# Patient Record
Sex: Female | Born: 1965 | Race: White | Hispanic: No | Marital: Single | Smoking: Never smoker
Health system: Southern US, Community
[De-identification: ages and names within clinical notes are randomized; demographics above are authoritative.]

## PROBLEM LIST (undated history)

## (undated) DIAGNOSIS — M51369 Other intervertebral disc degeneration, lumbar region without mention of lumbar back pain or lower extremity pain: Secondary | ICD-10-CM

## (undated) DIAGNOSIS — E785 Hyperlipidemia, unspecified: Secondary | ICD-10-CM

## (undated) DIAGNOSIS — L88 Pyoderma gangrenosum: Secondary | ICD-10-CM

## (undated) DIAGNOSIS — R011 Cardiac murmur, unspecified: Secondary | ICD-10-CM

## (undated) DIAGNOSIS — L080 Pyoderma: Secondary | ICD-10-CM

## (undated) DIAGNOSIS — M5136 Other intervertebral disc degeneration, lumbar region: Secondary | ICD-10-CM

## (undated) HISTORY — DX: Pyoderma gangrenosum: L88

## (undated) HISTORY — PX: POLYPECTOMY: SHX149

## (undated) HISTORY — DX: Other intervertebral disc degeneration, lumbar region without mention of lumbar back pain or lower extremity pain: M51.369

## (undated) HISTORY — DX: Pyoderma: L08.0

## (undated) HISTORY — PX: ABDOMINAL HERNIA REPAIR: SHX539

## (undated) HISTORY — PX: TONSILLECTOMY: SUR1361

## (undated) HISTORY — DX: Cardiac murmur, unspecified: R01.1

## (undated) HISTORY — DX: Hyperlipidemia, unspecified: E78.5

## (undated) HISTORY — DX: Other intervertebral disc degeneration, lumbar region: M51.36

---

## 2000-04-14 ENCOUNTER — Encounter: Payer: Self-pay | Admitting: Obstetrics and Gynecology

## 2000-04-14 ENCOUNTER — Ambulatory Visit (HOSPITAL_COMMUNITY): Admission: RE | Admit: 2000-04-14 | Discharge: 2000-04-14 | Payer: Self-pay | Admitting: Obstetrics and Gynecology

## 2000-05-31 ENCOUNTER — Other Ambulatory Visit: Admission: RE | Admit: 2000-05-31 | Discharge: 2000-05-31 | Payer: Self-pay | Admitting: Obstetrics and Gynecology

## 2000-06-05 ENCOUNTER — Inpatient Hospital Stay (HOSPITAL_COMMUNITY): Admission: RE | Admit: 2000-06-05 | Discharge: 2000-06-05 | Payer: Self-pay | Admitting: *Deleted

## 2000-10-19 ENCOUNTER — Inpatient Hospital Stay (HOSPITAL_COMMUNITY): Admission: AD | Admit: 2000-10-19 | Discharge: 2000-10-19 | Payer: Self-pay | Admitting: Obstetrics and Gynecology

## 2000-10-20 ENCOUNTER — Inpatient Hospital Stay (HOSPITAL_COMMUNITY): Admission: AD | Admit: 2000-10-20 | Discharge: 2000-10-20 | Payer: Self-pay | Admitting: Obstetrics and Gynecology

## 2000-11-08 ENCOUNTER — Other Ambulatory Visit: Admission: RE | Admit: 2000-11-08 | Discharge: 2000-11-08 | Payer: Self-pay | Admitting: Obstetrics and Gynecology

## 2000-11-17 ENCOUNTER — Inpatient Hospital Stay (HOSPITAL_COMMUNITY): Admission: AD | Admit: 2000-11-17 | Discharge: 2000-11-17 | Payer: Self-pay | Admitting: Obstetrics & Gynecology

## 2000-12-20 ENCOUNTER — Inpatient Hospital Stay (HOSPITAL_COMMUNITY): Admission: AD | Admit: 2000-12-20 | Discharge: 2000-12-22 | Payer: Self-pay | Admitting: Obstetrics and Gynecology

## 2000-12-23 ENCOUNTER — Encounter: Admission: RE | Admit: 2000-12-23 | Discharge: 2001-01-22 | Payer: Self-pay | Admitting: Obstetrics and Gynecology

## 2001-01-27 ENCOUNTER — Other Ambulatory Visit: Admission: RE | Admit: 2001-01-27 | Discharge: 2001-01-27 | Payer: Self-pay | Admitting: Obstetrics and Gynecology

## 2001-06-17 ENCOUNTER — Other Ambulatory Visit: Admission: RE | Admit: 2001-06-17 | Discharge: 2001-06-17 | Payer: Self-pay | Admitting: Obstetrics and Gynecology

## 2001-12-30 ENCOUNTER — Other Ambulatory Visit: Admission: RE | Admit: 2001-12-30 | Discharge: 2001-12-30 | Payer: Self-pay | Admitting: Obstetrics and Gynecology

## 2002-12-22 ENCOUNTER — Encounter: Payer: Self-pay | Admitting: Family Medicine

## 2002-12-22 ENCOUNTER — Encounter: Admission: RE | Admit: 2002-12-22 | Discharge: 2002-12-22 | Payer: Self-pay | Admitting: Family Medicine

## 2002-12-27 ENCOUNTER — Other Ambulatory Visit: Admission: RE | Admit: 2002-12-27 | Discharge: 2002-12-27 | Payer: Self-pay | Admitting: Obstetrics and Gynecology

## 2003-02-13 ENCOUNTER — Encounter: Admission: RE | Admit: 2003-02-13 | Discharge: 2003-02-13 | Payer: Self-pay | Admitting: Obstetrics and Gynecology

## 2003-10-26 ENCOUNTER — Emergency Department (HOSPITAL_COMMUNITY): Admission: EM | Admit: 2003-10-26 | Discharge: 2003-10-26 | Payer: Self-pay | Admitting: Emergency Medicine

## 2004-01-02 ENCOUNTER — Encounter: Admission: RE | Admit: 2004-01-02 | Discharge: 2004-01-02 | Payer: Self-pay | Admitting: Obstetrics and Gynecology

## 2004-10-14 ENCOUNTER — Ambulatory Visit: Payer: Self-pay | Admitting: Gastroenterology

## 2004-11-18 ENCOUNTER — Ambulatory Visit: Payer: Self-pay | Admitting: Gastroenterology

## 2004-12-22 ENCOUNTER — Ambulatory Visit: Payer: Self-pay | Admitting: Gastroenterology

## 2005-01-13 ENCOUNTER — Ambulatory Visit: Payer: Self-pay | Admitting: Gastroenterology

## 2005-03-12 ENCOUNTER — Ambulatory Visit: Payer: Self-pay | Admitting: Gastroenterology

## 2005-03-19 ENCOUNTER — Ambulatory Visit: Payer: Self-pay | Admitting: Gastroenterology

## 2005-04-15 ENCOUNTER — Ambulatory Visit: Payer: Self-pay | Admitting: Gastroenterology

## 2005-06-10 ENCOUNTER — Ambulatory Visit: Payer: Self-pay | Admitting: Gastroenterology

## 2005-09-02 ENCOUNTER — Ambulatory Visit: Payer: Self-pay | Admitting: Gastroenterology

## 2005-11-25 ENCOUNTER — Ambulatory Visit: Payer: Self-pay | Admitting: Gastroenterology

## 2006-02-17 ENCOUNTER — Ambulatory Visit: Payer: Self-pay | Admitting: Gastroenterology

## 2006-05-18 ENCOUNTER — Ambulatory Visit: Payer: Self-pay | Admitting: Gastroenterology

## 2006-06-01 ENCOUNTER — Ambulatory Visit: Payer: Self-pay | Admitting: Gastroenterology

## 2006-08-10 ENCOUNTER — Ambulatory Visit: Payer: Self-pay | Admitting: Gastroenterology

## 2006-08-19 ENCOUNTER — Ambulatory Visit: Payer: Self-pay | Admitting: Gastroenterology

## 2006-08-19 ENCOUNTER — Encounter: Payer: Self-pay | Admitting: Gastroenterology

## 2006-09-06 ENCOUNTER — Ambulatory Visit: Payer: Self-pay | Admitting: Gastroenterology

## 2006-09-20 ENCOUNTER — Ambulatory Visit: Payer: Self-pay | Admitting: Gastroenterology

## 2006-10-07 ENCOUNTER — Ambulatory Visit: Payer: Self-pay | Admitting: Gastroenterology

## 2006-10-15 ENCOUNTER — Ambulatory Visit: Payer: Self-pay | Admitting: Gastroenterology

## 2007-08-10 ENCOUNTER — Telehealth: Payer: Self-pay | Admitting: Gastroenterology

## 2007-12-31 ENCOUNTER — Emergency Department (HOSPITAL_COMMUNITY): Admission: EM | Admit: 2007-12-31 | Discharge: 2007-12-31 | Payer: Self-pay | Admitting: Emergency Medicine

## 2008-06-26 ENCOUNTER — Telehealth: Payer: Self-pay | Admitting: Gastroenterology

## 2008-08-15 ENCOUNTER — Encounter: Admission: RE | Admit: 2008-08-15 | Discharge: 2008-08-15 | Payer: Self-pay | Admitting: Obstetrics and Gynecology

## 2008-08-15 DIAGNOSIS — K5150 Left sided colitis without complications: Secondary | ICD-10-CM | POA: Insufficient documentation

## 2008-08-16 ENCOUNTER — Ambulatory Visit: Payer: Self-pay | Admitting: Gastroenterology

## 2008-08-16 LAB — CONVERTED CEMR LAB
AST: 20 units/L (ref 0–37)
Albumin: 4.2 g/dL (ref 3.5–5.2)
Alkaline Phosphatase: 47 units/L (ref 39–117)
BUN: 13 mg/dL (ref 6–23)
Basophils Relative: 1.1 % (ref 0.0–3.0)
Calcium: 9 mg/dL (ref 8.4–10.5)
Creatinine, Ser: 0.6 mg/dL (ref 0.4–1.2)
Eosinophils Absolute: 0.1 10*3/uL (ref 0.0–0.7)
Eosinophils Relative: 2.1 % (ref 0.0–5.0)
Glucose, Bld: 78 mg/dL (ref 70–99)
HCT: 39.9 % (ref 36.0–46.0)
Hemoglobin: 13.9 g/dL (ref 12.0–15.0)
MCHC: 34.7 g/dL (ref 30.0–36.0)
MCV: 89.4 fL (ref 78.0–100.0)
Monocytes Absolute: 0.3 10*3/uL (ref 0.1–1.0)
Neutro Abs: 4.3 10*3/uL (ref 1.4–7.7)
Potassium: 4 meq/L (ref 3.5–5.1)
RBC: 4.46 M/uL (ref 3.87–5.11)
WBC: 5.8 10*3/uL (ref 4.5–10.5)

## 2008-08-17 ENCOUNTER — Encounter: Payer: Self-pay | Admitting: Gastroenterology

## 2008-09-19 ENCOUNTER — Encounter (INDEPENDENT_AMBULATORY_CARE_PROVIDER_SITE_OTHER): Payer: Self-pay | Admitting: *Deleted

## 2008-09-19 ENCOUNTER — Ambulatory Visit (HOSPITAL_COMMUNITY): Admission: RE | Admit: 2008-09-19 | Discharge: 2008-09-19 | Payer: Self-pay | Admitting: Family Medicine

## 2008-11-21 ENCOUNTER — Telehealth: Payer: Self-pay | Admitting: Gastroenterology

## 2008-11-26 ENCOUNTER — Telehealth: Payer: Self-pay | Admitting: Gastroenterology

## 2008-12-04 ENCOUNTER — Telehealth: Payer: Self-pay | Admitting: Gastroenterology

## 2009-02-05 ENCOUNTER — Ambulatory Visit: Payer: Self-pay | Admitting: Gastroenterology

## 2009-02-06 ENCOUNTER — Encounter: Payer: Self-pay | Admitting: Gastroenterology

## 2009-02-14 ENCOUNTER — Telehealth: Payer: Self-pay | Admitting: Gastroenterology

## 2009-02-27 ENCOUNTER — Ambulatory Visit: Payer: Self-pay | Admitting: Gastroenterology

## 2009-03-04 ENCOUNTER — Telehealth: Payer: Self-pay | Admitting: Gastroenterology

## 2009-03-05 ENCOUNTER — Ambulatory Visit: Payer: Self-pay | Admitting: Gastroenterology

## 2009-03-06 ENCOUNTER — Encounter (INDEPENDENT_AMBULATORY_CARE_PROVIDER_SITE_OTHER): Payer: Self-pay | Admitting: *Deleted

## 2009-03-11 ENCOUNTER — Telehealth: Payer: Self-pay | Admitting: Gastroenterology

## 2009-03-27 ENCOUNTER — Telehealth: Payer: Self-pay | Admitting: Gastroenterology

## 2009-03-27 ENCOUNTER — Ambulatory Visit: Payer: Self-pay | Admitting: Gastroenterology

## 2009-03-27 LAB — CONVERTED CEMR LAB
Albumin: 4 g/dL (ref 3.5–5.2)
Alkaline Phosphatase: 38 units/L — ABNORMAL LOW (ref 39–117)
Basophils Absolute: 0 10*3/uL (ref 0.0–0.1)
CO2: 29 meq/L (ref 19–32)
Chloride: 104 meq/L (ref 96–112)
Creatinine, Ser: 0.7 mg/dL (ref 0.4–1.2)
Eosinophils Absolute: 0.1 10*3/uL (ref 0.0–0.7)
Glucose, Bld: 95 mg/dL (ref 70–99)
Hemoglobin: 13.6 g/dL (ref 12.0–15.0)
Lipase: 16 units/L (ref 11.0–59.0)
Lymphocytes Relative: 14.4 % (ref 12.0–46.0)
MCHC: 34.1 g/dL (ref 30.0–36.0)
Monocytes Relative: 5.8 % (ref 3.0–12.0)
Neutro Abs: 6.4 10*3/uL (ref 1.4–7.7)
Neutrophils Relative %: 77.7 % — ABNORMAL HIGH (ref 43.0–77.0)
Platelets: 252 10*3/uL (ref 150.0–400.0)
RDW: 12.3 % (ref 11.5–14.6)
Total Bilirubin: 0.8 mg/dL (ref 0.3–1.2)

## 2009-03-28 ENCOUNTER — Ambulatory Visit: Payer: Self-pay | Admitting: Gastroenterology

## 2009-05-09 ENCOUNTER — Ambulatory Visit: Payer: Self-pay | Admitting: Gastroenterology

## 2009-05-31 ENCOUNTER — Telehealth: Payer: Self-pay | Admitting: Gastroenterology

## 2009-06-14 ENCOUNTER — Telehealth: Payer: Self-pay | Admitting: Gastroenterology

## 2009-06-18 ENCOUNTER — Ambulatory Visit: Payer: Self-pay | Admitting: Gastroenterology

## 2009-06-18 LAB — CONVERTED CEMR LAB
ALT: 11 units/L (ref 0–35)
AST: 17 units/L (ref 0–37)
Albumin: 4.1 g/dL (ref 3.5–5.2)
Eosinophils Relative: 1.8 % (ref 0.0–5.0)
HCT: 39.2 % (ref 36.0–46.0)
Hemoglobin: 13.8 g/dL (ref 12.0–15.0)
Lymphs Abs: 1 10*3/uL (ref 0.7–4.0)
MCV: 91.6 fL (ref 78.0–100.0)
Monocytes Relative: 6.5 % (ref 3.0–12.0)
Neutro Abs: 3.5 10*3/uL (ref 1.4–7.7)
Total Protein: 7.2 g/dL (ref 6.0–8.3)
WBC: 4.9 10*3/uL (ref 4.5–10.5)

## 2009-07-24 ENCOUNTER — Encounter (INDEPENDENT_AMBULATORY_CARE_PROVIDER_SITE_OTHER): Payer: Self-pay | Admitting: *Deleted

## 2009-08-28 ENCOUNTER — Ambulatory Visit: Payer: Self-pay | Admitting: Gastroenterology

## 2009-09-06 ENCOUNTER — Encounter: Admission: RE | Admit: 2009-09-06 | Discharge: 2009-09-06 | Payer: Self-pay | Admitting: Obstetrics and Gynecology

## 2009-10-14 ENCOUNTER — Telehealth: Payer: Self-pay | Admitting: Gastroenterology

## 2009-10-15 ENCOUNTER — Ambulatory Visit: Payer: Self-pay | Admitting: Gastroenterology

## 2009-10-15 LAB — CONVERTED CEMR LAB
Basophils Relative: 0.8 % (ref 0.0–3.0)
Eosinophils Relative: 2 % (ref 0.0–5.0)
HCT: 39.5 % (ref 36.0–46.0)
Hemoglobin: 13.7 g/dL (ref 12.0–15.0)
Lymphs Abs: 0.7 10*3/uL (ref 0.7–4.0)
MCV: 95.6 fL (ref 78.0–100.0)
Monocytes Absolute: 0.3 10*3/uL (ref 0.1–1.0)
Neutrophils Relative %: 72.8 % (ref 43.0–77.0)
RBC: 4.13 M/uL (ref 3.87–5.11)
Total Bilirubin: 0.8 mg/dL (ref 0.3–1.2)
WBC: 4.3 10*3/uL — ABNORMAL LOW (ref 4.5–10.5)

## 2009-12-23 ENCOUNTER — Telehealth: Payer: Self-pay | Admitting: Gastroenterology

## 2010-02-13 ENCOUNTER — Telehealth: Payer: Self-pay | Admitting: Gastroenterology

## 2010-02-14 ENCOUNTER — Ambulatory Visit: Payer: Self-pay | Admitting: Gastroenterology

## 2010-02-14 LAB — CONVERTED CEMR LAB
Albumin: 4.2 g/dL (ref 3.5–5.2)
Basophils Relative: 1.1 % (ref 0.0–3.0)
Bilirubin, Direct: 0.1 mg/dL (ref 0.0–0.3)
Eosinophils Absolute: 0.1 10*3/uL (ref 0.0–0.7)
MCHC: 35.5 g/dL (ref 30.0–36.0)
MCV: 94.5 fL (ref 78.0–100.0)
Monocytes Absolute: 0.4 10*3/uL (ref 0.1–1.0)
Neutrophils Relative %: 66.2 % (ref 43.0–77.0)
RBC: 4.1 M/uL (ref 3.87–5.11)
Total Protein: 6.9 g/dL (ref 6.0–8.3)

## 2010-02-28 ENCOUNTER — Telehealth: Payer: Self-pay | Admitting: Gastroenterology

## 2010-03-03 ENCOUNTER — Ambulatory Visit: Payer: Self-pay | Admitting: Gastroenterology

## 2010-03-03 LAB — CONVERTED CEMR LAB
Total CHOL/HDL Ratio: 5
VLDL: 20 mg/dL (ref 0.0–40.0)

## 2010-03-11 ENCOUNTER — Telehealth: Payer: Self-pay | Admitting: Gastroenterology

## 2010-04-06 ENCOUNTER — Encounter: Payer: Self-pay | Admitting: Obstetrics and Gynecology

## 2010-04-13 LAB — CONVERTED CEMR LAB
ALT: 13 units/L (ref 0–35)
AST: 20 units/L (ref 0–37)
Albumin: 3.7 g/dL (ref 3.5–5.2)
Amylase: 155 units/L — ABNORMAL HIGH (ref 27–131)
BUN: 14 mg/dL (ref 6–23)
Basophils Relative: 0.5 % (ref 0.0–3.0)
Chloride: 102 meq/L (ref 96–112)
Eosinophils Relative: 1.4 % (ref 0.0–5.0)
GFR calc non Af Amer: 83.72 mL/min (ref >60)
HCT: 40.1 % (ref 36.0–46.0)
Hemoglobin: 13.7 g/dL (ref 12.0–15.0)
Lipase: 14 units/L (ref 11.0–59.0)
Lymphs Abs: 1.3 10*3/uL (ref 0.7–4.0)
MCV: 94.1 fL (ref 78.0–100.0)
Monocytes Absolute: 0.4 10*3/uL (ref 0.1–1.0)
Monocytes Relative: 3.2 % (ref 3.0–12.0)
Neutro Abs: 9.3 10*3/uL — ABNORMAL HIGH (ref 1.4–7.7)
Potassium: 3.7 meq/L (ref 3.5–5.1)
Sodium: 138 meq/L (ref 135–145)
Total Protein: 6.8 g/dL (ref 6.0–8.3)
WBC: 11.3 10*3/uL — ABNORMAL HIGH (ref 4.5–10.5)

## 2010-04-15 NOTE — Progress Notes (Signed)
Summary: 6 month labs due   Phone Note Outgoing Call Call back at [facility] Phone [phone]   Call placed by: Genella Mech CMA Deborra Medina),  February 13, 2010 3:14 PM Summary of Call: Called pt to inform that her 6 month labs are due. L/M for her to contact the office if she has any questions Initial call taken by: Genella Mech CMA ([place]),  February 13, 2010 3:15 PM     Appended Document: 6 month labs due ORDERS IN IDX

## 2010-04-15 NOTE — Letter (Signed)
Summary: Office Visit Letter  [facility]  [address] [place], [place] [address]   Phone: [phone]  Fax: [phone]      Jul 24, 2009 MRN: [id number]   [facility] [address] [place], [place]  [address]   Dear Ms. Lua,   According to our records, it is time for you to schedule a follow-up office visit with [place] in the month of June 2011.   At your convenience, please call [phone] (option #2)to schedule an office visit. If you have any questions, concerns, or feel that this letter is in error, we would appreciate your call.   Sincerely,  Sandy Salaam. Deatra Ina, M.D.   [facility] [phone]

## 2010-04-15 NOTE — Assessment & Plan Note (Signed)
Summary: FOLLOW UP APPT/YF    History of Present Illness Visit Type: Follow-up Visit Primary GI MD: Erskine Emery MD [facility] Primary Provider: Elson Clan, MD Requesting Provider: n/a Chief Complaint: Renew lialda, no colitis flare History of Present Illness:   [name] has returned for followup for left-sided ulcerative colitis.  On a regimen of 6 MP 75 mg daily and lialda 4.8 g daily she has remained in clinical remission.  Altogether she is feeling quite well.  Blood work including LFTs and CBC are normal.   GI Review of Systems      Denies abdominal pain, acid reflux, belching, bloating, chest pain, dysphagia with liquids, dysphagia with solids, heartburn, loss of appetite, nausea, vomiting, vomiting blood, weight loss, and  weight gain.        Denies anal fissure, black tarry stools, change in bowel habit, constipation, diarrhea, diverticulosis, fecal incontinence, heme positive stool, hemorrhoids, irritable bowel syndrome, jaundice, light color stool, liver problems, rectal bleeding, and  rectal pain.    Current Medications (verified): 1)  Lialda 1.2 Gm  Tbec (Mesalamine) .... Take 4  Caps Once Daily...must Have Office Visit Before Any More Refills!!! 2)  Nortrel 0.5/35 (28) 0.5-35 Mg-Mcg Tabs (Norethindrone-Eth Estradiol) .... One Tablet By Mouth Once Daily 3)  Tylenol 325 Mg Tabs (Acetaminophen) .... As Needed For Pain 4)  Prednisone 10 Mg Tabs (Prednisone) .... Take 4 Tablets Q.a.m With Food Tapering Down 5)  Mercaptopurine 50 Mg Tabs (Mercaptopurine) .Marland Kitchen.. 1 1/2 By Mouth Once Daily   (Has Increased To 48m )  Allergies (verified): No Known Drug Allergies  Past History:  Past Medical History: Reviewed history from 08/15/2008 and no changes required. Current Problems:  LEFT SIDED ULCERATIVE COLITIS (ICD-556.5)  Past Surgical History: Reviewed history from 08/16/2008 and no changes required. Tonsillectomy Hernia surgery  Family History: Reviewed history from  08/16/2008 and no changes required. Family History of Breast Cancer:Mother Family History of Colon Polyps:Mother No FH of Colon Cancer:  Social History: Reviewed history from 02/05/2009 and no changes required. 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  Alcohol Use - no Daily Caffeine Use Illicit Drug Use - no  Review of Systems       The patient complains of heart murmur.  The patient denies allergy/sinus, anemia, anxiety-new, arthritis/joint pain, back pain, blood in urine, breast changes/lumps, change in vision, confusion, cough, coughing up blood, depression-new, fainting, fatigue, fever, headaches-new, hearing problems, heart rhythm changes, itching, menstrual pain, muscle pains/cramps, night sweats, nosebleeds, pregnancy symptoms, shortness of breath, skin rash, sleeping problems, sore throat, swelling of feet/legs, swollen lymph glands, thirst - excessive , urination - excessive , urination changes/pain, urine leakage, vision changes, and voice change.    Vital Signs:  Patient profile:   45year old female Height:      64 inches Weight:      143.50 pounds BMI:     24.72 Pulse rate:   68 / minute Pulse rhythm:   regular BP sitting:   106 / 60  (left arm) Cuff size:   regular  Vitals Entered By: June McMurray CHarmon(Deborra Medina (August 28, 2009 3:18 PM)   Impression & Recommendations:  Problem # 1:  LEFT SIDED ULCERATIVE COLITIS (ICD-556.5) Assessment Improved She is in clinical remission on lialda and 6-MP.  Plan to continue current regimen.  Patient Instructions: 1)  Copy sent to : DElson Clan MD 2)  Your refills of Lialda will be sent to your pharmacy 3)  You will follow up labs  in Aug 2011 and again in Dec. 2011 4)  We will contact you with reminder appointments 5)  You will need to follow up again with Dr Deatra Ina in the office after December 6)  The medication list was reviewed and reconciled.  All changed / newly prescribed medications were explained.  A  complete medication list was provided to the patient / caregiver. Prescriptions: LIALDA 1.2 GM  TBEC (MESALAMINE) take 4  caps once daily...  #120 x 6   Entered by:   Genella Mech CMA ([place])   Authorized by:   Inda Castle MD   Signed by:   Genella Mech CMA ([place]) on 08/28/2009   Method used:   Electronically to        [facility] (retail)       [address], [place]  [address]       Ph: [phone]       Fax: [phone]   RxID:   [id number]

## 2010-04-15 NOTE — Assessment & Plan Note (Signed)
Summary: F/U APPT...LSW.    History of Present Illness Visit Type: Follow-up Visit Primary GI MD: Erskine Emery MD [facility] Primary Provider: Elson Clan, MD Requesting Provider: n/a Chief Complaint: follow-up pt. denies any problems at this time History of Present Illness:   Mrs. Wyrick has returned for followup of her left-sided ulcerative colitis.  6 MP 50 mg daily and lialda she has remained in complete remission.  She developed abdominal pain with Imuran.   GI Review of Systems      Denies abdominal pain, acid reflux, belching, bloating, chest pain, dysphagia with liquids, dysphagia with solids, heartburn, loss of appetite, nausea, vomiting, vomiting blood, weight loss, and  weight gain.        Denies anal fissure, black tarry stools, change in bowel habit, constipation, diarrhea, diverticulosis, fecal incontinence, heme positive stool, hemorrhoids, irritable bowel syndrome, jaundice, light color stool, liver problems, rectal bleeding, and  rectal pain.    Current Medications (verified): 1)  Lialda 1.2 Gm  Tbec (Mesalamine) .... Take 4  Caps Once Daily 2)  Nortrel 0.5/35 (28) 0.5-35 Mg-Mcg Tabs (Norethindrone-Eth Estradiol) .... One Tablet By Mouth Once Daily 3)  Tylenol 325 Mg Tabs (Acetaminophen) .... As Needed For Pain 4)  Prednisone 10 Mg Tabs (Prednisone) .... Take 4 Tablets Q.a.m With Food Tapering Down 5)  Imuran 50 Mg Tabs (Azathioprine) .... Take One Tab Daily (Holding) 6)  Mercaptopurine 50 Mg Tabs (Mercaptopurine) .... Date One Half Tablet Daily For One Week Then Increase To One Daily  Allergies (verified): No Known Drug Allergies  Past History:  Past Medical History: Reviewed history from 08/15/2008 and no changes required. Current Problems:  LEFT SIDED ULCERATIVE COLITIS (ICD-556.5)  Past Surgical History: Reviewed history from 08/16/2008 and no changes required. Tonsillectomy Hernia surgery  Family History: Reviewed history from 08/16/2008 and no changes  required. Family History of Breast Cancer:Mother Family History of Colon Polyps:Mother No FH of Colon Cancer:  Social History: Reviewed history from 02/05/2009 and no changes required. 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  Alcohol Use - no Daily Caffeine Use Illicit Drug Use - no  Review of Systems       The patient complains of fatigue.  The patient denies allergy/sinus, anemia, anxiety-new, arthritis/joint pain, back pain, blood in urine, breast changes/lumps, change in vision, confusion, cough, coughing up blood, depression-new, fainting, fever, headaches-new, hearing problems, heart murmur, heart rhythm changes, itching, menstrual pain, muscle pains/cramps, night sweats, nosebleeds, pregnancy symptoms, shortness of breath, skin rash, sleeping problems, sore throat, swelling of feet/legs, swollen lymph glands, thirst - excessive, urination - excessive, urination changes/pain, urine leakage, vision changes, and voice change.    Vital Signs:  Patient profile:   45 year old female Height:      64 inches Weight:      152 pounds BMI:     26.19 Pulse rate:   72 / minute Pulse rhythm:   regular BP sitting:   102 / 72  (right arm)  Vitals Entered By: Randye Lobo NCMA (May 09, 2009 9:09 AM)    Impression & Recommendations:  Problem # 1:  LEFT SIDED ULCERATIVE COLITIS (ICD-556.5) Assessment Improved Plan to increase 6-MP to 75 mg daily while continuing lialda

## 2010-04-15 NOTE — Progress Notes (Signed)
Summary: LABS DUE   Phone Note Outgoing Call Call back at [facility] Phone [phone]   Call placed by: Genella Mech CMA [facility]),  June 14, 2009 11:15 AM Summary of Call: CALLED PT TO INFORM LABS ARE DUE. ORDERS IN IDX Initial call taken by: Genella Mech CMA ([place]),  June 14, 2009 11:16 AM

## 2010-04-15 NOTE — Progress Notes (Signed)
Summary: Refill   Phone Note Refill Request Message from:  Fax from Pharmacy on December 23, 2009 3:47 PM  Refills Requested: Medication #1:  MERCAPTOPURINE 50 MG TABS 1 1/2 by mouth once daily   (has increased to 1m ).   Dosage confirmed as above?Dosage Confirmed   Brand Name Necessary? No   Supply Requested: 3 months Initial call taken by: RGenella MechCMA (Deborra Medina,  December 23, 2009 3:48 PM    Prescriptions: MERCAPTOPURINE 50 MG TABS (MERCAPTOPURINE) 1 1/2 by mouth once daily   (has increased to 726m)  #135 x 1   Entered by:   RoGenella MechMA (AAEug[place]  Authorized by:   RoInda CastleD   Signed by:   RoGenella MechMA (AAMexican Colonyon 12/23/2009   Method used:   Electronically to        [facility](retail)       [address]      [place]  [id number]     Ph: [phone]     Fax: [phone] RxID:   [id number]

## 2010-04-15 NOTE — Progress Notes (Signed)
Summary: Follow lab reminder   Phone Note Outgoing Call Call back at Home Phone [phone]   Call placed by: Genella Mech CMA Deborra Medina),  October 14, 2009 10:52 AM Summary of Call: Called pt to inform that follow up labs are due, CBC and LFT's. Orders in [place]. L/M for pt. Initial call taken by: Genella Mech CMA [facility]),  October 14, 2009 10:52 AM

## 2010-04-15 NOTE — Assessment & Plan Note (Signed)
Summary: F/U FROM TRIAGE ON 03-04-09 AND RECENT LABS              [place]    History of Present Illness Visit Type: Follow-up Visit Primary GI MD: Erskine Emery MD [facility] Primary Provider: Elson Clan, MD Requesting Provider: n/a Chief Complaint: follow-up labs Pt. is better at this time History of Present Illness:   Ms. Camplin has returned for followup of her left-sided colitis.  She developed upper abdominal pain with Imuran and had to discontinue this.  She has continued her steroid taper and her colitis has remained in remission.  her TPMT. phenotype low normal.   GI Review of Systems      Denies abdominal pain, acid reflux, belching, bloating, chest pain, dysphagia with liquids, dysphagia with solids, heartburn, loss of appetite, nausea, vomiting, vomiting blood, weight loss, and  weight gain.        Denies anal fissure, black tarry stools, change in bowel habit, constipation, diarrhea, diverticulosis, fecal incontinence, heme positive stool, hemorrhoids, irritable bowel syndrome, jaundice, light color stool, liver problems, rectal bleeding, and  rectal pain.    Current Medications (verified): 1)  Lialda 1.2 Gm  Tbec (Mesalamine) .... Take 4  Caps Once Daily 2)  Nortrel 0.5/35 (28) 0.5-35 Mg-Mcg Tabs (Norethindrone-Eth Estradiol) .... One Tablet By Mouth Once Daily 3)  Tylenol 325 Mg Tabs (Acetaminophen) .... As Needed For Pain 4)  Prednisone 10 Mg Tabs (Prednisone) .... Take 4 Tablets Q.a.m With Food Tapering Down 5)  Imuran 50 Mg Tabs (Azathioprine) .... Take One Tab Daily (Holding)  Allergies (verified): No Known Drug Allergies  Past History:  Past Medical History: Reviewed history from 08/15/2008 and no changes required. Current Problems:  LEFT SIDED ULCERATIVE COLITIS (ICD-556.5)  Past Surgical History: Reviewed history from 08/16/2008 and no changes required. Tonsillectomy Hernia surgery  Family History: Reviewed history from 08/16/2008 and no changes  required. Family History of Breast Cancer:Mother Family History of Colon Polyps:Mother No FH of Colon Cancer:  Social History: Reviewed history from 02/05/2009 and no changes required. 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  Alcohol Use - no Daily Caffeine Use Illicit Drug Use - no  Review of Systems  The patient denies allergy/sinus, anemia, anxiety-new, arthritis/joint pain, back pain, blood in urine, breast changes/lumps, change in vision, confusion, cough, coughing up blood, depression-new, fainting, fatigue, fever, headaches-new, hearing problems, heart murmur, heart rhythm changes, itching, menstrual pain, muscle pains/cramps, night sweats, nosebleeds, pregnancy symptoms, shortness of breath, skin rash, sleeping problems, sore throat, swelling of feet/legs, swollen lymph glands, thirst - excessive , urination - excessive , urination changes/pain, urine leakage, vision changes, and voice change.    Vital Signs:  Patient profile:   45 year old female Height:      64 inches Weight:      149.38 pounds BMI:     25.73 Pulse rate:   80 / minute Pulse rhythm:   regular BP sitting:   108 / 68  (left arm)  Vitals Entered By: Randye Lobo [place] (March 28, 2009 10:30 AM)   Impression & Recommendations:  Problem # 1:  LEFT SIDED ULCERATIVE COLITIS (ICD-556.5) Assessment Improved Patient is intolerant of Imuran(abdominal pain).  Plan to start 6-MP 25 mg and increase it to 50 mg after one week provided that she tolerates this.  Failing  this, I would start her on Remicade.  She will continue lialda.  Patient Instructions: 1)  CC Dr. Elson Clan Prescriptions: MERCAPTOPURINE 50 MG TABS (MERCAPTOPURINE) date one  half tablet daily for one week then increase to one daily  #25 x 5   Entered and Authorized by:   Inda Castle MD   Signed by:   Inda Castle MD on 03/28/2009   Method used:   Electronically to        [facility] (retail)        [address]       [place]       [place], [place]  [address]       Ph: [phone]       Fax: [phone]   RxID:   [id number]

## 2010-04-15 NOTE — Progress Notes (Signed)
Summary: 6MP refill request  Medications Added MERCAPTOPURINE 50 MG TABS (MERCAPTOPURINE) 1 1/2 by mouth once daily   (has increased to 11m )       Phone Note Refill Request Message from:  Fax from Pharmacy on May 31, 2009 8:33 AM  Refills Requested: Medication #1:  MERCAPTOPURINE 50 MG TABS date one half tablet daily for one week then increase to one daily.   Dosage confirmed as above?Dosage Confirmed   Brand Name Necessary? No   Supply Requested: 6 months  Method Requested: Electronic Initial call taken by: RGenella MechCMA (Deborra Medina,  May 31, 2009 8:33 AM    New/Updated Medications: MERCAPTOPURINE 50 MG TABS (MERCAPTOPURINE) 1 1/2 by mouth once daily   (has increased to 753m) Prescriptions: MERCAPTOPURINE 50 MG TABS (MERCAPTOPURINE) 1 1/2 by mouth once daily   (has increased to 7559m  #45 x 6   Entered by:   RobGenella MechA (AAMS[place] Authorized by:   RobInda Castle   Signed by:   RobGenella MechA (AAMFessendenn 05/31/2009   Method used:   Electronically to        [facility]       [address]     [place]  [id number]    Ph: [phone]    Fax: [phone]:   [id number]

## 2010-04-15 NOTE — Progress Notes (Signed)
Summary: Follow up labs   Phone Note Outgoing Call Call back at Home Phone [phone]   Call placed by: Genella Mech CMA Deborra Medina),  March 27, 2009 12:15 PM Summary of Call: Called pt to remind her to have her follow up labs drawn before her appointment on the 13th. Orders in [place] Initial call taken by: Genella Mech CMA Deborra Medina),  March 27, 2009 12:15 PM

## 2010-04-17 NOTE — Progress Notes (Signed)
Summary: Results   Phone Note Call from Patient Call back at [phone]   Reason for Call: Talk to Nurse Summary of Call: Pt wants to speak with Robin about lab results Initial call taken by: Martinique Johnson,  February 28, 2010 10:41 AM  Follow-up for Phone Call        [place] over normal lab results with pt. Pt wants to have a lipid panel drawn, she had elevated cholesterol during her medical insurance exam. She has never had high cholesterol before. Wants to know if Dr Deatra Ina would mind putting in an order for her...  Dr Deatra Ina, FYI-I put an order in [place] for her to come back to the lab to have her Fasting Lipid panel drawn.  She wants call back with results.. Follow-up by: Genella Mech CMA Deborra Medina),  February 28, 2010 10:51 AM  Additional Follow-up for Phone Call Additional follow up Details #1::        ok Additional Follow-up by: Inda Castle MD,  March 03, 2010 10:07 AM

## 2010-04-17 NOTE — Progress Notes (Signed)
Summary: results   Phone Note Call from Patient Call back at [phone]   Caller: Patient Call For: Dr Deatra Ina Reason for Call: Lab or Test Results Summary of Call: Lab results Initial call taken by: Ronalee Red,  March 11, 2010 10:16 AM  Follow-up for Phone Call        Patient is advised of cholesterol  lab results. Rosanne Sack RN  March 11, 2010 2:24 PM

## 2010-04-22 ENCOUNTER — Encounter: Payer: Self-pay | Admitting: Gastroenterology

## 2010-05-01 NOTE — Medication Information (Signed)
Summary: Doristine Johns / [place]  Lialda / [place]   Imported By: Rise Patience 04/25/2010 16:19:00  _____________________________________________________________________  External Attachment:    Type:   Image     Comment:   External Document

## 2010-07-28 ENCOUNTER — Telehealth: Payer: Self-pay | Admitting: Gastroenterology

## 2010-07-28 MED ORDER — ROWASA 4 G RE KIT: 1.0000 | PACK | Freq: Every evening | RECTAL | Status: DC | PRN

## 2010-07-28 MED ORDER — HYDROCORTISONE ACE-PRAMOXINE 1-1 % RE FOAM: 1.0000 | RECTAL | Status: DC

## 2010-07-28 NOTE — Telephone Encounter (Signed)
The patient has left-sided ulcerative colitis. This is probably a mild flare.  To her regimen and Rowasa enemas one each bedtime and ProctoFoam one every morning. Have patient call back in 4 days to report her progress. Office visit next 2-3 weeks

## 2010-07-28 NOTE — Telephone Encounter (Signed)
Pt aware of Dr. Kelby Fam recommendations and prescriptions sent to the pharmacy. Pt scheduled to see Dr. Deatra Ina 08/14/10@2 :30pm.

## 2010-07-28 NOTE — Telephone Encounter (Signed)
Pt states that she has history of ulcerative colitis. She is currently taking lialda and 15m and states she was in remission. Pt states that last week she started having some bleeding and she is now having more frequent bowel movements with some mucous and blood in them. She is also cramping. Dr. KDeatra Inaplease advise.

## 2010-07-28 NOTE — Telephone Encounter (Signed)
Addended by: Algernon Huxley. on: 07/28/2010 04:48 PM   Modules accepted: Orders

## 2010-08-01 NOTE — H&P (Signed)
[facility]  Patient:    [name], [name] Visit Number: [id number] MRN: [id number]          Service Type: Attending:  Lovenia Kim, M.D. Dictated by:   Lovenia Kim, M.D. Adm. Date:  12/20/00   CC:         [facility]   History and Physical  CHIEF COMPLAINT:              Spontaneous rupture of membranes and early labor at 36 weeks.  HISTORY OF PRESENT ILLNESS:   The patient is a 45 year old white female, G2, P67, EDD January 15, 2001, at 36+ weeks, who presents in spontaneous labor.  ALLERGIES:                    CODEINE.  MEDICATIONS:                  Prenatal vitamins.  GYNECOLOGICAL/OBSTETRICAL HISTORY:                      Previous vaginal delivery in 1998 of a 6 pound 10 ounce female.  History of LEEP in August 2000.  Pregnancy complicated by preterm cervical change, on terbutaline tocolysis up until 36 weeks.  PRENATAL LABORATORY DATA:     Blood type A negative.  Rubella immune. Hepatitis B surface antigen negative.  HIV nonreactive.  GBS reported is negative.  FAMILY HISTORY:               Remarkable for hypertension, DVT, emphysema.  PAST MEDICAL HISTORY:         History of pyelonephritis at age 89, tonsillectomy at age 73, hernia repair at age 62.  Previous history of mitral valve prolapse; no history of premedication prior to vaginal delivery.  PHYSICAL EXAMINATION:  GENERAL:                      She is a well-developed, well-nourished white female in no apparent distress.  HEENT:                        Normal.  LUNGS:                        Clear.  HEART:                        Regular rate and rhythm.  ABDOMEN:                      Soft, nontender, estimated fetal weight is 6 to 6-1/2 pounds.  PELVIC:                       Cervix is 2-3 cm, 80%, vertex -2.  EXTREMITIES:                  Reveals no cords.  NEUROLOGIC EXAM:              Nonfocal.  IMPRESSION:                   1. A 36-week intrauterine  pregnancy                               2. Spontaneous rupture of membranes.  PLAN:  Anticipate attempts at vaginal delivery. Dictated by:   Lovenia Kim, M.D. Attending:  Lovenia Kim, M.D. DD:  12/20/00 TD:  12/20/00 Job: [id number]

## 2010-08-05 ENCOUNTER — Other Ambulatory Visit: Payer: Self-pay | Admitting: Obstetrics and Gynecology

## 2010-08-05 DIAGNOSIS — Z1231 Encounter for screening mammogram for malignant neoplasm of breast: Secondary | ICD-10-CM

## 2010-08-14 ENCOUNTER — Ambulatory Visit (INDEPENDENT_AMBULATORY_CARE_PROVIDER_SITE_OTHER): Payer: 59 | Admitting: Gastroenterology

## 2010-08-14 ENCOUNTER — Other Ambulatory Visit (INDEPENDENT_AMBULATORY_CARE_PROVIDER_SITE_OTHER): Payer: 59

## 2010-08-14 ENCOUNTER — Encounter: Payer: Self-pay | Admitting: Gastroenterology

## 2010-08-14 DIAGNOSIS — K5190 Ulcerative colitis, unspecified, without complications: Secondary | ICD-10-CM

## 2010-08-14 DIAGNOSIS — K5150 Left sided colitis without complications: Secondary | ICD-10-CM

## 2010-08-14 LAB — HEPATIC FUNCTION PANEL
ALT: 13 U/L (ref 0–35)
AST: 13 U/L (ref 0–37)
Alkaline Phosphatase: 39 U/L (ref 39–117)
Bilirubin, Direct: 0.1 mg/dL (ref 0.0–0.3)
Total Protein: 6.9 g/dL (ref 6.0–8.3)

## 2010-08-14 LAB — CBC WITH DIFFERENTIAL/PLATELET
Basophils Relative: 1.3 % (ref 0.0–3.0)
Eosinophils Absolute: 0.1 10*3/uL (ref 0.0–0.7)
Eosinophils Relative: 1.6 % (ref 0.0–5.0)
Hemoglobin: 13 g/dL (ref 12.0–15.0)
Lymphocytes Relative: 19 % (ref 12.0–46.0)
Monocytes Relative: 6.9 % (ref 3.0–12.0)
Neutro Abs: 4.1 10*3/uL (ref 1.4–7.7)
Neutrophils Relative %: 71.2 % (ref 43.0–77.0)
RBC: 3.98 Mil/uL (ref 3.87–5.11)
WBC: 5.8 10*3/uL (ref 4.5–10.5)

## 2010-08-14 NOTE — Progress Notes (Signed)
History of Present Illness:  Ms. Nunley is a 45 year old white female with left-sided ulcer colitis on 6-MP and lialda who is returning for followup of a recent flare. Approximately 2 weeks ago she developed increasing diarrhea with little bleeding. Rowasa and ProctoFoam enemas were added. Symptoms have since quieted and she's back to her baseline. She is without pain, diarrhea or bleeding.    Review of Systems: Pertinent positive and negative review of systems were noted in the above HPI section. All other review of systems were otherwise negative.    Current Medications, Allergies, Past Medical History, Past Surgical History, Family History and Social History were reviewed in [place] record  Vital signs were reviewed in today's medical record. Physical Exam: General: Well developed , well nourished, no acute distress Head: Normocephalic and atraumatic Eyes:  sclerae anicteric, EOMI Ears: Normal auditory acuity Mouth: No deformity or lesions Lungs: Clear throughout to auscultation Heart: Regular rate and rhythm; no murmurs, rubs or bruits Abdomen: Soft, non tender and non distended. No masses, hepatosplenomegaly or hernias noted. Normal Bowel sounds Rectal:deferred Musculoskeletal: Symmetrical with no gross deformities  Pulses:  Normal pulses noted Extremities: No clubbing, cyanosis, edema or deformities noted Neurological: Alert oriented x 4, grossly nonfocal Psychological:  Alert and cooperative. Normal mood and affect

## 2010-08-14 NOTE — Patient Instructions (Signed)
You will have labs today and in 6 months Please schedule a follow up appointment for 1 year

## 2010-08-14 NOTE — Assessment & Plan Note (Signed)
Stable left-sided colitis with recent flare responsive to Rowasa and ProctoFoam enemas.  Recommendations #1 continue lialda and 6-MP #2 check CBC and LFTs every 6 months

## 2010-08-27 ENCOUNTER — Other Ambulatory Visit: Payer: Self-pay | Admitting: Obstetrics and Gynecology

## 2010-08-27 DIAGNOSIS — N6320 Unspecified lump in the left breast, unspecified quadrant: Secondary | ICD-10-CM

## 2010-09-03 ENCOUNTER — Ambulatory Visit
Admission: RE | Admit: 2010-09-03 | Discharge: 2010-09-03 | Disposition: A | Discharge status: Home / Self Care | Payer: 59 | Source: Ambulatory Visit | Attending: Obstetrics and Gynecology | Admitting: Obstetrics and Gynecology

## 2010-09-03 DIAGNOSIS — N6320 Unspecified lump in the left breast, unspecified quadrant: Secondary | ICD-10-CM

## 2010-09-08 ENCOUNTER — Ambulatory Visit: Payer: Self-pay

## 2010-11-18 ENCOUNTER — Other Ambulatory Visit: Payer: Self-pay | Admitting: Gastroenterology

## 2010-11-19 ENCOUNTER — Telehealth: Payer: Self-pay | Admitting: Gastroenterology

## 2010-11-19 NOTE — Telephone Encounter (Signed)
Pt aware.

## 2010-11-19 NOTE — Telephone Encounter (Signed)
I will have research contact her regarding a UC study

## 2010-11-19 NOTE — Telephone Encounter (Signed)
Pt states she is having a flare with her Ulcerative colitis. States that it started on Thursday, she had some Rowasa enemas left from last time but she only had 3, she is out now. States she is having cramping, bright red blood and going to the bathroom about 4-5 times per day. Pt was last seen 08/14/10. Dr. Deatra Ina please advise.

## 2010-11-21 ENCOUNTER — Telehealth: Payer: Self-pay | Admitting: Gastroenterology

## 2010-11-21 MED ORDER — HYDROCORTISONE ACE-PRAMOXINE 1-1 % RE FOAM: 1.0000 | Freq: Every day | RECTAL | Status: DC

## 2010-11-21 MED ORDER — MESALAMINE 4 G RE ENEM: 4.0000 g | ENEMA | Freq: Every day | RECTAL | Status: DC

## 2010-11-21 NOTE — Telephone Encounter (Signed)
Pt aware of Dr. Kelby Fam recommendations.

## 2010-11-21 NOTE — Telephone Encounter (Signed)
Spoke with [name] and she cannot do the study for colitis, she just started a new job and cannot take the time off from work right now. Pt states she is having about 5 loose, bloody stools a day. She is having cramping and just "feels raw." Pt would like something for the flare. Please advise.

## 2010-11-21 NOTE — Telephone Encounter (Signed)
Begin Rowasa enemas each bedtime and ProctoFoam enemas every morning for 2 weeks.  Patient should call back if she's not improved by early next week

## 2011-02-27 ENCOUNTER — Telehealth: Payer: Self-pay | Admitting: Gastroenterology

## 2011-02-27 MED ORDER — PREDNISONE 20 MG PO TABS: 40.0000 mg | ORAL_TABLET | Freq: Every day | ORAL | Status: AC

## 2011-02-27 NOTE — Telephone Encounter (Signed)
Pt aware. She will call back Tuesday. Pt states she will schedule an appt then.

## 2011-02-27 NOTE — Telephone Encounter (Signed)
Pt states she is having a UC flare. She is under a lot of stress, her father is dying from cancer and hospice has been called in. Pt states that she is taking 6MP and Lialda. She is also using the Rowasa and Proctofoam but states she is having a hard time because it keeps coming right out. She is having some solid stool but there is a lot of blood and mucous in the stool. She also reports a lot of gas. Dr. Deatra Ina please advise.

## 2011-02-27 NOTE — Telephone Encounter (Signed)
Begin prednisone 40 mg daily. Asked her to call back on Tuesday. She will need a followup appointment. I would have Dr. Hilarie Fredrickson see the patient and let him determine whether we can start tapering prednisone after hearing from her on Tuesday.

## 2011-03-05 ENCOUNTER — Telehealth: Payer: Self-pay | Admitting: Gastroenterology

## 2011-03-05 NOTE — Telephone Encounter (Signed)
Pt called to let us know her U/C is better after starting the prednisone. Pt scheduled for a follow-up appt with Dr. Hilarie Fredrickson to address tapering off of Prednisone. Pt scheduled 03/12/11@9am . Pt aware of appt date and time.

## 2011-03-12 ENCOUNTER — Ambulatory Visit (INDEPENDENT_AMBULATORY_CARE_PROVIDER_SITE_OTHER): Payer: BC Managed Care – PPO | Admitting: Internal Medicine

## 2011-03-12 ENCOUNTER — Encounter: Payer: Self-pay | Admitting: Internal Medicine

## 2011-03-12 ENCOUNTER — Other Ambulatory Visit (INDEPENDENT_AMBULATORY_CARE_PROVIDER_SITE_OTHER): Payer: BC Managed Care – PPO

## 2011-03-12 VITALS — BP 108/60 | HR 64 | Ht 64.0 in | Wt 151.0 lb

## 2011-03-12 DIAGNOSIS — K5150 Left sided colitis without complications: Secondary | ICD-10-CM

## 2011-03-12 LAB — CBC WITH DIFFERENTIAL/PLATELET
Basophils Relative: 0.1 % (ref 0.0–3.0)
Eosinophils Relative: 0.3 % (ref 0.0–5.0)
Hemoglobin: 13.4 g/dL (ref 12.0–15.0)
Lymphocytes Relative: 6.5 % — ABNORMAL LOW (ref 12.0–46.0)
Monocytes Relative: 4.5 % (ref 3.0–12.0)
Neutro Abs: 10.3 10*3/uL — ABNORMAL HIGH (ref 1.4–7.7)
RBC: 4.02 Mil/uL (ref 3.87–5.11)

## 2011-03-12 LAB — BASIC METABOLIC PANEL
CO2: 29 mEq/L (ref 19–32)
Calcium: 8.9 mg/dL (ref 8.4–10.5)
GFR: 100.02 mL/min (ref >60.00)
Sodium: 136 mEq/L (ref 135–145)

## 2011-03-12 MED ORDER — MESALAMINE 1.2 G PO TBEC: DELAYED_RELEASE_TABLET | ORAL | Status: DC

## 2011-03-12 MED ORDER — MERCAPTOPURINE 50 MG PO TABS: ORAL_TABLET | ORAL | Status: DC

## 2011-03-12 NOTE — Patient Instructions (Signed)
Go directly to the basement to have your labs drawn today. Your prescription refills have been printed for your to mail. Decrease prednisone to 30 mg tablets x 7 days, then 25 mg tablets by mouth x 7 days, then 20 mg tablets x 7 days, then 15 mg tablets x 7 days, then 10 mg tablets x 7 days, then 5 tablets x 7 days, then stop.  Follow up with Dr. Deatra Ina in 6 weeks.  cc: Elson Clan, MD

## 2011-03-12 NOTE — Progress Notes (Addendum)
Subjective:    Patient ID: [name], female    DOB: 1965/11/13, 45 y.o.   MRN: [id number]  HPI Mrs. Tegethoff is a 45 yo female who is a patient of Dr. Deatra Ina with a long-standing history of ulcerative colitis seen in followup after recent flare. The patient was started on prednisone 40 mg daily about 10 days ago. Prior to this she was experiencing lower abdominal cramping pain, along with bloody diarrhea. She also reported lower abdominal and rectal burning. She reports since starting prednisone therapy her symptoms are much improved. Her stools are now formed, though she is still occasionally seeing blood. She is having no further abdominal pain or cramping. No fevers or chills. No nausea or vomiting. She has remained on Lialda 4.8g daily, 6-mp 75 mg daily. She is not using Rowasa or Proctofoam at this time.  Review of Systems Constitutional: Negative for fever, chills, night sweats, activity change, appetite change and unexpected weight change HEENT: Negative for sore throat, mouth sores and trouble swallowing. Eyes: Negative for visual disturbance Respiratory: Negative for cough, chest tightness and shortness of breath Cardiovascular: Negative for chest pain, palpitations and lower extremity swelling Gastrointestinal: See history of present illness Genitourinary: Negative for dysuria and hematuria. Musculoskeletal: Negative for back pain, arthralgias and myalgias Skin: Negative for rash or color change Neurological: Negative for headaches, weakness, numbness Hematological: Negative for adenopathy, negative for easy bruising/bleeding Psychiatric/behavioral: Negative for depressed mood, negative for anxiety  Patient Active Problem List  Diagnoses  . LEFT SIDED ULCERATIVE COLITIS   Current Outpatient Prescriptions  Medication Sig Dispense Refill  . PREDNISONE, PAK, PO Take by mouth.        . hydrocortisone-pramoxine (PROCTOFOAM-HC) rectal foam Place 1 applicator rectally every morning.   10 g  0  . hydrocortisone-pramoxine (PROCTOFOAM-HC) rectal foam Place 1 applicator rectally daily at 6 (six) AM.  10 g  1  . mercaptopurine (PURINETHOL) 50 MG tablet Take 1 1/2 tablets by mouth once daily  135 tablet  3  . mesalamine (LIALDA) 1.2 G EC tablet TAKE 4 TABLETS BY MOUTH ONCE A DAY.  360 tablet  3  . mesalamine (ROWASA) 4 G enema Place 60 mLs (4 g total) rectally at bedtime.  14 Bottle  0  . Mesalamine-Cleanser (ROWASA) 4 G KIT Place 1 kit (4 g total) rectally at bedtime as needed.  1 kit  0   No Known Allergies  Family History  Problem Relation Age of Onset  . Breast cancer Mother   . Hyperlipidemia Mother   . Glaucoma Mother   . Macular degeneration Father   . Colon cancer Neg Hx        Objective:   Physical Exam BP 108/60  Pulse 64  Ht 5' 4"  (1.626 m)  Wt 151 lb (68.493 kg)  BMI 25.92 kg/m2 Constitutional: Well-developed and well-nourished. No distress. HEENT: Normocephalic and atraumatic. Oropharynx is clear and moist. No oropharyngeal exudate. Conjunctivae are normal. Pupils are equal round and reactive to light. No scleral icterus. Cardiovascular: Normal rate, regular rhythm and intact distal pulses. No M/R/G Pulmonary/chest: Effort normal and breath sounds normal. No wheezing, rales or rhonchi. Abdominal: Soft, nontender, nondistended. Bowel sounds active throughout. There are no masses palpable. No hepatosplenomegaly. Extremities: no clubbing, cyanosis, or edema Neurological: Alert and oriented to person place and time. Skin: Skin is warm and dry. No rashes noted. Psychiatric: Normal mood and affect. Behavior is normal.  CBC    Component Value Date/Time   WBC 11.7* 03/12/2011 1003  RBC 4.02 03/12/2011 1003   HGB 13.4 03/12/2011 1003   HCT 39.6 03/12/2011 1003   PLT 286.0 03/12/2011 1003   MCV 98.4 03/12/2011 1003   MCHC 33.8 03/12/2011 1003   RDW 13.9 03/12/2011 1003   LYMPHSABS 0.8 03/12/2011 1003   MONOABS 0.5 03/12/2011 1003   EOSABS 0.0  03/12/2011 1003   BASOSABS 0.0 03/12/2011 1003   CMP     Component Value Date/Time   NA 136 03/12/2011 1003   K 3.9 03/12/2011 1003   CL 101 03/12/2011 1003   CO2 29 03/12/2011 1003   GLUCOSE 119* 03/12/2011 1003   BUN 19 03/12/2011 1003   CREATININE 0.7 03/12/2011 1003   CALCIUM 8.9 03/12/2011 1003   PROT 6.9 08/14/2010 1518   ALBUMIN 3.9 08/14/2010 1518   AST 13 08/14/2010 1518   ALT 13 08/14/2010 1518   ALKPHOS 39 08/14/2010 1518   BILITOT 1.0 08/14/2010 1518   GFRNONAA 97.64 03/27/2009 1254       Assessment & Plan:   45 yo female who is a patient of Dr. Deatra Ina with a long-standing history of ulcerative colitis seen in followup after recent flare.  1. UC flare -- overall her symptoms are improving on 40 mg prednisone daily.  This is encouraging, and at this time I feel she can begin to taper this medication. I've advised she decrease to 30 mg daily now. She will continue 30 mg x7 days and then decrease by 5 mg every week thereafter until she has tapered off completely. She will continue 6-MP and Lialda at her current doses.  I will check a thiopurine metabolite panel today to see if her 6-MP dose could be raised slightly for better therapeutic effect.  She will followup with Dr. Deatra Ina in 4-6 weeks' time or sooner if needed.  Addendum:  Thiopurine metabolite panel reviewed: 6-TGN 470 6-MMPN 7384 Given these findings, her dose of 6-MP cannot be pushed further.  Her labs are reviewed and she has no evidence of leukopenia or hepatotoxicity on this dose. Will continue this dose for now and she will followup with Dr. Deatra Ina

## 2011-03-18 ENCOUNTER — Telehealth: Payer: Self-pay | Admitting: *Deleted

## 2011-03-18 NOTE — Telephone Encounter (Signed)
Patient given results as per Dr. Hilarie Fredrickson.

## 2011-03-18 NOTE — Telephone Encounter (Signed)
Message copied by Hulan Saas on Wed Mar 18, 2011  4:22 PM ------      Message from: Jerene Bears      Created: Thu Mar 12, 2011  6:10 PM       Labs okay, WBC and glucose slightly high likely as a consequence of steroid therapy      These would both be expected to normalize as the prednisone tapers down      We are awaiting the 6 MP metabolite panel

## 2011-03-23 ENCOUNTER — Telehealth: Payer: Self-pay

## 2011-03-23 NOTE — Telephone Encounter (Signed)
Pt aware of results per Dr. Hilarie Fredrickson, states she is feeling better. Pt scheduled for follow-up with Dr. Deatra Ina 04/03/11@8 :30am. Pt aware of appt date and time.

## 2011-03-23 NOTE — Telephone Encounter (Signed)
Message copied by Faythe Casa on Mon Mar 23, 2011  2:31 PM ------      Message from: Jerene Bears      Created: Mon Mar 23, 2011 12:24 PM      Regarding: lab results       Rollene Fare      Please let patient know her 6-MP level came back. Her level is okay, but her dose cannot be increased further or do to risk of leukopenia or hepatotoxicity.  Therefore I recommend she continue on this dose. Hopefully she is getting better. She should follow with Dr. Deatra Ina            Thanks

## 2011-04-03 ENCOUNTER — Ambulatory Visit: Payer: BC Managed Care – PPO | Admitting: Gastroenterology

## 2011-04-14 ENCOUNTER — Encounter: Payer: Self-pay | Admitting: Gastroenterology

## 2011-04-14 ENCOUNTER — Ambulatory Visit (INDEPENDENT_AMBULATORY_CARE_PROVIDER_SITE_OTHER): Payer: BC Managed Care – PPO | Admitting: Gastroenterology

## 2011-04-14 VITALS — BP 110/70 | HR 76 | Ht 64.0 in | Wt 154.4 lb

## 2011-04-14 DIAGNOSIS — K5150 Left sided colitis without complications: Secondary | ICD-10-CM

## 2011-04-14 MED ORDER — MESALAMINE 4 G RE ENEM: 4.0000 g | ENEMA | Freq: Every day | RECTAL | Status: DC

## 2011-04-14 MED ORDER — PREDNISONE 5 MG PO TABS: ORAL_TABLET | ORAL | Status: DC

## 2011-04-14 NOTE — Assessment & Plan Note (Signed)
She's had a recent flare that responded to prednisone but she has become symptomatic again after tapering.  Medications #1 continue lialda #2 resume prednisone 20 mg daily. She probably will require a slower taper #3 begin Rowasa enemas each bedtime

## 2011-04-14 NOTE — Progress Notes (Signed)
History of Present Illness:  Ms. Dunsmore had a recent flare of her left-sided colitis that responded well to prednisone. After dropping below 5 mg  she's had recurrence of symptoms including bleeding, excess gas and passage of mucus. She's having up to 4 bowel movements a day. She currently is on 6-MP and lialda only.    Review of Systems: Pertinent positive and negative review of systems were noted in the above HPI section. All other review of systems were otherwise negative.    Current Medications, Allergies, Past Medical History, Past Surgical History, Family History and Social History were reviewed in [facility] record  Vital signs were reviewed in today's medical record. Physical Exam: General: Well developed , well nourished, no acute distress

## 2011-04-20 ENCOUNTER — Telehealth: Payer: Self-pay | Admitting: Gastroenterology

## 2011-04-20 NOTE — Telephone Encounter (Signed)
Pt aware of the tapering instructions. States she has enough prednisone. Pt wants to know if she needs a follow-up OV. Please advise.

## 2011-04-20 NOTE — Telephone Encounter (Signed)
Pt saw Dr. Deatra Ina last week and was placed on Prednisone 93m daily. She states she is much better, only going to the bathroom once a day. The bleeding has stopped and the mucous and gas are better. Pt is calling asking about a slower taper for the prednisone. Dr. KDeatra Inaplease advise.

## 2011-04-20 NOTE — Telephone Encounter (Signed)
Decrease to pred 92m qd x 5 days, then 139mqd x 5, then  pred 1019mlt with 5 mg qd x 7, then 5mg59m x 7, then 5mg 70m x 7, then d/c

## 2011-04-21 NOTE — Telephone Encounter (Signed)
If feeling ok f/u 3 weeks

## 2011-04-21 NOTE — Telephone Encounter (Signed)
Pt aware and she states she will call back and schedule an appt.

## 2011-04-21 NOTE — Telephone Encounter (Signed)
Left message for pt to call back  °

## 2011-08-04 ENCOUNTER — Other Ambulatory Visit: Payer: Self-pay | Admitting: Obstetrics and Gynecology

## 2011-08-04 DIAGNOSIS — Z1231 Encounter for screening mammogram for malignant neoplasm of breast: Secondary | ICD-10-CM

## 2011-09-08 ENCOUNTER — Ambulatory Visit
Admission: RE | Admit: 2011-09-08 | Discharge: 2011-09-08 | Disposition: A | Discharge status: Home / Self Care | Payer: BC Managed Care – PPO | Source: Ambulatory Visit | Attending: Obstetrics and Gynecology | Admitting: Obstetrics and Gynecology

## 2011-09-08 DIAGNOSIS — Z1231 Encounter for screening mammogram for malignant neoplasm of breast: Secondary | ICD-10-CM

## 2011-09-10 ENCOUNTER — Other Ambulatory Visit: Payer: Self-pay | Admitting: Obstetrics and Gynecology

## 2011-09-14 ENCOUNTER — Other Ambulatory Visit: Payer: Self-pay | Admitting: Obstetrics and Gynecology

## 2011-09-14 DIAGNOSIS — R928 Other abnormal and inconclusive findings on diagnostic imaging of breast: Secondary | ICD-10-CM

## 2011-09-16 ENCOUNTER — Ambulatory Visit
Admission: RE | Admit: 2011-09-16 | Discharge: 2011-09-16 | Disposition: A | Discharge status: Home / Self Care | Payer: BC Managed Care – PPO | Source: Ambulatory Visit | Attending: Obstetrics and Gynecology | Admitting: Obstetrics and Gynecology

## 2011-09-16 DIAGNOSIS — R928 Other abnormal and inconclusive findings on diagnostic imaging of breast: Secondary | ICD-10-CM

## 2011-09-21 ENCOUNTER — Telehealth: Payer: Self-pay | Admitting: Gastroenterology

## 2011-09-21 NOTE — Telephone Encounter (Signed)
Pt started having a flare on Friday. Reports she has had 4 bloody bowel movements today. Offered pt an appt tomorrow but pt states she cannot come tomorrow because she has to pick up her child from camp. States usually she gets put on Prednisone and that helps. Dr. Deatra Ina please advise.

## 2011-09-22 MED ORDER — PREDNISONE 20 MG PO TABS: 20.0000 mg | ORAL_TABLET | Freq: Every day | ORAL | Status: DC

## 2011-09-22 NOTE — Telephone Encounter (Signed)
Pt aware and rx sent to the pharmacy.

## 2011-09-22 NOTE — Telephone Encounter (Signed)
Begin pred 55m qd.  C/b 3 days

## 2011-10-02 ENCOUNTER — Telehealth: Payer: Self-pay | Admitting: Gastroenterology

## 2011-10-02 NOTE — Telephone Encounter (Signed)
Patient calling to report she is now having normal bowel movements and the bleeding has stopped. She is on Prednisone 20 mg daily since 09/22/11. She would like to start tapering down. Please, advise

## 2011-10-05 NOTE — Telephone Encounter (Signed)
Left message for pt to call back  °

## 2011-10-05 NOTE — Telephone Encounter (Signed)
Spoke with pt and he is aware.

## 2011-10-05 NOTE — Telephone Encounter (Signed)
Begin 15 mg daily for 5 days then 10 mg daily for 5 days then 10 alternating with 5 mg for 5 days then 5 mg daily for 5 days then 5 mg every other day for 5 days then discontinue

## 2012-02-18 ENCOUNTER — Encounter: Payer: Self-pay | Admitting: Gastroenterology

## 2012-02-18 ENCOUNTER — Other Ambulatory Visit (INDEPENDENT_AMBULATORY_CARE_PROVIDER_SITE_OTHER): Payer: BC Managed Care – PPO

## 2012-02-18 ENCOUNTER — Ambulatory Visit (INDEPENDENT_AMBULATORY_CARE_PROVIDER_SITE_OTHER): Payer: BC Managed Care – PPO | Admitting: Gastroenterology

## 2012-02-18 VITALS — BP 120/76 | HR 67 | Ht 64.5 in | Wt 159.6 lb

## 2012-02-18 DIAGNOSIS — K5150 Left sided colitis without complications: Secondary | ICD-10-CM

## 2012-02-18 LAB — CBC WITH DIFFERENTIAL/PLATELET
Basophils Relative: 0.4 % (ref 0.0–3.0)
Eosinophils Relative: 1.3 % (ref 0.0–5.0)
HCT: 40.9 % (ref 36.0–46.0)
Hemoglobin: 13.6 g/dL (ref 12.0–15.0)
Lymphs Abs: 1 10*3/uL (ref 0.7–4.0)
MCV: 96.1 fl (ref 78.0–100.0)
Monocytes Absolute: 0.5 10*3/uL (ref 0.1–1.0)
Monocytes Relative: 8.2 % (ref 3.0–12.0)
Neutro Abs: 4.7 10*3/uL (ref 1.4–7.7)
Platelets: 316 10*3/uL (ref 150.0–400.0)
WBC: 6.2 10*3/uL (ref 4.5–10.5)

## 2012-02-18 LAB — COMPREHENSIVE METABOLIC PANEL
Alkaline Phosphatase: 48 U/L (ref 39–117)
BUN: 12 mg/dL (ref 6–23)
CO2: 27 mEq/L (ref 19–32)
Creatinine, Ser: 0.7 mg/dL (ref 0.4–1.2)
GFR: 97.93 mL/min (ref >60.00)
Glucose, Bld: 92 mg/dL (ref 70–99)
Total Bilirubin: 0.5 mg/dL (ref 0.3–1.2)
Total Protein: 7.7 g/dL (ref 6.0–8.3)

## 2012-02-18 MED ORDER — MESALAMINE 4 G RE ENEM: 4.0000 g | ENEMA | RECTAL | Status: DC

## 2012-02-18 MED ORDER — HYDROCORTISONE 100 MG/60ML RE ENEM: 100.0000 mg | ENEMA | Freq: Every day | RECTAL | Status: DC

## 2012-02-18 MED ORDER — MERCAPTOPURINE 50 MG PO TABS: ORAL_TABLET | ORAL | Status: DC

## 2012-02-18 MED ORDER — MESALAMINE 1.2 G PO TBEC: DELAYED_RELEASE_TABLET | ORAL | Status: DC

## 2012-02-18 MED ORDER — MESALAMINE 4 G RE ENEM: 4.0000 g | ENEMA | Freq: Every day | RECTAL | Status: DC

## 2012-02-18 NOTE — Progress Notes (Signed)
History of Present Illness:  46 year old white female with left-sided colitis here for a flare up of symptoms. For the past 2 weeks if she's had increasing bleeding with bowel movements consisted of bright red blood mixed with her stools. She's passing mucousy stools and having increased frequency of stools. Stools are solid. She has occasional sharp fleeting right upper quadrant pain. She remains on 6-MP and lialda. One year ago 6-TG and MMP levels were maximal.  She denies antibiotic use    Review of Systems: Pertinent positive and negative review of systems were noted in the above HPI section. All other review of systems were otherwise negative.    Current Medications, Allergies, Past Medical History, Past Surgical History, Family History and Social History were reviewed in [facility] record  Vital signs were reviewed in today's medical record. Physical Exam: General: Well developed , well nourished, no acute distress Head: Normocephalic and atraumatic Eyes:  sclerae anicteric, EOMI Ears: Normal auditory acuity Mouth: No deformity or lesions Lungs: Clear throughout to auscultation Heart: Regular rate and rhythm; no murmurs, rubs or bruits Abdomen: Soft, non tender and non distended. No masses, hepatosplenomegaly or hernias noted. Normal Bowel sounds Rectal:deferred Musculoskeletal: Symmetrical with no gross deformities  Pulses:  Normal pulses noted Extremities: No clubbing, cyanosis, edema or deformities noted Neurological: Alert oriented x 4, grossly nonfocal Psychological:  Alert and cooperative. Normal mood and affect

## 2012-02-18 NOTE — Assessment & Plan Note (Addendum)
Flareup of ulcerative colitis  Recommendations #1 begin cort enemas and mesalamine enemas while continuing her other medications.

## 2012-02-18 NOTE — Patient Instructions (Addendum)
Research will be speaking with you today We have given you printed prescriptions today

## 2012-02-23 ENCOUNTER — Telehealth: Payer: Self-pay | Admitting: Gastroenterology

## 2012-02-23 NOTE — Telephone Encounter (Signed)
Pt called for lab results. Discussed results with pt.

## 2012-03-03 ENCOUNTER — Telehealth: Payer: Self-pay | Admitting: Gastroenterology

## 2012-03-03 DIAGNOSIS — K5150 Left sided colitis without complications: Secondary | ICD-10-CM

## 2012-03-03 MED ORDER — MESALAMINE 4 G RE ENEM: 4.0000 g | ENEMA | RECTAL | Status: DC

## 2012-03-03 NOTE — Telephone Encounter (Signed)
Left message for pt to call back.  Pt states she is doing much better but the script for Rowasa was sent to [facility] outpt pharmacy and she needs a written script to send to her pharmacy. Pt will come and pick the script up.

## 2012-03-14 ENCOUNTER — Encounter: Payer: Self-pay | Admitting: Physician Assistant

## 2012-03-14 ENCOUNTER — Ambulatory Visit (INDEPENDENT_AMBULATORY_CARE_PROVIDER_SITE_OTHER): Payer: BC Managed Care – PPO | Admitting: Physician Assistant

## 2012-03-14 ENCOUNTER — Telehealth: Payer: Self-pay | Admitting: Gastroenterology

## 2012-03-14 VITALS — BP 110/70 | HR 68 | Ht 64.5 in | Wt 157.6 lb

## 2012-03-14 DIAGNOSIS — R197 Diarrhea, unspecified: Secondary | ICD-10-CM

## 2012-03-14 DIAGNOSIS — K5190 Ulcerative colitis, unspecified, without complications: Secondary | ICD-10-CM

## 2012-03-14 MED ORDER — DICYCLOMINE HCL 10 MG PO CAPS: ORAL_CAPSULE | ORAL | Status: DC

## 2012-03-14 MED ORDER — BUDESONIDE 9 MG PO TB24: 1.0000 | ORAL_TABLET | Freq: Every day | ORAL | Status: DC

## 2012-03-14 NOTE — Progress Notes (Signed)
Subjective:    Patient ID: [name], female    DOB: 1965/10/04, 46 y.o.   MRN: [id number]  HPI [name] is a very nice 46 year old white female known to Dr. Deatra Ina with history of left-sided ulcerative colitis. She was last seen about 3 weeks ago and having a flare of symptoms with increased diarrhea cramping and bleeding. She's maintained chronically on 6-MP at 75 mg daily and lialda 4.8 g daily. At that time she was started on Rowasa enemas twice daily. She says she thought she was doing a bit better but now over the past 3-4 days has had a marked increase in diarrhea and abdominal cramping as well as bleeding . She says yesterday she had at least 8-10 bowel movements and has been eating much less because of urgency postprandially . She's not had any fever or chills, denies nausea or vomiting. She's not been on any antibiotics recently but does work as a Teacher, adult education . She says she's had a difficult year has been on steroids 3 times over the past year. She asks about Humira.    Review of Systems  Constitutional: Positive for appetite change and fatigue.  HENT: Negative.   Eyes: Negative.   Respiratory: Negative.   Cardiovascular: Negative.   Gastrointestinal: Positive for abdominal pain, diarrhea and blood in stool.  Genitourinary: Negative.   Musculoskeletal: Negative.   Hematological: Negative.   Psychiatric/Behavioral: Negative.    Outpatient Prescriptions Prior to Visit  Medication Sig Dispense Refill  . mercaptopurine (PURINETHOL) 50 MG tablet Take 1 1/2 tablets by mouth once daily  135 tablet  11  . mesalamine (LIALDA) 1.2 G EC tablet TAKE 4 TABLETS BY MOUTH ONCE A DAY.  360 tablet  11  . mesalamine (ROWASA) 4 G enema Place 60 mLs (4 g total) rectally every morning.  25 Bottle  2  . norgestrel-ethinyl estradiol (LO/OVRAL,CRYSELLE) 0.3-30 MG-MCG tablet Take 1 tablet by mouth daily.      . [DISCONTINUED] hydrocortisone (CORTENEMA) 100 MG/60ML enema Place 1 enema (100 mg  total) rectally at bedtime.  25 mL  2  . [DISCONTINUED] mesalamine (ROWASA) 4 G enema Place 60 mLs (4 g total) rectally at bedtime.  60 mL  2  . [DISCONTINUED] predniSONE (DELTASONE) 20 MG tablet Take 1 tablet (20 mg total) by mouth daily.  30 tablet  0  . [DISCONTINUED] predniSONE (DELTASONE) 5 MG tablet Use as directed  30 tablet  2   Last reviewed on 03/14/2012  4:51 PM by Alfredia Ferguson, PA  No Known Allergies Patient Active Problem List  Diagnosis  . LEFT SIDED ULCERATIVE COLITIS   History  Substance Use Topics  . Smoking status: Never Smoker   . Smokeless tobacco: Never Used  . Alcohol Use: No       Objective:   Physical Exam well-developed white female in no acute distress pleasant blood pressure 110/70 pulse 68 height 5 foot 4 weight 157. HEENT nontraumatic normocephalic EOMI PERRLA sclera anicteric, Neck;Supple no JVD, Cardiovascular; regular rate and rhythm with S1-S2 no murmur or gallop, Pulmonary; clear bilaterally, Abdomen; soft, she is tender in the left lower quadrant and suprapubic area there is no guarding or rebound no palpable mass or hepatosplenomegaly sounds are active, Rectal; exam not done, Extremities ;no clubbing, cyanosis or edema skin warm dry, Psych; mood and affect normal and appropriate.        Assessment & Plan:  #85 46 year old female with left-sided ulcerative colitis with exacerbation now refractory to 6-MP,  lialda  and Rowasa enemas. Will rule out superimposed C. Difficile She is hesitant to resume steroids as she is gaining a lot of weight.  Plan; start trial of Uceris 9 milligrams by mouth daily. Will plan a 6-8 week course. She is advised that if she does not see any improvement in her symptoms over the next 7-10 days that we may need to proceed with prednisone. She will continue Lialda 4.8 g daily Continue 6-MP 75 mg by mouth daily Continue Rowasa enemas twice daily Check stool for C. difficile by PCR Discussion initiated regarding biologic  therapy and she was given information to read about Humira-she will need hepatitis serologies and TB testing next office visit

## 2012-03-14 NOTE — Patient Instructions (Addendum)
Please go to the basement level to the lab for the stool study. We have given a brochure on Humira. Continue the current dose of Lialda. Continue the Rowasa Enemas. Continue the 6MP.    We have given you printed prescriptions for Bentyl and Uceris. We have also given you samples of Uceris to try before you pick up the prescription.

## 2012-03-14 NOTE — Telephone Encounter (Signed)
Patient reports that she had an increase of diarrhea, rectal bleeding, and gas over the weekend.  She was seen by Dr. Deatra Ina on 02/18/12 and he added back Rowasa enemas with no improvement.  She will come in and see Nicoletta Ba PA today at 2:30

## 2012-03-15 ENCOUNTER — Other Ambulatory Visit: Payer: BC Managed Care – PPO

## 2012-03-15 DIAGNOSIS — K5190 Ulcerative colitis, unspecified, without complications: Secondary | ICD-10-CM

## 2012-03-15 DIAGNOSIS — R197 Diarrhea, unspecified: Secondary | ICD-10-CM

## 2012-03-17 LAB — CLOSTRIDIUM DIFFICILE BY PCR: Toxigenic C. Difficile by PCR: NOT DETECTED

## 2012-03-21 NOTE — Progress Notes (Signed)
Reviewed and agree with management. Danetra Glock D. Cahlil Sattar, M.D., FACG  

## 2012-05-03 ENCOUNTER — Telehealth: Payer: Self-pay | Admitting: Gastroenterology

## 2012-05-03 NOTE — Telephone Encounter (Signed)
Pt states that the Uceris that she was given had been working for 2-3 weeks, now she reports it is not helping. Pt states there is a lot of blood in her stool and she is having multiple stools. Offered pt an appt for today but she requested an OV for tomorrow. Pt scheduled to see Nicoletta Ba PA tomorrow at 9am. Pt aware of appt date and time.

## 2012-05-04 ENCOUNTER — Ambulatory Visit (INDEPENDENT_AMBULATORY_CARE_PROVIDER_SITE_OTHER): Payer: BC Managed Care – PPO | Admitting: Physician Assistant

## 2012-05-04 ENCOUNTER — Encounter: Payer: Self-pay | Admitting: Gastroenterology

## 2012-05-04 ENCOUNTER — Other Ambulatory Visit (INDEPENDENT_AMBULATORY_CARE_PROVIDER_SITE_OTHER): Payer: BC Managed Care – PPO

## 2012-05-04 ENCOUNTER — Encounter: Payer: Self-pay | Admitting: Physician Assistant

## 2012-05-04 VITALS — BP 100/60 | HR 70 | Ht 64.0 in | Wt 156.0 lb

## 2012-05-04 DIAGNOSIS — K5190 Ulcerative colitis, unspecified, without complications: Secondary | ICD-10-CM

## 2012-05-04 LAB — CBC WITH DIFFERENTIAL/PLATELET
Basophils Absolute: 0 10*3/uL (ref 0.0–0.1)
Basophils Relative: 0.5 % (ref 0.0–3.0)
Eosinophils Absolute: 0.1 10*3/uL (ref 0.0–0.7)
MCHC: 34.2 g/dL (ref 30.0–36.0)
MCV: 94.1 fl (ref 78.0–100.0)
Monocytes Absolute: 0.6 10*3/uL (ref 0.1–1.0)
Neutro Abs: 4.7 10*3/uL (ref 1.4–7.7)
Neutrophils Relative %: 74.4 % (ref 43.0–77.0)
RBC: 4.21 Mil/uL (ref 3.87–5.11)
RDW: 13.7 % (ref 11.5–14.6)

## 2012-05-04 LAB — COMPREHENSIVE METABOLIC PANEL
ALT: 14 U/L (ref 0–35)
AST: 16 U/L (ref 0–37)
Alkaline Phosphatase: 51 U/L (ref 39–117)
BUN: 11 mg/dL (ref 6–23)
Creatinine, Ser: 0.7 mg/dL (ref 0.4–1.2)
Potassium: 3.8 mEq/L (ref 3.5–5.1)

## 2012-05-04 MED ORDER — PREDNISONE 20 MG PO TABS: ORAL_TABLET | ORAL | Status: DC

## 2012-05-04 MED ORDER — NA SULFATE-K SULFATE-MG SULF 17.5-3.13-1.6 GM/177ML PO SOLN: 1.0000 | Freq: Once | ORAL | Status: DC

## 2012-05-04 NOTE — Patient Instructions (Addendum)
Please go to the basement level to have your labs drawn.  We sent a prescription for Prednisone 20 mg tablets to [facility]. Take 1 1/2 tab  (53m) every morning.   Come back Friday 2-21 any time to have the TB read.  You can ask for Zoltan Genest.   See your Primary care about getting a Pneumonia Vaccine . Also a Tetanus booster if you havn't hd one in 10 years.   You have been scheduled for a colonoscopy with propofol. Please follow written instructions given to you at your visit today.  We have given you the Suprep for the colonoscopy prep you will be drinking prior to the procedure. If you use inhalers (even only as needed) or a CPAP machine, please bring them with you on the day of your procedure.

## 2012-05-04 NOTE — Progress Notes (Signed)
Reviewed and agree with management. Liyah Higham D. Ciella Obi, M.D., FACG  

## 2012-05-04 NOTE — Progress Notes (Signed)
Subjective:    Patient ID: [name], female    DOB: [birth year], 47 y.o.   MRN: [id number]  HPI [name] is very nice 47 year old white female known to Dr. Deatra Ina with history of left-sided ulcerative colitis. She was seen last about 6 weeks ago and at that time was having an exacerbation of her colitis with increased diarrhea abdominal cramping and rectal bleeding. She's been maintained chronically on 6-MP at 75 mg per day Truman Hayward all the 4.8 g daily. At the start of her flare she was placed on Rowasa enemas twice daily which did not seem to have much effect. We then initiated Uceris  9 milligrams by mouth daily and stool was checked for C. difficile PCR and this was negative.  She states that the Uceris  made her feel much better for the first couple of weeks but then after  she was on it for about 3 weeks it seemed to be less effective and now over the past month despite continuing on the Uceris her symptoms have worsened. She completed a 7 week course of the Uceris  And is  now  having 10-12 small-volume bloody bowel movements per day with lots of tenesmus and abdominal cramping. She says that she has a lot of or urgency and has had a few accidents as well. She has required steroids about 3 times in 2013 . Initiated discussion regarding biologics  at the time of her last office visit. She  is frustrated at this point and does not feel well. Last colonoscopy was done in August of 2008 and at that time she had active colitis moderately severe from the transverse colon to the rectum.    Review of Systems  Constitutional: Positive for fatigue.  HENT: Negative.   Respiratory: Negative.   Cardiovascular: Negative.   Gastrointestinal: Positive for abdominal pain, diarrhea and blood in stool.  Endocrine: Negative.   Genitourinary: Negative.   Musculoskeletal: Negative.   Allergic/Immunologic: Negative.   Neurological: Negative.   Hematological: Negative.   Psychiatric/Behavioral: Negative.     Outpatient Prescriptions Prior to Visit  Medication Sig Dispense Refill  . Budesonide (UCERIS) 9 MG TB24 Take 1 tablet by mouth daily.  30 tablet  1  . dicyclomine (BENTYL) 10 MG capsule Take 1 tab 3 times daily for cramping and spasms.  50 capsule  0  . mercaptopurine (PURINETHOL) 50 MG tablet Take 1 1/2 tablets by mouth once daily  135 tablet  11  . mesalamine (LIALDA) 1.2 G EC tablet TAKE 4 TABLETS BY MOUTH ONCE A DAY.  360 tablet  11  . mesalamine (ROWASA) 4 G enema Place 60 mLs (4 g total) rectally every morning.  25 Bottle  2  . norgestrel-ethinyl estradiol (LO/OVRAL,CRYSELLE) 0.3-30 MG-MCG tablet Take 1 tablet by mouth daily.       No facility-administered medications prior to visit.   No Known Allergies Patient Active Problem List  Diagnosis  . LEFT SIDED ULCERATIVE COLITIS   History  Substance Use Topics  . Smoking status: Never Smoker   . Smokeless tobacco: Never Used  . Alcohol Use: No       Objective:   Physical Exam well-developed white female in no acute distress, quite pleasant blood pressure 100/60 pulse 70 height 5 foot 4 weight 156. HEENT; nontraumatic normocephalic EOMI PERRLA sclera anicteric,Neck; Supple no JVD, Cardiovascular; regular rate and rhythm with S1-S2 no murmur or gallop, Pulmonary; clear, Abdomen; soft , mild generalized tenderness, no guarding or rebound, no palpable mass or  hepatosplenomegaly bowel sounds are active, Recta;l exam not done, Extremities; no clubbing cyanosis or edema skin warm and dry, Psych; mood and affect normal and appropriate.        Assessment & Plan:  #50 47 year old female with long history of ulcerative colitis/left sided with exacerbation now refractory to Uceris in a patient already maintained on mercaptopurine. I think she will need to initiate biologic therapy. Prior to starting this will need repeat colonoscopy. Will also start prednisone in the interim.  Plan; check CBC and see met today, also hepatitis  serologies Place TB skin test, which will be red in 48 hours Patient is already on an immunomodulator so vaccinations  will be limited to inactive vaccines. Have asked her to see her primary care physician  To  get a pneumococcal vaccine, and a tetanus posterior if she has not had one in the past 10 years. She has been vaccinated for hepatitis B as she works as a Marine scientist. Continue 6-MP 75 mg by mouth daily Continue Lialda  4.8 g daily Start prednisone 30 mg by mouth daily, and we'll continue this until her colonoscopy then decide if dose reduction is appropriate.

## 2012-05-05 LAB — HEPATITIS B SURFACE ANTIBODY,QUALITATIVE: Hep B S Ab: REACTIVE — AB

## 2012-05-05 LAB — HEPATITIS B SURFACE ANTIGEN: Hepatitis B Surface Ag: NEGATIVE

## 2012-05-06 ENCOUNTER — Telehealth: Payer: Self-pay | Admitting: *Deleted

## 2012-05-06 LAB — TB SKIN TEST: TB Skin Test: NEGATIVE

## 2012-05-06 NOTE — Telephone Encounter (Signed)
Results negative.

## 2012-05-12 ENCOUNTER — Ambulatory Visit (AMBULATORY_SURGERY_CENTER): Payer: BC Managed Care – PPO | Admitting: Gastroenterology

## 2012-05-12 ENCOUNTER — Encounter: Payer: Self-pay | Admitting: Gastroenterology

## 2012-05-12 VITALS — BP 126/77 | HR 59 | Temp 99.4°F | Resp 16 | Ht 64.0 in | Wt 156.0 lb

## 2012-05-12 DIAGNOSIS — K5289 Other specified noninfective gastroenteritis and colitis: Secondary | ICD-10-CM

## 2012-05-12 DIAGNOSIS — K5100 Ulcerative (chronic) pancolitis without complications: Secondary | ICD-10-CM

## 2012-05-12 HISTORY — PX: COLONOSCOPY: SHX174

## 2012-05-12 MED ORDER — SODIUM CHLORIDE 0.9 % IV SOLN: 500.0000 mL | INTRAVENOUS | Status: DC

## 2012-05-12 NOTE — Op Note (Signed)
[facility]  [address]. [place], [address]   COLONOSCOPY PROCEDURE REPORT  PATIENT: Kim, Rubio  MR#: [id number] BIRTHDATE: 11/10/1965 , 46  yrs. old GENDER: Female ENDOSCOPIST: Inda Castle, MD REFERRED BY: PROCEDURE DATE:  05/12/2012 PROCEDURE:   Colonoscopy with biopsy ASA CLASS:   Class II INDICATIONS:follow up for previously diagnosed left sided ulcerative colitis. MEDICATIONS: MAC sedation, administered by CRNA and propofol (Diprivan) 375m IV  DESCRIPTION OF PROCEDURE:   After the risks benefits and alternatives of the procedure were thoroughly explained, informed consent was obtained.  A digital rectal exam revealed no abnormalities of the rectum.   The LB [id number]-[id number] endoscope was introduced through the anus and advanced to the cecum, which was identified by both the appendix and ileocecal valve. No adverse events experienced.   The quality of the prep was Suprep good  The instrument was then slowly withdrawn as the colon was fully examined.      COLON FINDINGS: In the left colon there was diffuse mucosal edema there is erythema and friability.  Changes began in the rectum and extended to the proximal sigmoid or distal descending colon. Inflammatory changes are progressive in the more distal colon.  The colon proximal to the mid descending colon appear normal.  The colon was irrigated with methylene blue impregnated normal saline.  Areas of increased uptake were selectively biopsied to rule out dysplasia.  These areas were confined to the left colon only.  Retroflexed views revealed no abnormalities. The time to cecum=6 minutes 02 seconds.  Withdrawal time=13 minutes 03 seconds. The scope was withdrawn and the procedure completed. COMPLICATIONS: There were no complications.  ENDOSCOPIC IMPRESSION: moderately active left-sided colitis  RECOmmendations 1.  await biopsy results 2.  increase prednisone to 40 mg daily 3.   continue Rowasa enemas, 6-MP and Lialda 4.  office visit 2 weeks  eSigned:  RInda Castle MD 05/12/2012 3:48 PM   cc:   PATIENT NAME:  Kim, SandeenMR#: [id number]

## 2012-05-12 NOTE — Patient Instructions (Signed)
INCREASE YOUR PREDNISONE TO 40 MG. DAILY. CONTINUE ROWASA ENEMAS,6-MP AND LIALDA. OFFICE VISIT IN 2 WEEKS.   YOU HAD AN ENDOSCOPIC PROCEDURE TODAY AT [facility]: Refer to the procedure report that was given to you for any specific questions about what was found during the examination.  If the procedure report does not answer your questions, please call your gastroenterologist to clarify.  If you requested that your care partner not be given the details of your procedure findings, then the procedure report has been included in a sealed envelope for you to review at your convenience later.  YOU SHOULD EXPECT: Some feelings of bloating in the abdomen. Passage of more gas than usual.  Walking can help get rid of the air that was put into your GI tract during the procedure and reduce the bloating. If you had a lower endoscopy (such as a colonoscopy or flexible sigmoidoscopy) you may notice spotting of blood in your stool or on the toilet paper. If you underwent a bowel prep for your procedure, then you may not have a normal bowel movement for a few days.  DIET: Your first meal following the procedure should be a light meal and then it is ok to progress to your normal diet.  A half-sandwich or bowl of soup is an example of a good first meal.  Heavy or fried foods are harder to digest and may make you feel nauseous or bloated.  Likewise meals heavy in dairy and vegetables can cause extra gas to form and this can also increase the bloating.  Drink plenty of fluids but you should avoid alcoholic beverages for 24 hours.  ACTIVITY: Your care partner should take you home directly after the procedure.  You should plan to take it easy, moving slowly for the rest of the day.  You can resume normal activity the day after the procedure however you should NOT DRIVE or use heavy machinery for 24 hours (because of the sedation medicines used during the test).    SYMPTOMS TO REPORT IMMEDIATELY: A  gastroenterologist can be reached at any hour.  During normal business hours, 8:30 AM to 5:00 PM Monday through Friday, call [phone].  After hours and on weekends, please call the GI answering service at [phone] who will take a message and have the physician on call contact you.   Following lower endoscopy (colonoscopy or flexible sigmoidoscopy):  Excessive amounts of blood in the stool  Significant tenderness or worsening of abdominal pains  Swelling of the abdomen that is new, acute  Fever of 100F or higher  FOLLOW UP: If any biopsies were taken you will be contacted by phone or by letter within the next 1-3 weeks.  Call your gastroenterologist if you have not heard about the biopsies in 3 weeks.  Our staff will call the home number listed on your records the next business day following your procedure to check on you and address any questions or concerns that you may have at that time regarding the information given to you following your procedure. This is a courtesy call and so if there is no answer at the home number and we have not heard from you through the emergency physician on call, we will assume that you have returned to your regular daily activities without incident.  SIGNATURES/CONFIDENTIALITY: You and/or your care partner have signed paperwork which will be entered into your electronic medical record.  These signatures attest to the fact that that the information above  on your After Visit Summary has been reviewed and is understood.  Full responsibility of the confidentiality of this discharge information lies with you and/or your care-partner.

## 2012-05-12 NOTE — Progress Notes (Signed)
Methylene Blue for tissue staining performed during procedure. Pt. Tolerated without difficulty.

## 2012-05-12 NOTE — Progress Notes (Signed)
Patient did not experience any of the following events: a burn prior to discharge; a fall within the facility; wrong site/side/patient/procedure/implant event; or a hospital transfer or hospital admission upon discharge from the facility. (G8907) Patient did not have preoperative order for IV antibiotic SSI prophylaxis. (G8918)  

## 2012-05-12 NOTE — Progress Notes (Signed)
A/ox3 pleased with MAC report to [employer]

## 2012-05-12 NOTE — Progress Notes (Signed)
Called to room to assist during endoscopic procedure.  Patient ID and intended procedure confirmed with present staff. Received instructions for my participation in the procedure from the performing physician.  

## 2012-05-13 ENCOUNTER — Telehealth: Payer: Self-pay | Admitting: *Deleted

## 2012-05-13 NOTE — Telephone Encounter (Signed)
  Follow up Call-  Call back number 05/12/2012  Post procedure Call Back phone  # [phone]  Permission to leave phone message Yes     Patient questions:  Message left to call if necessary.

## 2012-05-18 ENCOUNTER — Encounter: Payer: Self-pay | Admitting: Gastroenterology

## 2012-05-26 ENCOUNTER — Telehealth: Payer: Self-pay | Admitting: Gastroenterology

## 2012-05-26 NOTE — Telephone Encounter (Signed)
Pt states that she was put on Prednisone 55m daily when she had her colonoscopy. Pt was to call [place] when she was feeling better to see what to do with her prednisone. Pt reports that the bleeding has stopped. States she had to take an Imodium 4 days ago due to multiple BM's but this has subsided now. Pt wants to know what to do regarding her prednisone. Please advise.

## 2012-05-30 ENCOUNTER — Ambulatory Visit: Payer: BC Managed Care – PPO | Admitting: Gastroenterology

## 2012-05-30 NOTE — Telephone Encounter (Signed)
Spoke with pt and she is aware. Pt already has OV scheduled.

## 2012-05-30 NOTE — Telephone Encounter (Signed)
Begin pred 37m qd x 5 days, then pred 245mqd x 5 days then 1544md x 5 days then 48m91m x 5 days Needs OV

## 2012-06-10 ENCOUNTER — Encounter: Payer: Self-pay | Admitting: Gastroenterology

## 2012-06-10 ENCOUNTER — Ambulatory Visit (INDEPENDENT_AMBULATORY_CARE_PROVIDER_SITE_OTHER): Payer: BC Managed Care – PPO | Admitting: Gastroenterology

## 2012-06-10 VITALS — BP 110/64 | HR 64 | Ht 64.0 in | Wt 157.0 lb

## 2012-06-10 DIAGNOSIS — K5150 Left sided colitis without complications: Secondary | ICD-10-CM

## 2012-06-10 DIAGNOSIS — K5190 Ulcerative colitis, unspecified, without complications: Secondary | ICD-10-CM

## 2012-06-10 MED ORDER — ADALIMUMAB 40 MG/0.8ML ~~LOC~~ KIT: PACK | SUBCUTANEOUS | Status: DC

## 2012-06-10 NOTE — Progress Notes (Signed)
History of Present Illness:  [name] has returned for followup of left-sided colitis. Colonoscopy demonstrated a moderate colitis in the left colon. She has tapered prednisone from 30-20 mg and continue 6-MP and lialda. Despite these medications she continues to have moderately severe urgency with some bleeding and loose stools. She had only transient improvement with Uceris.   she's had several flareups over the past 15 months.     Review of Systems: Pertinent positive and negative review of systems were noted in the above HPI section. All other review of systems were otherwise negative.    Current Medications, Allergies, Past Medical History, Past Surgical History, Family History and Social History were reviewed in [facility] record  Vital signs were reviewed in today's medical record. Physical Exam: General: Well developed , well nourished, no acute stress, round face

## 2012-06-10 NOTE — Patient Instructions (Addendum)
We are sending you to Radiology for a Bone Density Scan in the BAsement  We will start you on Humeria You will be contacted by Vaughan Basta when to come in for your [facility] teaching

## 2012-06-10 NOTE — Assessment & Plan Note (Addendum)
I had a in depth discussion of therapeutic options including biologic therapy, immunotherapy and surgery. She is had multiple flares over the past 15 months and currently is symptomatic despite a moderate dose of prednisone. Patient is enthusiastic about trying biologic therapy with Humira. Complications including opportunistic infection, higher rate of malignancies including lymphoma were discussed.  Recommendations #1 begin Humira 160 mg week one, 80 mg week 2 then 40 mg subcutaneous every 2 weeks beginning week 4 #2 continue other medications. May have to increase prednisone to 40 mg daily if diarrhea should worsen #3 bone density scan

## 2012-06-14 ENCOUNTER — Ambulatory Visit (INDEPENDENT_AMBULATORY_CARE_PROVIDER_SITE_OTHER)
Admission: RE | Admit: 2012-06-14 | Discharge: 2012-06-14 | Disposition: A | Discharge status: Home / Self Care | Payer: BC Managed Care – PPO | Source: Ambulatory Visit | Attending: Gastroenterology | Admitting: Gastroenterology

## 2012-06-14 DIAGNOSIS — K5190 Ulcerative colitis, unspecified, without complications: Secondary | ICD-10-CM

## 2012-07-11 ENCOUNTER — Telehealth: Payer: Self-pay

## 2012-07-11 NOTE — Telephone Encounter (Signed)
Pt called to give [place] an update. States she took her 1st Humira dose on Wed (144m) and she is feeling some better. States she is still taking prednisone 367mdaily. Reports that she is feeling some better, she is still having a little bleeding. Pt is still having 1-2 BM's per day and they are still a little loose. Pt wants to know if she can decrease her prednisone any now. Please advise.

## 2012-07-11 NOTE — Telephone Encounter (Signed)
She can lower prednisone to 20 mg.  She can lower to 15 mg then 10 mg then 10 mg alternating with 5 mg, every 7 days if she continues to improve.

## 2012-07-12 NOTE — Telephone Encounter (Signed)
Spoke with pt and she is aware.

## 2012-07-14 ENCOUNTER — Telehealth: Payer: Self-pay | Admitting: Gastroenterology

## 2012-07-14 NOTE — Telephone Encounter (Signed)
Pt aware.

## 2012-07-14 NOTE — Telephone Encounter (Signed)
Normal exam

## 2012-07-14 NOTE — Telephone Encounter (Signed)
Spoke with pt and she would like to know the results of her Bone Density scan. Please advise.

## 2012-08-01 ENCOUNTER — Other Ambulatory Visit: Payer: Self-pay | Admitting: Physician Assistant

## 2012-08-01 ENCOUNTER — Encounter: Payer: Self-pay | Admitting: Physician Assistant

## 2012-08-01 ENCOUNTER — Ambulatory Visit (INDEPENDENT_AMBULATORY_CARE_PROVIDER_SITE_OTHER): Payer: BC Managed Care – PPO | Admitting: Physician Assistant

## 2012-08-01 VITALS — BP 104/70 | HR 68 | Temp 97.3°F | Resp 16 | Ht 63.5 in | Wt 162.0 lb

## 2012-08-01 DIAGNOSIS — R1011 Right upper quadrant pain: Secondary | ICD-10-CM

## 2012-08-01 DIAGNOSIS — G8929 Other chronic pain: Secondary | ICD-10-CM

## 2012-08-01 DIAGNOSIS — R110 Nausea: Secondary | ICD-10-CM

## 2012-08-01 NOTE — Telephone Encounter (Signed)
Medication refilled per protocol. 

## 2012-08-01 NOTE — Progress Notes (Signed)
Patient ID: [name] MRN: [id number], DOB: [birth year], 47 y.o. Date of Encounter: @DATE @  Chief Complaint:  Chief Complaint  Patient presents with  . c/o RUQ pain  x 1 month    HPI: 47 y.o. year old female  presents with c/o pain in right upper quadrant of abdomen. Felt it for a while about 3 months ago but then it resolve for a while. For past one month has been feeling constant ache in RUQ. It does not wake hre from sleep but as soon as she wakes up she feels it. It persists all day. After she eats, the pain gets worse. Also feels nauseas after she eats. No vomiting. No fever/chills. In general, with UC, she has some diarrhea but has had no change in bowels associated with this RUQ pain.  Has ulceratice colitis so it is hard for her to tell what symtoms are coming from what sometimes. UC had been flaring a lot-had been on prednisone for a long time. Gained weight, etc. Finally got off prednisone. Started Humira. Had recent colonoscpoy -showed mod-severe colitis-only in one area of colon. However, was not having this RUQ pain at time of her LOV with GI. She called GI office, discussed with nurse there, and they rec she begin eval here.    Past Medical History  Diagnosis Date  . Ulcerative colitis      Home Meds: See attached medication section for current medication list. Any medications entered into computer today will not appear on this note's list. The medications listed below were entered prior to today. Current Outpatient Prescriptions on File Prior to Visit  Medication Sig Dispense Refill  . adalimumab (HUMIRA) 40 MG/0.8ML injection Inject 160 mg subcutaneously on day one, 80 mg subcutaneously day 7, then 40 mg subcutaneously day 28 and every 14 days thereafter  2 each  15  . citalopram (CELEXA) 20 MG tablet TAKE 1 TABLET DAILY  90 tablet  0  . mercaptopurine (PURINETHOL) 50 MG tablet Take 1 1/2 tablets by mouth once daily  135 tablet  11  . mesalamine (LIALDA) 1.2 G EC tablet  TAKE 4 TABLETS BY MOUTH ONCE A DAY.  360 tablet  11  . norgestrel-ethinyl estradiol (LO/OVRAL,CRYSELLE) 0.3-30 MG-MCG tablet Take 1 tablet by mouth daily.      . Budesonide (UCERIS) 9 MG TB24 Take 1 tablet by mouth daily.  30 tablet  1  . dicyclomine (BENTYL) 10 MG capsule Take 1 tab 3 times daily for cramping and spasms.  50 capsule  0  . mesalamine (ROWASA) 4 G enema Place 60 mLs (4 g total) rectally every morning.  25 Bottle  2  . predniSONE (DELTASONE) 20 MG tablet Take 1 1/2 tab ( 30 mg )  by mouth every AM.  45 tablet  1   No current facility-administered medications on file prior to visit.    Allergies: No Known Allergies  History   Social History  . Marital Status: Single    Spouse Name: N/A    Number of Children: 2  . Years of Education: N/A   Occupational History  . Nurse    Social History Main Topics  . Smoking status: Never Smoker   . Smokeless tobacco: Never Used  . Alcohol Use: No  . Drug Use: No  . Sexually Active: Not on file   Other Topics Concern  . Not on file   Social History Narrative  . No narrative on file    Family History  Problem Relation Age of Onset  . Breast cancer Mother   . Hyperlipidemia Mother   . Glaucoma Mother   . Macular degeneration Father   . Lung cancer Father 36  . Colon cancer Neg Hx      Review of Systems:  See HPI for pertinent ROS. All other ROS negative.    Physical Exam: Blood pressure 104/70, pulse 68, temperature 97.3 F (36.3 C), temperature source Oral, resp. rate 16, height 5' 3.5" (1.613 m), weight 162 lb (73.483 kg), last menstrual period 08/01/2012., Body mass index is 28.24 kg/(m^2). General:WF.  Appears in no acute distress.Sclera nonicteric. Lungs: Clear bilaterally to auscultation without wheezes, rales, or rhonchi. Breathing is unlabored. Heart: RRR with S1 S2. No murmurs, rubs, or gallops. Abdomen: Soft, non-distended with normoactive bowel sounds. No hepatomegaly. No obvious abdominal masses.  Moderate tenderness with palpation of right upper quadrant only. No tenderness with palpation of epigastrium or remainder of abdomen. Musculoskeletal:  Strength and tone normal for age. Extremities/Skin: Warm and dry.No jaundice. Neuro: Alert and oriented X 3. Moves all extremities spontaneously. Gait is normal. CNII-XII grossly in tact. Psych:  Responds to questions appropriately with a normal affect.     ASSESSMENT AND PLAN:  47 y.o. year old female with  1. Abdominal pain, chronic, right upper quadrant - US Abdomen Limited RUQ; Future - CBC with Differential - COMPLETE METABOLIC PANEL WITH GFR - Amylase - Lipase - Helicobacter pylori abs-IgG+IgA, bld  2. Nausea alone - US Abdomen Limited RUQ; Future - CBC with Differential - COMPLETE METABOLIC PANEL WITH GFR - Amylase - Lipase - Helicobacter pylori abs-IgG+IgA, bld  Will obtain labs and U/S. Symptoms c/w cholecystitis. If U/S negative, would consider HIDA. Avoid fatty foods. If symptoms worsen or if develops fever, f/u immediately or go to ER.  Marin Olp Darwin, [place], [facility] 08/01/2012 4:08 PM

## 2012-08-02 LAB — COMPLETE METABOLIC PANEL WITH GFR
Albumin: 4.7 g/dL (ref 3.5–5.2)
Alkaline Phosphatase: 49 U/L (ref 39–117)
BUN: 8 mg/dL (ref 6–23)
Calcium: 9.9 mg/dL (ref 8.4–10.5)
Chloride: 102 mEq/L (ref 96–112)
GFR, Est Non African American: >89 mL/min
Glucose, Bld: 117 mg/dL — ABNORMAL HIGH (ref 70–99)
Potassium: 4 mEq/L (ref 3.5–5.3)
Sodium: 138 mEq/L (ref 135–145)
Total Protein: 7.7 g/dL (ref 6.0–8.3)

## 2012-08-02 LAB — CBC WITH DIFFERENTIAL/PLATELET
Basophils Relative: 1 % (ref 0–1)
HCT: 41.3 % (ref 36.0–46.0)
Hemoglobin: 14.5 g/dL (ref 12.0–15.0)
Lymphocytes Relative: 32 % (ref 12–46)
Lymphs Abs: 1.6 10*3/uL (ref 0.7–4.0)
MCHC: 35.1 g/dL (ref 30.0–36.0)
Monocytes Absolute: 0.4 10*3/uL (ref 0.1–1.0)
Monocytes Relative: 8 % (ref 3–12)
Neutro Abs: 2.8 10*3/uL (ref 1.7–7.7)
Neutrophils Relative %: 57 % (ref 43–77)
RBC: 4.47 MIL/uL (ref 3.87–5.11)
WBC: 5 10*3/uL (ref 4.0–10.5)

## 2012-08-02 LAB — HELICOBACTER PYLORI ABS-IGG+IGA, BLD: HELICOBACTER PYLORI AB, IGA: 1.6 U/mL (ref <9.0)

## 2012-08-05 ENCOUNTER — Ambulatory Visit
Admission: RE | Admit: 2012-08-05 | Discharge: 2012-08-05 | Disposition: A | Discharge status: Home / Self Care | Payer: BC Managed Care – PPO | Source: Ambulatory Visit | Attending: Physician Assistant | Admitting: Physician Assistant

## 2012-08-05 DIAGNOSIS — R110 Nausea: Secondary | ICD-10-CM

## 2012-08-05 DIAGNOSIS — G8929 Other chronic pain: Secondary | ICD-10-CM

## 2012-08-10 ENCOUNTER — Telehealth: Payer: Self-pay | Admitting: *Deleted

## 2012-08-10 NOTE — Telephone Encounter (Signed)
This mail order pharmacy called and said it is a interaction with Humeria and 6MP  Ref# is [id number]    The two medications together can cause Tumors

## 2012-08-11 NOTE — Telephone Encounter (Signed)
Very low incidence, which may occur after many years of therapy.  She can return for OV if she has further concerns.

## 2012-08-12 NOTE — Telephone Encounter (Signed)
Discussed with pt the interactions     She understands and wants to continue the medications  Feels better on humeria .Marland KitchenMarland KitchenMarland KitchenMarland Kitchen

## 2012-08-19 ENCOUNTER — Other Ambulatory Visit: Payer: Self-pay

## 2012-08-19 DIAGNOSIS — Z1231 Encounter for screening mammogram for malignant neoplasm of breast: Secondary | ICD-10-CM

## 2012-09-20 ENCOUNTER — Ambulatory Visit
Admission: RE | Admit: 2012-09-20 | Discharge: 2012-09-20 | Disposition: A | Discharge status: Home / Self Care | Payer: BC Managed Care – PPO | Source: Ambulatory Visit

## 2012-09-20 DIAGNOSIS — Z1231 Encounter for screening mammogram for malignant neoplasm of breast: Secondary | ICD-10-CM

## 2012-10-04 ENCOUNTER — Other Ambulatory Visit: Payer: Self-pay | Admitting: Physician Assistant

## 2012-10-04 NOTE — Telephone Encounter (Signed)
Med refill

## 2012-10-12 ENCOUNTER — Telehealth: Payer: Self-pay | Admitting: Physician Assistant

## 2012-10-12 MED ORDER — CITALOPRAM HYDROBROMIDE 20 MG PO TABS: 20.0000 mg | ORAL_TABLET | Freq: Every day | ORAL | Status: DC

## 2012-10-12 NOTE — Telephone Encounter (Signed)
Medication refilled per protocol. 

## 2013-01-11 ENCOUNTER — Other Ambulatory Visit: Payer: Self-pay | Admitting: Gastroenterology

## 2013-01-13 NOTE — Telephone Encounter (Signed)
Yes

## 2013-01-13 NOTE — Telephone Encounter (Signed)
Can this be filled linda

## 2013-03-06 ENCOUNTER — Encounter: Payer: Self-pay | Admitting: Physician Assistant

## 2013-03-06 ENCOUNTER — Ambulatory Visit (INDEPENDENT_AMBULATORY_CARE_PROVIDER_SITE_OTHER): Payer: BC Managed Care – PPO | Admitting: Physician Assistant

## 2013-03-06 VITALS — BP 114/76 | HR 72 | Temp 98.1°F | Resp 18 | Ht 63.75 in | Wt 150.0 lb

## 2013-03-06 DIAGNOSIS — B9689 Other specified bacterial agents as the cause of diseases classified elsewhere: Secondary | ICD-10-CM

## 2013-03-06 DIAGNOSIS — J069 Acute upper respiratory infection, unspecified: Secondary | ICD-10-CM

## 2013-03-06 DIAGNOSIS — K5150 Left sided colitis without complications: Secondary | ICD-10-CM

## 2013-03-06 DIAGNOSIS — A499 Bacterial infection, unspecified: Secondary | ICD-10-CM

## 2013-03-06 MED ORDER — AMOXICILLIN-POT CLAVULANATE 875-125 MG PO TABS: 1.0000 | ORAL_TABLET | Freq: Two times a day (BID) | ORAL | Status: DC

## 2013-03-06 NOTE — Progress Notes (Signed)
Patient ID: [name] MRN: [id number], DOB: January 25, 1966, 47 y.o. Date of Encounter: 03/06/2013, 10:34 AM    Chief Complaint:  Chief Complaint  Patient presents with  . c/o sinus infection    rt ear ache, green secretions, congestion     HPI: 47 y.o. year old white female reports that she's been sick for 6 days. Has had a lot of pressure across her nasal bridge and maxillary sinuses. Right ear has been popping. She is getting a lot of green mucus from her nose. Yesterday she developed a fever of 100.1. Feels it is starting to go into her chest. 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 She has signed up to work for Christmas because she can get paid time  and a half and she is available to help. She works with patients needing IV antibiotics so she is staying out of work today but hopes to get better aortic or return to work. Also, she has ulcerative colitis. States that she hates to take antibiotics unless she really has to. However her bladder she has not required antibiotics in over one year.     Home Meds: See attached medication section for any medications that were entered at today's visit. The computer does not put those onto this list.The following list is a list of meds entered prior to today's visit.   Current Outpatient Prescriptions on File Prior to Visit  Medication Sig Dispense Refill  . citalopram (CELEXA) 20 MG tablet Take 1 tablet (20 mg total) by mouth daily.  90 tablet  1  . HUMIRA PEN 40 MG/0.8ML injection INJECT 40MG (0.8ML) SUBCUTANEOUSLY ONCE EVERY OTHER WEEK  2 each  3  . mesalamine (LIALDA) 1.2 G EC tablet TAKE 4 TABLETS BY MOUTH ONCE A DAY.  360 tablet  11  . mesalamine (ROWASA) 4 G enema Place 60 mLs (4 g total) rectally every morning.  25 Bottle  2  . Multiple Vitamin (MULTIVITAMIN) tablet Take 1 tablet by mouth daily.      . norgestrel-ethinyl estradiol (LO/OVRAL,CRYSELLE) 0.3-30 MG-MCG tablet Take 1 tablet by mouth daily.      . Budesonide (UCERIS) 9 MG  TB24 Take 1 tablet by mouth daily.  30 tablet  1  . dicyclomine (BENTYL) 10 MG capsule Take 1 tab 3 times daily for cramping and spasms.  50 capsule  0  . mercaptopurine (PURINETHOL) 50 MG tablet Take 1 1/2 tablets by mouth once daily  135 tablet  11  . predniSONE (DELTASONE) 20 MG tablet Take 1 1/2 tab ( 30 mg )  by mouth every AM.  45 tablet  1   No current facility-administered medications on file prior to visit.    Allergies: No Known Allergies    Review of Systems: See HPI for pertinent ROS. All other ROS negative.    Physical Exam: Blood pressure 114/76, pulse 72, temperature 98.1 F (36.7 C), temperature source Oral, resp. rate 18, height 5' 3.75" (1.619 m), weight 150 lb (68.04 kg), last menstrual period 02/27/2013., Body mass index is 25.96 kg/(m^2). General:WNWD WF.   Appears in no acute distress. HEENT: Normocephalic, atraumatic, eyes without discharge, sclera non-icteric, nares are without discharge. Bilateral auditory canals clear, TM's are without perforation, pearly grey and translucent with reflective cone of light bilaterally. Oral cavity moist, posterior pharynx without exudate, erythema, peritonsillar abscess. Minimal tenderness with percussion of the maxillary sinuses. No tenderness with percussion of frontal sinus.  Neck: Supple. No thyromegaly. No lymphadenopathy. Lungs: Clear  bilaterally to auscultation without wheezes, rales, or rhonchi. Breathing is unlabored. Heart: Regular rhythm. No murmurs, rubs, or gallops. Msk:  Strength and tone normal for age. Extremities/Skin: Warm and dry. No clubbing or cyanosis. No edema. No rashes or suspicious lesions. Neuro: Alert and oriented X 3. Moves all extremities spontaneously. Gait is normal. CNII-XII grossly in tact. Psych:  Responds to questions appropriately with a normal affect.     ASSESSMENT AND PLAN:  47 y.o. year old female with  1. Bacterial upper respiratory infection - amoxicillin-clavulanate (AUGMENTIN)  875-125 MG per tablet; Take 1 tablet by mouth 2 (two) times daily.  Dispense: 14 tablet; Refill: 0  2. LEFT SIDED ULCERATIVE COLITIS She says that this is stable and well controlled currently. Discussed using probiotics. She says in the past this did not help she does plan to eat yougart while on antibiotics.  Note given for out of work today. Followup as needed.    [address] [place], [place], [facility] 03/06/2013 10:34 AM

## 2013-03-11 ENCOUNTER — Other Ambulatory Visit: Payer: Self-pay | Admitting: Physician Assistant

## 2013-03-13 ENCOUNTER — Telehealth: Payer: Self-pay | Admitting: Gastroenterology

## 2013-03-13 ENCOUNTER — Other Ambulatory Visit: Payer: Self-pay | Admitting: Gastroenterology

## 2013-03-13 DIAGNOSIS — K5150 Left sided colitis without complications: Secondary | ICD-10-CM

## 2013-03-13 MED ORDER — MESALAMINE 1.2 G PO TBEC: DELAYED_RELEASE_TABLET | ORAL | Status: DC

## 2013-03-13 NOTE — Telephone Encounter (Signed)
Medication refilled per protocol. 

## 2013-03-13 NOTE — Telephone Encounter (Signed)
Sent Lialda to Pt's mail order phamracy. Pt will need a follow up office visit with Dr. Deatra Ina for future refills

## 2013-05-08 ENCOUNTER — Other Ambulatory Visit: Payer: Self-pay | Admitting: Gastroenterology

## 2013-05-24 ENCOUNTER — Encounter: Payer: Self-pay | Admitting: Family Medicine

## 2013-05-24 ENCOUNTER — Other Ambulatory Visit: Payer: Self-pay | Admitting: Physician Assistant

## 2013-05-24 NOTE — Telephone Encounter (Signed)
Medication refill for one time only.  Patient needs to be seen.  Letter sent for patient to call and schedule 

## 2013-07-13 ENCOUNTER — Telehealth: Payer: Self-pay | Admitting: Gastroenterology

## 2013-07-13 ENCOUNTER — Encounter: Payer: Self-pay | Admitting: *Deleted

## 2013-07-13 NOTE — Telephone Encounter (Signed)
Pt has UC and is on humira. States that she has started having abdominal pain above belly button along with lots of nausea. Pt states that for the past 4 days her bowel movements have been a chalky white in color. Pt scheduled to see Alonza Bogus PA tomorrow at 2pm. Pt aware of appt.

## 2013-07-14 ENCOUNTER — Other Ambulatory Visit (INDEPENDENT_AMBULATORY_CARE_PROVIDER_SITE_OTHER): Payer: BC Managed Care – PPO

## 2013-07-14 ENCOUNTER — Encounter: Payer: Self-pay | Admitting: Gastroenterology

## 2013-07-14 ENCOUNTER — Ambulatory Visit (INDEPENDENT_AMBULATORY_CARE_PROVIDER_SITE_OTHER): Payer: BC Managed Care – PPO | Admitting: Gastroenterology

## 2013-07-14 VITALS — BP 118/70 | HR 68 | Ht 63.75 in | Wt 150.0 lb

## 2013-07-14 DIAGNOSIS — R1011 Right upper quadrant pain: Secondary | ICD-10-CM

## 2013-07-14 DIAGNOSIS — R110 Nausea: Secondary | ICD-10-CM | POA: Insufficient documentation

## 2013-07-14 LAB — COMPREHENSIVE METABOLIC PANEL
ALT: 18 U/L (ref 0–35)
AST: 22 U/L (ref 0–37)
Albumin: 3.9 g/dL (ref 3.5–5.2)
Alkaline Phosphatase: 51 U/L (ref 39–117)
BUN: 12 mg/dL (ref 6–23)
CALCIUM: 9 mg/dL (ref 8.4–10.5)
CHLORIDE: 104 meq/L (ref 96–112)
CO2: 23 meq/L (ref 19–32)
CREATININE: 0.6 mg/dL (ref 0.4–1.2)
GFR: 116.59 mL/min (ref >60.00)
Glucose, Bld: 118 mg/dL — ABNORMAL HIGH (ref 70–99)
Potassium: 3.3 mEq/L — ABNORMAL LOW (ref 3.5–5.1)
Sodium: 136 mEq/L (ref 135–145)
Total Bilirubin: 0.4 mg/dL (ref 0.3–1.2)
Total Protein: 7.6 g/dL (ref 6.0–8.3)

## 2013-07-14 LAB — CBC
HCT: 39.9 % (ref 36.0–46.0)
Hemoglobin: 13.4 g/dL (ref 12.0–15.0)
MCHC: 33.6 g/dL (ref 30.0–36.0)
MCV: 89.6 fl (ref 78.0–100.0)
PLATELETS: 357 10*3/uL (ref 150.0–400.0)
RBC: 4.45 Mil/uL (ref 3.87–5.11)
RDW: 13 % (ref 11.5–14.6)
WBC: 5.5 10*3/uL (ref 4.5–10.5)

## 2013-07-14 LAB — LIPASE: LIPASE: 21 U/L (ref 11.0–59.0)

## 2013-07-14 LAB — AMYLASE: Amylase: 63 U/L (ref 27–131)

## 2013-07-14 MED ORDER — OMEPRAZOLE 40 MG PO CPDR: 40.0000 mg | DELAYED_RELEASE_CAPSULE | Freq: Every day | ORAL | Status: DC

## 2013-07-14 NOTE — Progress Notes (Signed)
07/14/2013 [name] [id number] 1966-01-11   History of Present Illness:  This is a pleasant 48 year old female who is known to Dr. Deatra Ina for the treatment of her ulcerative colitis. She presents to our office today with complaints of right upper quadrant abdominal pain and nausea. The pain/discomfort is described as a 3-4/10 on the pain scale and is sore/aching.  The pain has been constant since it began recently, however, it does worsen with eating and seems to be worse at night but does not keep her from sleeping or wake her up in the middle of the night. She has also been experiencing nausea for the past 2 weeks.  She states that she has experienced similar symptoms in the past. She had an ultrasound in May 2014 that was normal.  She denies any heartburn/reflux symptoms. She does not take any NSAIDs. She has also noted that her stools have been very light gray/clay-colored recently and is unsure if that's related.  Current Medications, Allergies, Past Medical History, Past Surgical History, Family History and Social History were reviewed in [employer] record.   Physical Exam: BP 118/70  Pulse 68  Ht 5' 3.75" (1.619 m)  Wt 150 lb (68.04 kg)  BMI 25.96 kg/m2 General: Well developed white female in no acute distress Head: Normocephalic and atraumatic Eyes:  Sclerae anicteric, conjunctiva pink  Ears: Normal auditory acuity Lungs: Clear throughout to auscultation Heart: Regular rate and rhythm Abdomen: Soft, non-distended.  Normal bowel sounds.  Mild RUQ and epigastric TTP without R/R/G. Musculoskeletal: Symmetrical with no gross deformities  Extremities: No edema  Neurological: Alert oriented x 4, grossly non-focal Psychological:  Alert and cooperative. Normal mood and affect  Assessment and Recommendations: -RUQ/epigastric abdominal pain and nausea:  ? Dysfunctional gallbladder vs PUD, etc.  Will check labs including CBC, CMP, amylase, and lipase.  Will check  HIDA scan.  Ultrasound was normal in 07/2012.  Will start omeprazole 40 mg daily.

## 2013-07-14 NOTE — Patient Instructions (Addendum)
We have sent the following medications to your pharmacy for you to pick up at your convenience: Omeprazole 40 mg, please take one capsule by mouth once daily   Your physician has requested that you go to the basement for the following lab work before leaving today: CBC CMP Amylase Lipase ______________________________________________________________________________________________________________________________________________________________  [name] have been scheduled for a HIDA scan at [facility] (1st floor) on 07-25-2013. Please arrive 15 minutes prior to your scheduled appointment at 800 am. Make certain not to have anything to eat or drink at least 6 hours prior to your test. Should this appointment date or time not work well for you, please call radiology scheduling at [phone].  _____________________________________________________________________ hepatobiliary (HIDA) scan is an imaging procedure used to diagnose problems in the liver, gallbladder and bile ducts. In the HIDA scan, a radioactive chemical or tracer is injected into a vein in your arm. The tracer is handled by the liver like bile. Bile is a fluid produced and excreted by your liver that helps your digestive system break down fats in the foods you eat. Bile is stored in your gallbladder and the gallbladder releases the bile when you eat a meal. A special nuclear medicine scanner (gamma camera) tracks the flow of the tracer from your liver into your gallbladder and small intestine.  During your HIDA scan  You'll be asked to change into a hospital gown before your HIDA scan begins. Your health care team will position you on a table, usually on your back. The radioactive tracer is then injected into a vein in your arm.The tracer travels through your bloodstream to your liver, where it's taken up by the bile-producing cells. The radioactive tracer travels with the bile from your liver into your gallbladder and through your  bile ducts to your small intestine.You may feel some pressure while the radioactive tracer is injected into your vein. As you lie on the table, a special gamma camera is positioned over your abdomen taking pictures of the tracer as it moves through your body. The gamma camera takes pictures continually for about an hour. You'll need to keep still during the HIDA scan. This can become uncomfortable, but you may find that you can lessen the discomfort by taking deep breaths and thinking about other things. Tell your health care team if you're uncomfortable. The radiologist will watch on a computer the progress of the radioactive tracer through your body. The HIDA scan may be stopped when the radioactive tracer is seen in the gallbladder and enters your small intestine. This typically takes about an hour. In some cases extra imaging will be performed if original images aren't satisfactory, if morphine is given to help visualize the gallbladder or if the medication CCK is given to look at the contraction of the gallbladder. This test typically takes 2 hours to complete. ________________________________________________________________________

## 2013-07-17 NOTE — Progress Notes (Signed)
If HIDA scan is normal (LFTs are normal) she should have an upper endoscopy

## 2013-07-25 ENCOUNTER — Ambulatory Visit (HOSPITAL_COMMUNITY)
Admission: RE | Admit: 2013-07-25 | Discharge: 2013-07-25 | Disposition: A | Discharge status: Home / Self Care | Payer: BC Managed Care – PPO | Source: Ambulatory Visit | Attending: Gastroenterology | Admitting: Gastroenterology

## 2013-07-25 DIAGNOSIS — R110 Nausea: Secondary | ICD-10-CM

## 2013-07-25 DIAGNOSIS — R1011 Right upper quadrant pain: Secondary | ICD-10-CM

## 2013-07-25 DIAGNOSIS — R109 Unspecified abdominal pain: Secondary | ICD-10-CM | POA: Insufficient documentation

## 2013-07-25 MED ORDER — TECHNETIUM TC 99M MEBROFENIN IV KIT: 5.5000 | PACK | Freq: Once | INTRAVENOUS | Status: AC | PRN

## 2013-07-25 MED ORDER — TECHNETIUM TC 99M MEBROFENIN IV KIT
5.5000 | PACK | Freq: Once | INTRAVENOUS | Status: AC | PRN
  Administered 2013-07-25: 6 via INTRAVENOUS

## 2013-07-28 ENCOUNTER — Telehealth: Payer: Self-pay | Admitting: Gastroenterology

## 2013-07-28 NOTE — Telephone Encounter (Signed)
Spoke with patient and told her I will call her in a couple of weeks and schedule her with Alonza Bogus, PA since Dr. Deatra Ina is booked until end of July. Patient agrees with this.

## 2013-08-02 ENCOUNTER — Telehealth: Payer: Self-pay | Admitting: Gastroenterology

## 2013-08-02 MED ORDER — SUCRALFATE 1 G PO TABS: 1.0000 g | ORAL_TABLET | Freq: Four times a day (QID) | ORAL | Status: DC

## 2013-08-02 NOTE — Telephone Encounter (Signed)
Spoke with patient and she is still having pain. She is asking if she can try Carafate and if she can have Bentyl. Please, advise.

## 2013-08-02 NOTE — Telephone Encounter (Signed)
Spoke with patient and gave her recommendation. Rx sent to pharmacy.

## 2013-08-02 NOTE — Telephone Encounter (Signed)
I would like to try one thing at a time so that we can determine what helps and what doesn't.  Let's try carafate tab four times per day to start.  Thank you,  Jess

## 2013-08-14 ENCOUNTER — Telehealth: Payer: Self-pay | Admitting: *Deleted

## 2013-08-14 NOTE — Telephone Encounter (Signed)
Left a message for patient to call me for OV with Janett Billow to be scheduled.

## 2013-08-14 NOTE — Telephone Encounter (Signed)
Message copied by Hulan Saas on Mon Aug 14, 2013 12:48 PM ------      Message from: Hulan Saas      Created: Fri Jul 28, 2013 10:06 AM       Call patient with f/u OV with Alonza Bogus, PA She would like to be scheduled the week of June 22 ------

## 2013-08-15 NOTE — Telephone Encounter (Signed)
Spoke with patient and scheduled OV with Alonza Bogus, PA on 09/08/13 at 8:30 AM.

## 2013-08-25 ENCOUNTER — Other Ambulatory Visit: Payer: Self-pay | Admitting: Gastroenterology

## 2013-09-08 ENCOUNTER — Ambulatory Visit: Payer: BC Managed Care – PPO | Admitting: Gastroenterology

## 2013-10-03 ENCOUNTER — Other Ambulatory Visit: Payer: Self-pay | Admitting: Gastroenterology

## 2013-10-26 ENCOUNTER — Other Ambulatory Visit: Payer: Self-pay

## 2013-10-26 DIAGNOSIS — Z1231 Encounter for screening mammogram for malignant neoplasm of breast: Secondary | ICD-10-CM

## 2013-10-31 ENCOUNTER — Ambulatory Visit
Admission: RE | Admit: 2013-10-31 | Discharge: 2013-10-31 | Disposition: A | Discharge status: Home / Self Care | Payer: BC Managed Care – PPO | Source: Ambulatory Visit

## 2013-10-31 ENCOUNTER — Encounter (INDEPENDENT_AMBULATORY_CARE_PROVIDER_SITE_OTHER): Payer: Self-pay

## 2013-10-31 DIAGNOSIS — Z1231 Encounter for screening mammogram for malignant neoplasm of breast: Secondary | ICD-10-CM

## 2013-11-01 ENCOUNTER — Other Ambulatory Visit: Payer: Self-pay | Admitting: Gastroenterology

## 2013-12-28 ENCOUNTER — Telehealth: Payer: Self-pay | Admitting: Gastroenterology

## 2013-12-28 NOTE — Telephone Encounter (Signed)
I have left message for the patient to call back

## 2014-01-01 ENCOUNTER — Other Ambulatory Visit: Payer: Self-pay | Admitting: Gastroenterology

## 2014-01-01 NOTE — Telephone Encounter (Signed)
Symptoms for 3 weeks. She still denies nausea, vomiting or fever.

## 2014-01-01 NOTE — Telephone Encounter (Signed)
Ok

## 2014-01-01 NOTE — Telephone Encounter (Signed)
Patient is changing jobs and will have to call back. She will either take COBRA or wait for the new insurance.

## 2014-01-01 NOTE — Telephone Encounter (Signed)
How long it is this going on for?  Does she have fever, nausea or vomiting?

## 2014-01-01 NOTE — Telephone Encounter (Signed)
See if you can schedule her for a sigmoidoscopy tomorrow at 7:30 at [facility]

## 2014-01-01 NOTE — Telephone Encounter (Signed)
Patient reports sudden onset of cramps and urgency that she previously was not experiencing. It happen 2-3 times a day. There is no recent ATB use, no nausea, no fever, no blood. She is afraid to go out or to go for walks because there is no warning. She is coming to see you for her regular check up in December. Please advise.

## 2014-01-01 NOTE — Telephone Encounter (Signed)
Not able to do this tomorrow. Want to go with 01/09/14? I had a cancellation on that day, so there is an opening.

## 2014-02-22 ENCOUNTER — Ambulatory Visit: Payer: BC Managed Care – PPO | Admitting: Gastroenterology

## 2014-05-01 ENCOUNTER — Ambulatory Visit (INDEPENDENT_AMBULATORY_CARE_PROVIDER_SITE_OTHER): Payer: BLUE CROSS/BLUE SHIELD | Admitting: Family Medicine

## 2014-05-01 ENCOUNTER — Encounter: Payer: Self-pay | Admitting: Family Medicine

## 2014-05-01 VITALS — BP 112/58 | HR 64 | Temp 98.3°F | Resp 12 | Ht 64.0 in | Wt 142.0 lb

## 2014-05-01 DIAGNOSIS — J0100 Acute maxillary sinusitis, unspecified: Secondary | ICD-10-CM

## 2014-05-01 MED ORDER — DOXYCYCLINE HYCLATE 100 MG PO TABS: 100.0000 mg | ORAL_TABLET | Freq: Two times a day (BID) | ORAL | Status: DC

## 2014-05-01 MED ORDER — METHYLPREDNISOLONE ACETATE 40 MG/ML IJ SUSP
40.0000 mg | Freq: Once | INTRAMUSCULAR | Status: AC
  Administered 2014-05-01: 40 mg via INTRAMUSCULAR

## 2014-05-01 NOTE — Progress Notes (Signed)
Patient ID: [name], female   DOB: 1965-04-12, 49 y.o.   MRN: [id number]   Subjective:    Patient ID: [name], female    DOB: 03/09/1966, 49 y.o.   MRN: [id number]  Patient presents for Illness patient here with sinus pressure and drainage worse on the right side for the past 4 days. She was actually treated for sinusitis back in January by the [facility] she was given amoxicillin. She did not improve after the 10 day course therefore she was given Augmentin and she did clear up some but now her symptoms have returned. She denies any significant cough she does have some postnasal drip. She's had rhinoplasty greater than 20 years ago but in the past few years has had more difficulty with her sinuses. She has not had any fever recently.    Review Of Systems:  GEN- denies fatigue, fever, weight loss,weakness, recent illness HEENT- denies eye drainage, change in vision, + nasal discharge, CVS- denies chest pain, palpitations RESP- denies SOB, cough, wheeze ABD- denies N/V, change in stools, abd pain GU- denies dysuria, hematuria, dribbling, incontinence MSK- denies joint pain, muscle aches, injury Neuro- denies headache, dizziness, syncope, seizure activity       Objective:    BP 112/58 mmHg  Pulse 64  Temp(Src) 98.3 F (36.8 C) (Oral)  Resp 12  Ht 5' 4"  (1.626 m)  Wt 142 lb (64.411 kg)  BMI 24.36 kg/m2  LMP 04/25/2014 GEN- NAD, alert and oriented x3 HEENT- PERRL, EOMI, non injected sclera, pink conjunctiva, MMM, oropharynx mild injection, TM clear bilat no effusion,  + maxillary sinus tenderness R>l, inflammed turbinates,  Nasal drainage  Neck- Supple, no LAD CVS- RRR, no murmur RESP-CTAB EXT- No edema Pulses- Radial 2+          Assessment & Plan:      Problem List Items Addressed This Visit    None    Visit Diagnoses    Acute maxillary sinusitis, recurrence not specified    -  Primary    Treat with doxycline due to recurrent symptoms, will also give  shot of Depo Medrol, mucinex and Netty pot    Relevant Medications    DOXYCYCLINE HYCLATE 100 MG PO TABS    methylPREDNISolone acetate (DEPO-MEDROL) injection 40 mg (Completed)       Note: This dictation was prepared with Dragon dictation along with smaller phrase technology. Any transcriptional errors that result from this process are unintentional.

## 2014-05-01 NOTE — Patient Instructions (Signed)
Depo medrol shot given Take antibiotics Use nettypot Mucinex F/U as needed

## 2014-05-28 ENCOUNTER — Telehealth: Payer: Self-pay | Admitting: Gastroenterology

## 2014-05-28 ENCOUNTER — Other Ambulatory Visit: Payer: Self-pay

## 2014-05-28 MED ORDER — ADALIMUMAB 40 MG/0.8ML ~~LOC~~ AJKT: 1.0000 | AUTO-INJECTOR | SUBCUTANEOUS | Status: DC

## 2014-06-01 NOTE — Telephone Encounter (Signed)
Humira requiring a prior authorization. Approval obtained through "covermymeds.com" - Patient contacted with this information.

## 2014-06-07 ENCOUNTER — Other Ambulatory Visit: Payer: Self-pay | Admitting: Physician Assistant

## 2014-06-07 ENCOUNTER — Encounter: Payer: Self-pay | Admitting: Physician Assistant

## 2014-06-07 ENCOUNTER — Ambulatory Visit (INDEPENDENT_AMBULATORY_CARE_PROVIDER_SITE_OTHER): Payer: BLUE CROSS/BLUE SHIELD | Admitting: Physician Assistant

## 2014-06-07 ENCOUNTER — Ambulatory Visit
Admission: RE | Admit: 2014-06-07 | Discharge: 2014-06-07 | Disposition: A | Discharge status: Home / Self Care | Payer: BLUE CROSS/BLUE SHIELD | Source: Ambulatory Visit | Attending: Physician Assistant | Admitting: Physician Assistant

## 2014-06-07 VITALS — BP 96/58 | HR 88 | Temp 98.2°F | Resp 18 | Wt 133.0 lb

## 2014-06-07 DIAGNOSIS — K5150 Left sided colitis without complications: Secondary | ICD-10-CM | POA: Diagnosis not present

## 2014-06-07 DIAGNOSIS — R509 Fever, unspecified: Secondary | ICD-10-CM

## 2014-06-07 DIAGNOSIS — M609 Myositis, unspecified: Secondary | ICD-10-CM | POA: Diagnosis not present

## 2014-06-07 DIAGNOSIS — IMO0001 Reserved for inherently not codable concepts without codable children: Secondary | ICD-10-CM

## 2014-06-07 DIAGNOSIS — M791 Myalgia: Secondary | ICD-10-CM

## 2014-06-07 LAB — URINALYSIS, MICROSCOPIC ONLY
CRYSTALS: NONE SEEN
Casts: NONE SEEN
RBC / HPF: NONE SEEN RBC/hpf (ref <3)
WBC, UA: NONE SEEN WBC/hpf (ref <3)

## 2014-06-07 LAB — URINALYSIS, ROUTINE W REFLEX MICROSCOPIC
Bilirubin Urine: NEGATIVE
Glucose, UA: NEGATIVE mg/dL
HGB URINE DIPSTICK: NEGATIVE
Leukocytes, UA: NEGATIVE
NITRITE: NEGATIVE
Protein, ur: 30 mg/dL — AB
Specific Gravity, Urine: >1.03 — ABNORMAL HIGH (ref 1.005–1.030)
UROBILINOGEN UA: 0.2 mg/dL (ref 0.0–1.0)
pH: 5.5 (ref 5.0–8.0)

## 2014-06-07 LAB — COMPLETE METABOLIC PANEL WITH GFR
ALBUMIN: 3.4 g/dL — AB (ref 3.5–5.2)
ALT: 9 U/L (ref 0–35)
AST: 13 U/L (ref 0–37)
Alkaline Phosphatase: 50 U/L (ref 39–117)
BUN: 10 mg/dL (ref 6–23)
CALCIUM: 8.8 mg/dL (ref 8.4–10.5)
CHLORIDE: 102 meq/L (ref 96–112)
CO2: 21 meq/L (ref 19–32)
CREATININE: 0.58 mg/dL (ref 0.50–1.10)
GLUCOSE: 123 mg/dL — AB (ref 70–99)
POTASSIUM: 4.1 meq/L (ref 3.5–5.3)
Sodium: 138 mEq/L (ref 135–145)
Total Bilirubin: 0.3 mg/dL (ref 0.2–1.2)
Total Protein: 6.4 g/dL (ref 6.0–8.3)

## 2014-06-07 LAB — CBC WITH DIFFERENTIAL/PLATELET
BASOS PCT: 0 % (ref 0–1)
Basophils Absolute: 0 10*3/uL (ref 0.0–0.1)
EOS ABS: 0.1 10*3/uL (ref 0.0–0.7)
EOS PCT: 1 % (ref 0–5)
HEMATOCRIT: 37.2 % (ref 36.0–46.0)
HEMOGLOBIN: 12.3 g/dL (ref 12.0–15.0)
LYMPHS ABS: 1.1 10*3/uL (ref 0.7–4.0)
Lymphocytes Relative: 16 % (ref 12–46)
MCH: 29 pg (ref 26.0–34.0)
MCHC: 33.1 g/dL (ref 30.0–36.0)
MCV: 87.7 fL (ref 78.0–100.0)
MPV: 9.1 fL (ref 8.6–12.4)
Monocytes Absolute: 0.6 10*3/uL (ref 0.1–1.0)
Monocytes Relative: 8 % (ref 3–12)
NEUTROS PCT: 75 % (ref 43–77)
Neutro Abs: 5.3 10*3/uL (ref 1.7–7.7)
Platelets: 363 10*3/uL (ref 150–400)
RBC: 4.24 MIL/uL (ref 3.87–5.11)
RDW: 13.6 % (ref 11.5–15.5)
WBC: 7 10*3/uL (ref 4.0–10.5)

## 2014-06-07 LAB — TSH: TSH: 0.689 u[IU]/mL (ref 0.350–4.500)

## 2014-06-07 NOTE — Addendum Note (Signed)
Addended by: Kohlton Gilpatrick, [place] on: 06/07/2014 04:53 PM   Modules accepted: Orders

## 2014-06-07 NOTE — Progress Notes (Signed)
Patient ID: ZAYAH KEILMAN MRN: [id number], DOB: July 10, 1965, 49 y.o. Date of Encounter: @DATE @  Chief Complaint:  Chief Complaint  Patient presents with  . sick x 8 days    fever up/down, chills, chest hurts, wt loss    HPI: 49 y.o. year old white female  presents with above symptoms.   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  She states that New Year's Eve she went to the [facility] because of sinus infection. Prescribed Augmentin for 1 week. She ended up having to call them back and they had to give her another week of Augmentin at a higher strength.  Says that those symptoms finally resolved but then they returned and she ended up seeing Dr. Buelah Manis and was given doxycycline and prednisone.  Says that after that she developed ulcerative colitis flare. Says that she has called her GI doctor but her appointment with them is not until May.  Says that she has been doing what she usually has does when she has a flare and has been trying to give bowel rest and have just clear liquid diet.  Says that she just has not been feeling good. Has had fevers and chills off and on. Maximum temperature was 101.8 last week. She has body aches and just feels tired and just doesn't feel good.  Says that she does pass quite a bit of blood like she always does when her ulcerative colitis flares. Wants to check lab to check her blood count.  Says that she has been on Humira for over a year and that this is not a new medication to her. States that she has never had these symptoms in the past even since she has been on Humira.  Does not know whether the symptoms could be related to being on Humira Also does note that she works with really sick patients in the hospital.  States that she has had no cough. Has had no symptoms of a urine infection. No dysuria frequency urgency. No vaginal discharge  or pelvic pain. No vomiting. No GI symptoms other than her usual symptoms she has with ulcerative colitis flare. No mucus from the nose no sinus pain. No sore throat or ear pain.   Past Medical History  Diagnosis Date  . Ulcerative colitis      Home Meds: Outpatient Prescriptions Prior to Visit  Medication Sig Dispense Refill  . Adalimumab (HUMIRA PEN) 40 MG/0.8ML PNKT Inject 1 Syringe into the skin every 14 (fourteen) days. 2 each PRN  . Cholecalciferol (VITAMIN D3) 2000 UNITS TABS Take 1 tablet by mouth daily.    . citalopram (CELEXA) 10 MG tablet Take 10 mg by mouth daily.    . Multiple Vitamin (MULTIVITAMIN) tablet Take 1 tablet by mouth daily.    . norgestrel-ethinyl estradiol (LO/OVRAL,CRYSELLE) 0.3-30 MG-MCG tablet Take 1 tablet by mouth daily.    Marland Kitchen doxycycline (VIBRA-TABS) 100 MG tablet Take 1 tablet (100 mg total) by mouth 2 (two) times daily. (Patient not taking: Reported on 06/07/2014) 14 tablet 0   No facility-administered medications prior to visit.    Allergies: No Known Allergies  History   Social History  . Marital Status: Single    Spouse Name: N/A  . Number of Children: 2  . Years of Education: N/A   Occupational History  . Nurse    Social  History Main Topics  . Smoking status: Never Smoker   . Smokeless tobacco: Never Used  . Alcohol Use: No  . Drug Use: No  . Sexual Activity: Not on file   Other Topics Concern  . Not on file   Social History Narrative    Family History  Problem Relation Age of Onset  . Breast cancer Mother   . Hyperlipidemia Mother   . Glaucoma Mother   . Macular degeneration Father   . Lung cancer Father 57  . Colon cancer Neg Hx      Review of Systems:  See HPI for pertinent ROS. All other ROS negative.    Physical Exam: Blood pressure 96/58, pulse 88, temperature 98.2 F (36.8 C), temperature source Oral, resp. rate 18, weight 133 lb (60.328 kg)., Body mass index is 22.82 kg/(m^2). General:WNWD WF.  NonToxic. Does  not appear ill. Appears in no acute distress. Head: Normocephalic, atraumatic, eyes without discharge, sclera non-icteric, nares are without discharge. Bilateral auditory canals clear, TM's are without perforation, pearly grey and translucent with reflective cone of light bilaterally. Oral cavity moist, posterior pharynx without exudate, erythema, peritonsillar abscess.  Neck: Supple. No thyromegaly. No lymphadenopathy. Lungs: Clear bilaterally to auscultation without wheezes, rales, or rhonchi. Breathing is unlabored. Heart: RRR with S1 S2. No murmurs, rubs, or gallops. Abdomen: Soft,  non-distended with normoactive bowel sounds. No hepatomegaly. No rebound/guarding. No obvious abdominal masses.No focal/localized tenderness. Says abdomen feels the way it always feels when she has UC flare.  Musculoskeletal:  Strength and tone normal for age. Extremities/Skin: Warm and dry.  No rashes. Neuro: Alert and oriented X 3. Moves all extremities spontaneously. Gait is normal. CNII-XII grossly in tact. Psych:  Responds to questions appropriately with a normal affect.     ASSESSMENT AND PLAN:  49 y.o. year old female with  1. Fever and chills - Urinalysis, Routine w reflex microscopic - Urine culture - CBC with Differential/Platelet - COMPLETE METABOLIC PANEL WITH GFR - TSH - DG Chest 2 View; Future - Culture, blood (single) - Culture, blood (single) - Clostridium difficile EIA; Future - Ova and parasite examination; Future - Stool culture; Future  2. Myalgia and myositis - Urinalysis, Routine w reflex microscopic - Urine culture - CBC with Differential/Platelet - COMPLETE METABOLIC PANEL WITH GFR - TSH - DG Chest 2 View; Future - Culture, blood (single) - Culture, blood (single) - Clostridium difficile EIA; Future - Ova and parasite examination; Future - Stool culture; Future  3. LEFT SIDED ULCERATIVE COLITIS - Urinalysis, Routine w reflex microscopic - Urine culture - CBC with  Differential/Platelet - COMPLETE METABOLIC PANEL WITH GFR - TSH - DG Chest 2 View; Future - Culture, blood (single) - Culture, blood (single) - Clostridium difficile EIA; Future - Ova and parasite examination; Future - Stool culture; Future   Will check urine Will check labs including blood cultures Will check chest x-ray to rule out "walking pneumonia" Will also check stool for C. difficile and other infections.  I am concerned that she could possibly be having C. difficile secondary to recurrent antibiotic use and her current symptoms could be secondary to C. difficile instead of her usual ulcerative colitis.  Will follow-up with her once we get the results of each test.  Signed, Karis Juba, [place], [facility] 06/07/2014 2:04 PM

## 2014-06-08 ENCOUNTER — Telehealth: Payer: Self-pay | Admitting: Family Medicine

## 2014-06-08 LAB — OVA AND PARASITE EXAMINATION: OP: NONE SEEN

## 2014-06-08 LAB — URINE CULTURE

## 2014-06-08 LAB — C. DIFFICILE GDH AND TOXIN A/B
C. DIFFICILE GDH: NOT DETECTED
C. difficile Toxin A/B: NOT DETECTED

## 2014-06-08 NOTE — Telephone Encounter (Signed)
Patient is calling to get her results from yesterdays chest xray and blood work  [phone]

## 2014-06-08 NOTE — Telephone Encounter (Signed)
Lab and xray reports still pending or in provider basket.  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

## 2014-06-11 LAB — STOOL CULTURE

## 2014-06-12 ENCOUNTER — Other Ambulatory Visit: Payer: Self-pay

## 2014-06-12 ENCOUNTER — Telehealth: Payer: Self-pay | Admitting: Gastroenterology

## 2014-06-12 MED ORDER — PREDNISONE 20 MG PO TABS: 40.0000 mg | ORAL_TABLET | Freq: Every day | ORAL | Status: DC

## 2014-06-12 NOTE — Telephone Encounter (Signed)
Spoke with pt and she is aware. Script sent to pharmacy and pt will call later in the week with update.

## 2014-06-12 NOTE — Telephone Encounter (Signed)
Pt has been on several rounds of antibiotics recently for sinus infections. Pt states she is now having a UC flare, states she saw her PCP last week and was tested for cdiff and it was negative. Pt is having bloody diarrhea about 10-12 times per day. She is having night sweats and running some temp. States she had a temp of 102 x2 in the past 2 weeks. She is aching all over and no energy, has lost 7 pounds in the past 2 weeks. Please advise.

## 2014-06-12 NOTE — Telephone Encounter (Signed)
Begin prednisone 40 mg daily Call back on Friday Florastor one tab daily Call back either Thursday or Friday Clear liquid diet until she is improving

## 2014-06-13 LAB — CULTURE, BLOOD (SINGLE)
ORGANISM ID, BACTERIA: NO GROWTH
Organism ID, Bacteria: NO GROWTH

## 2014-06-15 ENCOUNTER — Telehealth: Payer: Self-pay

## 2014-06-15 NOTE — Telephone Encounter (Signed)
Continue prednisone 40 mg daily and call back Monday or Tuesday of next week

## 2014-06-15 NOTE — Telephone Encounter (Signed)
Pt calling with an update since starting the florastor and prednisone 4m. Pt states she is better. She is still on clear liquids, there is still a little bit of BRB in the stool. Pt states she is still on clear liquids and she is now going to the bathroom about 5 times a day instead of 12. Do you want her to change anything? Please advise.

## 2014-06-15 NOTE — Telephone Encounter (Signed)
Spoke with pt and she is aware.

## 2014-06-19 ENCOUNTER — Telehealth: Payer: Self-pay | Admitting: Gastroenterology

## 2014-06-19 NOTE — Telephone Encounter (Signed)
Reports improvement. She does not have the aches and pains like she did. Stools have less blood and are more formed than before. She is eating very bland still. She has extreme stool urgency that becomes incontinence if she is not at a bathroom.  She asks if there is anything that can be done for this. It is painful.

## 2014-06-19 NOTE — Telephone Encounter (Signed)
hyomax 0.310m qd C/b Friday

## 2014-06-20 ENCOUNTER — Other Ambulatory Visit: Payer: Self-pay

## 2014-06-20 MED ORDER — HYOSCYAMINE SULFATE ER 0.375 MG PO TBCR: EXTENDED_RELEASE_TABLET | ORAL | Status: DC

## 2014-06-20 NOTE — Telephone Encounter (Signed)
Patient is advised. She will call back with an update by 06/22/14.

## 2014-06-22 NOTE — Telephone Encounter (Signed)
Improvement reported. She is not seeing any blood with the bowel movements. They are becoming more formed. She still cannot pass gas with confidence. Tolerating Hyomax. She wants to call back 06/26/14 and talk about tapering the prednisone if she is better. Okay with you?

## 2014-06-24 NOTE — Telephone Encounter (Signed)
Ok to lower pred to 87m qd.  C/ b Friday

## 2014-06-26 NOTE — Telephone Encounter (Signed)
Patient calls to report her progress. She is feeling better. She has not had any bleeding or urgency. She feels the Hycomax is helping. She will begin Prednisone 83m  Daily and call back in 7 days.

## 2014-07-03 ENCOUNTER — Telehealth: Payer: Self-pay | Admitting: Gastroenterology

## 2014-07-03 NOTE — Telephone Encounter (Signed)
Left sided UC. Hyomax and prednisone has gotten her symptoms under control. No diarrhea, no bloody stools or incontinence, no pain. She was on prednisone 40 mg. It was lowered to 30 mg. She has been stable for 7 days on prednisone 30 mg. Can it be tapered more?

## 2014-07-03 NOTE — Telephone Encounter (Signed)
Patient instructed. She wrote down her instructions. She will call when she needs prednisone 5 mg. Her tablets are 20 mg.

## 2014-07-03 NOTE — Telephone Encounter (Signed)
Lower to prednisone 20 mg daily for 7 days, then 15 mg daily for 7 days, then 10 mg daily for 7 days, then 10 alternating with 5 mg daily for 7 days, then 5 mg daily for 7 days, then 5 mg every other day for 7 days, then DC Office visit in 6-8 weeks

## 2014-08-09 ENCOUNTER — Ambulatory Visit: Payer: Self-pay | Admitting: Gastroenterology

## 2014-09-28 ENCOUNTER — Other Ambulatory Visit: Payer: Self-pay

## 2014-09-28 DIAGNOSIS — Z1231 Encounter for screening mammogram for malignant neoplasm of breast: Secondary | ICD-10-CM

## 2014-10-12 ENCOUNTER — Telehealth: Payer: Self-pay | Admitting: Family Medicine

## 2014-10-12 NOTE — Telephone Encounter (Signed)
Reviewed patient chart and [facility]. Noted only Hep B and Flu vaccines.   Record printed and will be available for pick-up at patient appointment on 10/16/2014.

## 2014-10-12 NOTE — Telephone Encounter (Signed)
Patient would like to get a copy of her immunization record. She would like to pick up next week.

## 2014-10-16 ENCOUNTER — Encounter: Payer: Self-pay | Admitting: Gastroenterology

## 2014-10-16 ENCOUNTER — Other Ambulatory Visit (INDEPENDENT_AMBULATORY_CARE_PROVIDER_SITE_OTHER): Payer: BLUE CROSS/BLUE SHIELD

## 2014-10-16 ENCOUNTER — Ambulatory Visit (INDEPENDENT_AMBULATORY_CARE_PROVIDER_SITE_OTHER): Payer: BLUE CROSS/BLUE SHIELD | Admitting: Gastroenterology

## 2014-10-16 VITALS — BP 96/60 | HR 72 | Ht 64.0 in | Wt 141.2 lb

## 2014-10-16 DIAGNOSIS — K5150 Left sided colitis without complications: Secondary | ICD-10-CM | POA: Diagnosis not present

## 2014-10-16 DIAGNOSIS — K5190 Ulcerative colitis, unspecified, without complications: Secondary | ICD-10-CM

## 2014-10-16 LAB — CBC WITH DIFFERENTIAL/PLATELET
Basophils Absolute: 0 10*3/uL (ref 0.0–0.1)
Basophils Relative: 0.7 % (ref 0.0–3.0)
EOS PCT: 3.4 % (ref 0.0–5.0)
Eosinophils Absolute: 0.2 10*3/uL (ref 0.0–0.7)
HCT: 39.9 % (ref 36.0–46.0)
Hemoglobin: 13.3 g/dL (ref 12.0–15.0)
LYMPHS ABS: 1.4 10*3/uL (ref 0.7–4.0)
Lymphocytes Relative: 25.7 % (ref 12.0–46.0)
MCHC: 33.3 g/dL (ref 30.0–36.0)
MCV: 84.8 fl (ref 78.0–100.0)
MONOS PCT: 12 % (ref 3.0–12.0)
Monocytes Absolute: 0.6 10*3/uL (ref 0.1–1.0)
NEUTROS ABS: 3.1 10*3/uL (ref 1.4–7.7)
Neutrophils Relative %: 58.2 % (ref 43.0–77.0)
Platelets: 337 10*3/uL (ref 150.0–400.0)
RBC: 4.7 Mil/uL (ref 3.87–5.11)
RDW: 15.1 % (ref 11.5–15.5)
WBC: 5.3 10*3/uL (ref 4.0–10.5)

## 2014-10-16 LAB — COMPREHENSIVE METABOLIC PANEL
ALK PHOS: 72 U/L (ref 39–117)
ALT: 12 U/L (ref 0–35)
AST: 15 U/L (ref 0–37)
Albumin: 4.2 g/dL (ref 3.5–5.2)
BILIRUBIN TOTAL: 0.4 mg/dL (ref 0.2–1.2)
BUN: 11 mg/dL (ref 6–23)
CO2: 27 mEq/L (ref 19–32)
Calcium: 9.4 mg/dL (ref 8.4–10.5)
Chloride: 103 mEq/L (ref 96–112)
Creatinine, Ser: 0.63 mg/dL (ref 0.40–1.20)
GFR: 107.5 mL/min (ref >60.00)
Glucose, Bld: 84 mg/dL (ref 70–99)
POTASSIUM: 3.8 meq/L (ref 3.5–5.1)
SODIUM: 136 meq/L (ref 135–145)
Total Protein: 7.7 g/dL (ref 6.0–8.3)

## 2014-10-16 LAB — T3: T3 TOTAL: 128.8 ng/dL (ref 80.0–204.0)

## 2014-10-16 LAB — TSH: TSH: 0.84 u[IU]/mL (ref 0.35–4.50)

## 2014-10-16 LAB — T4: T4 TOTAL: 6.4 ug/dL (ref 4.5–12.0)

## 2014-10-16 MED ORDER — ADALIMUMAB 40 MG/0.8ML ~~LOC~~ AJKT: 1.0000 | AUTO-INJECTOR | SUBCUTANEOUS | Status: DC

## 2014-10-16 NOTE — Assessment & Plan Note (Signed)
Patient remains in clinical remission on Humira alone.  Several months ago she had a flareup following a prolonged antibody course for sinusitis.  Recommendations #1 continue Humira #2 check CBC, comprehensive metabolic profile, T3, T4, TSH, QuantiFERON gold #3 colonoscopy in February, 2017

## 2014-10-16 NOTE — Addendum Note (Signed)
Addended by: Oda Kilts on: 10/16/2014 02:45 PM   Modules accepted: Orders

## 2014-10-16 NOTE — Progress Notes (Signed)
      History of Present Illness:  Ms. Hollander has left-sided colitis greater than 15 years and is maintained on Humira.  She had a flareup over the winter following a prolonged antibiotically course for sinusitis.  This required prednisone that eventually was tapered.  She's been off prednisone for several months and is back to baseline.  She feels well without abdominal pain, bleeding or diarrhea.  She still complains of some fatigue.    Review of Systems: Pertinent positive and negative review of systems were noted in the above HPI section. All other review of systems were otherwise negative.    Current Medications, Allergies, Past Medical History, Past Surgical History, Family History and Social History were reviewed in [facility] record  Vital signs were reviewed in today's medical record. Physical Exam: General: Well developed , well nourished, no acute distress Skin: anicteric Head: Normocephalic and atraumatic Eyes:  sclerae anicteric, EOMI Ears: Normal auditory acuity Mouth: No deformity or lesions Lymph Nodes: no lymphadenopathy Lungs: Clear throughout to auscultation Heart: Regular rate and rhythm; no murmurs, rubs or brui: Gastroinestinal:  Soft, non tender and non distended. No masses, hepatosplenomegaly or hernias noted. Normal Bowel sounds Rectal:deferred Musculoskeletal: Symmetrical with no gross deformities  Pulses:  Normal pulses noted Extremities: No clubbing, cyanosis, edema or deformities noted Neurological: Alert oriented x 4, grossly nonfocal Psychological:  Alert and cooperative. Normal mood and affect  See Assessment and Plan under Problem List

## 2014-10-16 NOTE — Patient Instructions (Signed)
Go to the basement today for labs We will renew your Bernette Mayers Your colonoscopy recall is in 2/ 2017

## 2014-10-22 ENCOUNTER — Telehealth: Payer: Self-pay | Admitting: Gastroenterology

## 2014-10-22 MED ORDER — ADALIMUMAB 40 MG/0.8ML ~~LOC~~ AJKT: 1.0000 | AUTO-INJECTOR | SUBCUTANEOUS | Status: DC

## 2014-10-22 NOTE — Telephone Encounter (Signed)
[facility] sent [facility]

## 2014-11-06 ENCOUNTER — Ambulatory Visit: Payer: BLUE CROSS/BLUE SHIELD

## 2014-11-09 ENCOUNTER — Encounter: Payer: Self-pay | Admitting: Gastroenterology

## 2014-11-13 ENCOUNTER — Other Ambulatory Visit: Payer: Self-pay

## 2014-11-26 ENCOUNTER — Other Ambulatory Visit: Payer: Self-pay

## 2014-11-26 ENCOUNTER — Telehealth: Payer: Self-pay | Admitting: Gastroenterology

## 2014-11-26 NOTE — Telephone Encounter (Signed)
Patient instructed.

## 2014-11-26 NOTE — Telephone Encounter (Signed)
Begin prednisone 20 mg daily.  Call back on Thursday.

## 2014-11-26 NOTE — Telephone Encounter (Signed)
She had a virus about 2 weeks ago. Recovered from it. But her bowel movements became increasing frequent, now with blood in them and very loose. She has the joint aches which she says is one of her symptoms with a flare. She feels it is time for some prednisone. Please advise.

## 2014-11-30 NOTE — Telephone Encounter (Signed)
Decrease to 15 mg prednisone daily for 5 days, then 10 mg daily for 5 days, then 5 mg every other day for 5 days, then 5 mg daily for 5 days, then discontinue.  She should call if diarrhea recurs after she decreases a prednisone dose

## 2014-11-30 NOTE — Telephone Encounter (Signed)
She has improved. Less arthralgia now. Bowel movement normal today. No hematochezia. She is on Prednisone 20 mg for 5 days now. Please advise.

## 2014-11-30 NOTE — Telephone Encounter (Signed)
Patient instructed.

## 2014-12-13 ENCOUNTER — Ambulatory Visit
Admission: RE | Admit: 2014-12-13 | Discharge: 2014-12-13 | Disposition: A | Discharge status: Home / Self Care | Payer: 59 | Source: Ambulatory Visit

## 2014-12-13 DIAGNOSIS — Z1231 Encounter for screening mammogram for malignant neoplasm of breast: Secondary | ICD-10-CM

## 2015-04-26 ENCOUNTER — Encounter: Payer: Self-pay | Admitting: Gastroenterology

## 2015-05-01 ENCOUNTER — Ambulatory Visit (INDEPENDENT_AMBULATORY_CARE_PROVIDER_SITE_OTHER): Payer: 59 | Admitting: Family Medicine

## 2015-05-01 ENCOUNTER — Encounter: Payer: Self-pay | Admitting: Family Medicine

## 2015-05-01 VITALS — BP 102/64 | HR 54 | Temp 98.6°F | Resp 14 | Ht 64.0 in | Wt 140.0 lb

## 2015-05-01 DIAGNOSIS — Z113 Encounter for screening for infections with a predominantly sexual mode of transmission: Secondary | ICD-10-CM | POA: Diagnosis not present

## 2015-05-01 DIAGNOSIS — S3992XA Unspecified injury of lower back, initial encounter: Secondary | ICD-10-CM | POA: Diagnosis not present

## 2015-05-01 MED ORDER — TRAMADOL HCL 50 MG PO TABS: 50.0000 mg | ORAL_TABLET | Freq: Three times a day (TID) | ORAL | Status: DC | PRN

## 2015-05-01 MED FILL — traMADol HCL 50 MG TABS: 50 | 10 days supply | Qty: 30 | Fill #0

## 2015-05-01 NOTE — Progress Notes (Signed)
Patient ID: [name], female   DOB: November 30, 1965, 50 y.o.   MRN: [id number]   Subjective:    Patient ID: [name], female    DOB: 11-Feb-1966, 50 y.o.   MRN: [id number]  Patient presents for Lower Back Pain  Pt here with tailbone pain for the past 2 months. She does have memory particular injury but she is having her sit on a special pillow because sitting longer to time cause significant pain. He is not having any pain in her hip region her back no sciatica symptoms she occasionally takes an anti-inflammatory but has to be careful with this because of her ulcerative colitis Tylenol does not help.   She would also like to have a hepatitis screen done. She overheard her children stating that the father may have hepatitis she states that they are now divorced for the past 7 years however he had multiple affairs in this arrest her mind to have her checks. She has been checked by her GYN over the years for STDs but does not remember hepatitis being done.    note she did have a GYN exam done so they could check her rectal area in the vaginal area there was no abnormalities to suggest her pain Review Of Systems:  GEN- denies fatigue, fever, weight loss,weakness, recent illness HEENT- denies eye drainage, change in vision, nasal discharge, CVS- denies chest pain, palpitations RESP- denies SOB, cough, wheeze ABD- denies N/V, change in stools, abd pain GU- denies dysuria, hematuria, dribbling, incontinence MSK- + joint pain, muscle aches, injury Neuro- denies headache, dizziness, syncope, seizure activity       Objective:    BP 102/64 mmHg  Pulse 54  Temp(Src) 98.6 F (37 C) (Oral)  Resp 14  Ht 5' 4"  (1.626 m)  Wt 140 lb (63.504 kg)  BMI 24.02 kg/m2 GEN- NAD, alert and oriented x3 MSK- FROM spine, Hips, Knees, TTP at tip of coccyx, no abnormal movement,  No SI joint tenderness, Spine NT , neg SLR  Normal gait, uncomfortable sitting in chair         Assessment & Plan:       Problem List Items Addressed This Visit    None    Visit Diagnoses    Screen for STD (sexually transmitted disease)    -  Primary    Relevant Orders    Hepatitis panel, acute    Tailbone injury, initial encounter        unknown cause, with no injury, obtain xray of region, Ultram given for pain, no NSAIDS with UC    Relevant Orders    DG Sacrum/Coccyx       Note: This dictation was prepared with Dragon dictation along with smaller phrase technology. Any transcriptional errors that result from this process are unintentional.

## 2015-05-01 NOTE — Patient Instructions (Signed)
Get the xray done  We will call with labs F/U as needed

## 2015-05-02 LAB — HEPATITIS PANEL, ACUTE
HCV AB: NEGATIVE
HEP B C IGM: NONREACTIVE
Hep A IgM: NONREACTIVE
Hepatitis B Surface Ag: NEGATIVE

## 2015-05-06 ENCOUNTER — Telehealth: Payer: Self-pay | Admitting: Family Medicine

## 2015-05-06 NOTE — Telephone Encounter (Signed)
Patient calling to ask question about the xray on her back that there is an order for  [phone]

## 2015-05-08 ENCOUNTER — Ambulatory Visit
Admission: RE | Admit: 2015-05-08 | Discharge: 2015-05-08 | Disposition: A | Discharge status: Home / Self Care | Payer: 59 | Source: Ambulatory Visit | Attending: Family Medicine | Admitting: Family Medicine

## 2015-05-08 DIAGNOSIS — M533 Sacrococcygeal disorders, not elsewhere classified: Secondary | ICD-10-CM | POA: Diagnosis not present

## 2015-05-08 DIAGNOSIS — S3992XA Unspecified injury of lower back, initial encounter: Secondary | ICD-10-CM

## 2015-05-08 NOTE — Telephone Encounter (Signed)
lmtrc

## 2015-05-08 NOTE — Telephone Encounter (Signed)
Pt called back and inquired about her xray and wanted to know when she can go have it done, I informed pt that she could go as [facility] and get done. Pt stated she will try to go today to have done

## 2015-05-15 ENCOUNTER — Telehealth: Payer: Self-pay | Admitting: Family Medicine

## 2015-05-15 DIAGNOSIS — M47818 Spondylosis without myelopathy or radiculopathy, sacral and sacrococcygeal region: Secondary | ICD-10-CM

## 2015-05-15 NOTE — Telephone Encounter (Signed)
?  ok to send referral to have injections?

## 2015-05-15 NOTE — Telephone Encounter (Signed)
Okay to send referral- Send to Dr. Nelva Bush SI joint Arthritis-

## 2015-05-15 NOTE — Telephone Encounter (Signed)
Pt called and would like a referral to have a Cortizone injection as previously discussed with Dr. Buelah Manis. She has been tying back exercises but she is in too much pain to do them.   [phone]

## 2015-05-16 NOTE — Telephone Encounter (Signed)
Referral placed to Dr .Nelva Bush

## 2015-05-23 ENCOUNTER — Telehealth: Payer: Self-pay | Admitting: Gastroenterology

## 2015-05-24 NOTE — Telephone Encounter (Signed)
Left message for patient to return my call to schedule OV with Dr. Hilarie Fredrickson.

## 2015-05-24 NOTE — Telephone Encounter (Signed)
ok 

## 2015-05-30 DIAGNOSIS — M533 Sacrococcygeal disorders, not elsewhere classified: Secondary | ICD-10-CM | POA: Diagnosis not present

## 2015-07-01 MED FILL — LARIN FE 1-20 TABLET: 1-20 | 84 days supply | Qty: 84 | Fill #0

## 2015-07-02 ENCOUNTER — Telehealth: Payer: Self-pay | Admitting: Internal Medicine

## 2015-07-02 MED ORDER — ADALIMUMAB 40 MG/0.8ML ~~LOC~~ AJKT: 1.0000 | AUTO-INJECTOR | SUBCUTANEOUS | Status: DC

## 2015-07-02 MED FILL — HUMIRA PEN 40 MG/0.8ML PNKT: 40 | 28 days supply | Qty: 2 | Fill #0

## 2015-07-02 MED FILL — FINACEA 15% FOAM: 15 | 30 days supply | Qty: 50 | Fill #1

## 2015-07-02 NOTE — Telephone Encounter (Signed)
Script sent to pharmacy.

## 2015-07-17 ENCOUNTER — Other Ambulatory Visit (INDEPENDENT_AMBULATORY_CARE_PROVIDER_SITE_OTHER): Payer: 59

## 2015-07-17 ENCOUNTER — Encounter: Payer: Self-pay | Admitting: Internal Medicine

## 2015-07-17 ENCOUNTER — Ambulatory Visit (INDEPENDENT_AMBULATORY_CARE_PROVIDER_SITE_OTHER): Payer: 59 | Admitting: Internal Medicine

## 2015-07-17 VITALS — BP 94/60 | HR 66 | Ht 64.0 in | Wt 145.0 lb

## 2015-07-17 DIAGNOSIS — K529 Noninfective gastroenteritis and colitis, unspecified: Secondary | ICD-10-CM | POA: Diagnosis not present

## 2015-07-17 DIAGNOSIS — K5190 Ulcerative colitis, unspecified, without complications: Secondary | ICD-10-CM | POA: Diagnosis not present

## 2015-07-17 DIAGNOSIS — K51211 Ulcerative (chronic) proctitis with rectal bleeding: Secondary | ICD-10-CM

## 2015-07-17 DIAGNOSIS — M129 Arthropathy, unspecified: Secondary | ICD-10-CM

## 2015-07-17 LAB — CBC WITH DIFFERENTIAL/PLATELET
BASOS ABS: 0 10*3/uL (ref 0.0–0.1)
Basophils Relative: 0.6 % (ref 0.0–3.0)
Eosinophils Absolute: 0.2 10*3/uL (ref 0.0–0.7)
Eosinophils Relative: 2.5 % (ref 0.0–5.0)
HCT: 38.8 % (ref 36.0–46.0)
Hemoglobin: 13.1 g/dL (ref 12.0–15.0)
LYMPHS ABS: 1.2 10*3/uL (ref 0.7–4.0)
Lymphocytes Relative: 17.8 % (ref 12.0–46.0)
MCHC: 33.7 g/dL (ref 30.0–36.0)
MCV: 85.8 fl (ref 78.0–100.0)
MONO ABS: 0.5 10*3/uL (ref 0.1–1.0)
Monocytes Relative: 7.5 % (ref 3.0–12.0)
NEUTROS PCT: 71.6 % (ref 43.0–77.0)
Neutro Abs: 4.7 10*3/uL (ref 1.4–7.7)
Platelets: 340 10*3/uL (ref 150.0–400.0)
RBC: 4.53 Mil/uL (ref 3.87–5.11)
RDW: 14.2 % (ref 11.5–15.5)
WBC: 6.6 10*3/uL (ref 4.0–10.5)

## 2015-07-17 LAB — COMPREHENSIVE METABOLIC PANEL
ALK PHOS: 71 U/L (ref 39–117)
ALT: 8 U/L (ref 0–35)
AST: 14 U/L (ref 0–37)
Albumin: 4.3 g/dL (ref 3.5–5.2)
BILIRUBIN TOTAL: 0.5 mg/dL (ref 0.2–1.2)
BUN: 13 mg/dL (ref 6–23)
CO2: 26 mEq/L (ref 19–32)
Calcium: 9.6 mg/dL (ref 8.4–10.5)
Chloride: 103 mEq/L (ref 96–112)
Creatinine, Ser: 0.62 mg/dL (ref 0.40–1.20)
GFR: 109.15 mL/min (ref >60.00)
Glucose, Bld: 79 mg/dL (ref 70–99)
Potassium: 4.1 mEq/L (ref 3.5–5.1)
SODIUM: 136 meq/L (ref 135–145)
TOTAL PROTEIN: 8.1 g/dL (ref 6.0–8.3)

## 2015-07-17 LAB — HIGH SENSITIVITY CRP: CRP HIGH SENSITIVITY: 9.58 mg/L — AB (ref 0.000–5.000)

## 2015-07-17 MED ORDER — MESALAMINE 1.2 G PO TBEC: 4.8000 g | DELAYED_RELEASE_TABLET | Freq: Every day | ORAL | Status: DC

## 2015-07-17 MED ORDER — NA SULFATE-K SULFATE-MG SULF 17.5-3.13-1.6 GM/177ML PO SOLN: ORAL | Status: DC

## 2015-07-17 MED FILL — LIALDA 1.2 GM TABLET SA: 1.2 | 30 days supply | Qty: 120 | Fill #0

## 2015-07-17 NOTE — Progress Notes (Signed)
Patient ID: [name], female   DOB: 06/03/1965, 50 y.o.   MRN: [id number] HPI: [name] is a 50 year old female with chronic long-standing left-sided ulcerative colitis previously followed by Dr. Deatra Ina presents for follow-up. This is her first visit with me and she was last seen in the office in August 2016. She reports being diagnosed with left-sided colitis over 20 years ago when she was in her early 10s. She has been treated in the past with Lialda, 6-MP and currently with Humira 40 mg every 14 days. She states she has not had a "major" flare in over 2 years. For her major flare would be described as requiring steroids and having bowel movements 20+ times a day. She does feel like she is currently in a "mini flare". She is having joint pains in her hands, knees and feet. Joint pains have slowly worsened over the years. She is currently having 6-7 bowel movements a day which are described as bloody diarrhea. She has lower abdominal cramping but denies pain. She gets intermittent oral ulcers. She denies fevers, chills or night sweats. No rashes or eye pain. Last Humira dose was 14 days ago when she is due today. She is not sure why she is no longer on a mesalamine product. Weight and appetite stable. Denies hepatobiliary complaint. She works as a Marine scientist typically in ICU. She was recently exposed to a patient with miliary TB. She states she had direct contact with this patient. She has been following through with employee health.  Her last colonoscopy was performed by Dr. Deatra Ina on 05/12/2012. This showed moderately active left-sided colitis. Biopsies showed chronic mildly active colitis without dysplasia.  Past Medical History  Diagnosis Date  . Ulcerative colitis   . DDD (degenerative disc disease), lumbar     S-I joints    Past Surgical History  Procedure Laterality Date  . Tonsillectomy    . Abdominal hernia repair      age 74  . Colonoscopy  05/12/2012    multiple     Outpatient  Prescriptions Prior to Visit  Medication Sig Dispense Refill  . Adalimumab (HUMIRA PEN) 40 MG/0.8ML PNKT Inject 1 Syringe into the skin every 14 (fourteen) days. 2 each PRN  . Cholecalciferol (VITAMIN D3) 2000 UNITS TABS Take 1 tablet by mouth daily.    . citalopram (CELEXA) 10 MG tablet Take 10 mg by mouth daily.    . Multiple Vitamin (MULTIVITAMIN) tablet Take 1 tablet by mouth daily.    . traMADol (ULTRAM) 50 MG tablet Take 1 tablet (50 mg total) by mouth every 8 (eight) hours as needed. 30 tablet 0   No facility-administered medications prior to visit.    No Known Allergies  Family History  Problem Relation Age of Onset  . Breast cancer Mother   . Hyperlipidemia Mother   . Glaucoma Mother   . Macular degeneration Father   . Lung cancer Father 38  . Colon cancer Neg Hx     Social History  Substance Use Topics  . Smoking status: Never Smoker   . Smokeless tobacco: Never Used  . Alcohol Use: No    ROS: As per history of present illness, otherwise negative  BP 94/60 mmHg  Pulse 66  Ht 5' 4"  (1.626 m)  Wt 145 lb (65.772 kg)  BMI 24.88 kg/m2 Constitutional: Well-developed and well-nourished. No distress. HEENT: Normocephalic and atraumatic. Oropharynx is clear and moist. No oropharyngeal exudate. Conjunctivae are normal.  No scleral icterus. Neck: Neck supple. Trachea  midline. Cardiovascular: Normal rate, regular rhythm and intact distal pulses. No M/R/G Pulmonary/chest: Effort normal and breath sounds normal. No wheezing, rales or rhonchi. Abdominal: Soft, Mid and lower abdominal tenderness without rebound or guarding, nondistended. Bowel sounds active throughout. There are no masses palpable. No hepatosplenomegaly. Extremities: no clubbing, cyanosis, or edema Lymphadenopathy: No cervical adenopathy noted. Neurological: Alert and oriented to person place and time. Skin: Skin is warm and dry. No rashes noted. Psychiatric: Normal mood and affect. Behavior is  normal.  RELEVANT LABS AND IMAGING: CBC    Component Value Date/Time   WBC 5.3 10/16/2014 1430   RBC 4.70 10/16/2014 1430   HGB 13.3 10/16/2014 1430   HCT 39.9 10/16/2014 1430   PLT 337.0 10/16/2014 1430   MCV 84.8 10/16/2014 1430   MCH 29.0 06/07/2014 1221   MCHC 33.3 10/16/2014 1430   RDW 15.1 10/16/2014 1430   LYMPHSABS 1.4 10/16/2014 1430   MONOABS 0.6 10/16/2014 1430   EOSABS 0.2 10/16/2014 1430   BASOSABS 0.0 10/16/2014 1430    CMP     Component Value Date/Time   NA 136 10/16/2014 1430   K 3.8 10/16/2014 1430   CL 103 10/16/2014 1430   CO2 27 10/16/2014 1430   GLUCOSE 84 10/16/2014 1430   BUN 11 10/16/2014 1430   CREATININE 0.63 10/16/2014 1430   CREATININE 0.58 06/07/2014 1222   CALCIUM 9.4 10/16/2014 1430   PROT 7.7 10/16/2014 1430   ALBUMIN 4.2 10/16/2014 1430   AST 15 10/16/2014 1430   ALT 12 10/16/2014 1430   ALKPHOS 72 10/16/2014 1430   BILITOT 0.4 10/16/2014 1430   GFRNONAA >89 06/07/2014 1222   GFRNONAA 97.64 03/27/2009 1254   GFRAA >89 06/07/2014 1222    ASSESSMENT/PLAN: 50 year old female with chronic long-standing left-sided ulcerative colitis previously followed by Dr. Deatra Ina presents for follow-up.  1. Chronic left-sided ulcerative colitis, greater than 20 years duration -- she has symptoms of active inflammation despite Humira 40 mg every 14 days. I recommended checking an Anser ADA to evaluate drug level and rule out antibodies. We may need to increase dose frequency or change altogether should antibodies be discovered. Given active colitis symptoms and going to add Lialda 4.8 g daily. Check C. difficile PCR. CBC, CMP, CRP and quantiferon gold. I recommended surveillance colonoscopy and we discussed guidelines which support surveillance colonoscopy every 1-2 years for 4 quadrant biopsies given long-standing nature of her disease. After discussion of the risks, benefits and alternative she is agreeable to proceed with colonoscopy. If no improvement  in current flare symptoms with Lialda consider Uceris. With C. difficile testing, check fecal calprotectin  6 month follow-up, sooner if necessary 25 minutes spent with the patient today. Greater than 50% was spent in counseling and coordination of care with the patient    RA:[id number] F Blaine, Md [address] [place], [place] [address]

## 2015-07-17 NOTE — Patient Instructions (Addendum)
You have been scheduled for a colonoscopy. Please follow written instructions given to you at your visit today.  Please pick up your prep supplies at the pharmacy within the next 1-3 days. If you use inhalers (even only as needed), please bring them with you on the day of your procedure. Your physician has requested that you go to www.startemmi.com and enter the access code given to you at your visit today. This web site gives a general overview about your procedure. However, you should still follow specific instructions given to you by our office regarding your preparation for the procedure.  Your physician has requested that you go to the basement for the following lab work before leaving today: Quantiferon gold, CRP, CMP, CBC, C diff, Fecal calprotectin  We have given you orders to have an Anser ADA test completed a [facility] lab drawing station. They are located at [address]. They are open on Monday-Friday, 7 am-6 pm.  We have sent the following medications to your pharmacy for you to pick up at your convenience: Lialda 4.8 grams daily.  Continue Humira every 2 weeks.  If you do not get better within 2 weeks after starting Lialda, please call our office at [phone] to let us know.  If you are age 25 or older, your body mass index should be between 23-30. Your Body mass index is 24.88 kg/(m^2). If this is out of the aforementioned range listed, please consider follow up with your Primary Care Provider.  If you are age 30 or younger, your body mass index should be between 19-25. Your Body mass index is 24.88 kg/(m^2). If this is out of the aformentioned range listed, please consider follow up with your Primary Care Provider.

## 2015-07-18 ENCOUNTER — Other Ambulatory Visit: Payer: 59

## 2015-07-18 ENCOUNTER — Other Ambulatory Visit: Payer: Self-pay | Admitting: Internal Medicine

## 2015-07-18 DIAGNOSIS — K51211 Ulcerative (chronic) proctitis with rectal bleeding: Secondary | ICD-10-CM | POA: Diagnosis not present

## 2015-07-18 DIAGNOSIS — K5190 Ulcerative colitis, unspecified, without complications: Secondary | ICD-10-CM | POA: Diagnosis not present

## 2015-07-18 DIAGNOSIS — K625 Hemorrhage of anus and rectum: Secondary | ICD-10-CM | POA: Diagnosis not present

## 2015-07-18 MED FILL — SUPREP BOWEL PREP KIT: 17.5-3.13-1 | 1 days supply | Qty: 354 | Fill #0

## 2015-07-19 LAB — QUANTIFERON TB GOLD ASSAY (BLOOD)
Interferon Gamma Release Assay: NEGATIVE
Mitogen-Nil: 2.15 IU/mL
QUANTIFERON NIL VALUE: 0.02 [IU]/mL
Quantiferon Tb Ag Minus Nil Value: 0 IU/mL

## 2015-07-19 LAB — PROMETHEUS-MAIL

## 2015-07-20 LAB — CLOSTRIDIUM DIFFICILE BY PCR: CDIFFPCR: NOT DETECTED

## 2015-07-24 LAB — CALPROTECTIN, FECAL: Calprotectin, Fecal: 1005 ug/g — ABNORMAL HIGH (ref 0–120)

## 2015-07-30 ENCOUNTER — Telehealth: Payer: Self-pay

## 2015-07-30 ENCOUNTER — Other Ambulatory Visit: Payer: Self-pay

## 2015-07-30 MED ORDER — GOLIMUMAB 100 MG/ML ~~LOC~~ SOSY: 100.0000 mg | PREFILLED_SYRINGE | SUBCUTANEOUS | Status: DC

## 2015-07-30 MED ORDER — GOLIMUMAB 100 MG/ML ~~LOC~~ SOAJ: SUBCUTANEOUS | Status: DC

## 2015-07-30 MED FILL — SIMPONI 100 MG/ML SOAJ: 100 | 28 days supply | Qty: 3 | Fill #0

## 2015-07-30 NOTE — Telephone Encounter (Signed)
Pt has developed antibody to Humira per Dr. Hilarie Fredrickson. Pt needs to start on Simponi. Pt aware, copay assistance card mailed to pt. Script sent to pharmacy.

## 2015-08-06 DIAGNOSIS — H5203 Hypermetropia, bilateral: Secondary | ICD-10-CM | POA: Diagnosis not present

## 2015-08-06 DIAGNOSIS — H524 Presbyopia: Secondary | ICD-10-CM | POA: Diagnosis not present

## 2015-08-06 DIAGNOSIS — H52223 Regular astigmatism, bilateral: Secondary | ICD-10-CM | POA: Diagnosis not present

## 2015-08-08 ENCOUNTER — Encounter: Payer: Self-pay | Admitting: Internal Medicine

## 2015-08-15 MED FILL — LIALDA 1.2 GM TABLET SA: 1.2 | 30 days supply | Qty: 120 | Fill #1

## 2015-08-28 ENCOUNTER — Ambulatory Visit (AMBULATORY_SURGERY_CENTER): Payer: 59 | Admitting: Internal Medicine

## 2015-08-28 ENCOUNTER — Encounter: Payer: Self-pay | Admitting: Internal Medicine

## 2015-08-28 VITALS — BP 112/73 | HR 57 | Temp 99.5°F | Resp 12 | Ht 64.0 in | Wt 145.0 lb

## 2015-08-28 DIAGNOSIS — Z1211 Encounter for screening for malignant neoplasm of colon: Secondary | ICD-10-CM | POA: Diagnosis present

## 2015-08-28 DIAGNOSIS — K51211 Ulcerative (chronic) proctitis with rectal bleeding: Secondary | ICD-10-CM | POA: Diagnosis not present

## 2015-08-28 DIAGNOSIS — K5150 Left sided colitis without complications: Secondary | ICD-10-CM | POA: Diagnosis not present

## 2015-08-28 DIAGNOSIS — K529 Noninfective gastroenteritis and colitis, unspecified: Secondary | ICD-10-CM | POA: Diagnosis not present

## 2015-08-28 MED ORDER — SODIUM CHLORIDE 0.9 % IV SOLN: 500.0000 mL | INTRAVENOUS | Status: DC

## 2015-08-28 NOTE — Op Note (Signed)
[facility] Patient Name: [name] Procedure Date: 08/28/2015 2:09 PM MRN: [id number] Endoscopist: Jerene Bears , MD Age: 50 Referring MD:  Date of Birth: 1965-11-18 Gender: Female Procedure:                Colonoscopy Indications:              High risk colon cancer surveillance: Ulcerative                            left sided colitis of 15 (or more) years duration,                            Last colonoscopy: February 2014 Medicines:                Monitored Anesthesia Care Procedure:                Pre-Anesthesia Assessment:                           - Prior to the procedure, a History and Physical                            was performed, and patient medications and                            allergies were reviewed. The patient's tolerance of                            previous anesthesia was also reviewed. The risks                            and benefits of the procedure and the sedation                            options and risks were discussed with the patient.                            All questions were answered, and informed consent                            was obtained. Prior Anticoagulants: The patient has                            taken no previous anticoagulant or antiplatelet                            agents. ASA Grade Assessment: II - A patient with                            mild systemic disease. After reviewing the risks                            and benefits, the patient was deemed in  satisfactory condition to undergo the procedure.                           After obtaining informed consent, the colonoscope                            was passed under direct vision. Throughout the                            procedure, the patient's blood pressure, pulse, and                            oxygen saturations were monitored continuously. The                            Model PCF-[id number]) scope was introduced                          through the anus and advanced to the the terminal                            ileum. The colonoscopy was performed without                            difficulty. The patient tolerated the procedure                            well. The quality of the bowel preparation was fair                            clearing to good with copious irrigation and                            lavage. The terminal ileum, ileocecal valve,                            appendiceal orifice, and rectum were photographed.                            The bowel preparation used was SUPREP. Scope In: 2:21:05 PM Scope Out: 2:36:50 PM Scope Withdrawal Time: 0 hours 13 minutes 6 seconds  Total Procedure Duration: 0 hours 15 minutes 45 seconds  Findings:                 The digital rectal exam was normal.                           The terminal ileum appeared normal.                           Inflammation characterized by erythema, friability,                            granularity and loss of vascularity was found in a  continuous and circumferential pattern from the                            rectum to the descending colon. The area from cecum                            to splenic flexure was spared. This was graded as                            Mayo Score 1 (mild, with erythema, decreased                            vascular pattern, mild friability). Four biopsies                            were taken every 10 cm with a cold forceps from the                            entire colon for ulcerative colitis surveillance.                            These biopsy specimens from the right colon, left                            colon and rectum were sent to Pathology.                           Retroflexion in the rectum was not performed due to                            anatomy. Complications:            No immediate complications. Estimated Blood Loss:     Estimated blood loss was  minimal. Impression:               - The examined portion of the ileum was normal.                           - Left-sided ulcerative colitis. Inflammation was                            found from the rectum to the descending colon. This                            was graded as Mayo Score 1 (mild disease). Biopsied.                           - Normal colonic mucosa from the cecum to the                            splenic flexure. Recommendation:           - Patient has a contact number available for  emergencies. The signs and symptoms of potential                            delayed complications were discussed with the                            patient. Return to normal activities tomorrow.                            Written discharge instructions were provided to the                            patient.                           - Resume previous diet.                           - Continue present medications, including Lialda                            4.8g daily and Symponi.                           - Await pathology results.                           - Repeat colonoscopy in 2 years for surveillance.                           - Return to GI clinic in 4 months. Jerene Bears, MD 08/28/2015 2:45:27 PM This report has been signed electronically.

## 2015-08-28 NOTE — Patient Instructions (Addendum)
Resume your medications.  Follow up with Dr. Hilarie Fredrickson in his office in 4 months.   YOU HAD AN ENDOSCOPIC PROCEDURE TODAY AT [facility]:   Refer to the procedure report that was given to you for any specific questions about what was found during the examination.  If the procedure report does not answer your questions, please call your gastroenterologist to clarify.  If you requested that your care partner not be given the details of your procedure findings, then the procedure report has been included in a sealed envelope for you to review at your convenience later.  YOU SHOULD EXPECT: Some feelings of bloating in the abdomen. Passage of more gas than usual.  Walking can help get rid of the air that was put into your GI tract during the procedure and reduce the bloating. If you had a lower endoscopy (such as a colonoscopy or flexible sigmoidoscopy) you may notice spotting of blood in your stool or on the toilet paper. If you underwent a bowel prep for your procedure, you may not have a normal bowel movement for a few days.  Please Note:  You might notice some irritation and congestion in your nose or some drainage.  This is from the oxygen used during your procedure.  There is no need for concern and it should clear up in a day or so.  SYMPTOMS TO REPORT IMMEDIATELY:   Following lower endoscopy (colonoscopy or flexible sigmoidoscopy):  Excessive amounts of blood in the stool  Significant tenderness or worsening of abdominal pains  Swelling of the abdomen that is new, acute  Fever of 100F or higher   For urgent or emergent issues, a gastroenterologist can be reached at any hour by calling [phone].   DIET: Your first meal following the procedure should be a small meal and then it is ok to progress to your normal diet. Heavy or fried foods are harder to digest and may make you feel nauseous or bloated.  Likewise, meals heavy in dairy and vegetables can increase bloating.   Drink plenty of fluids but you should avoid alcoholic beverages for 24 hours.  ACTIVITY:  You should plan to take it easy for the rest of today and you should NOT DRIVE or use heavy machinery until tomorrow (because of the sedation medicines used during the test).    FOLLOW UP: Our staff will call the number listed on your records the next business day following your procedure to check on you and address any questions or concerns that you may have regarding the information given to you following your procedure. If we do not reach you, we will leave a message.  However, if you are feeling well and you are not experiencing any problems, there is no need to return our call.  We will assume that you have returned to your regular daily activities without incident.  If any biopsies were taken you will be contacted by phone or by letter within the next 1-3 weeks.  Please call us at [phone] if you have not heard about the biopsies in 3 weeks.    SIGNATURES/CONFIDENTIALITY: You and/or your care partner have signed paperwork which will be entered into your electronic medical record.  These signatures attest to the fact that that the information above on your After Visit Summary has been reviewed and is understood.  Full responsibility of the confidentiality of this discharge information lies with you and/or your care-partner.

## 2015-08-28 NOTE — Progress Notes (Signed)
Called to room to assist during endoscopic procedure.  Patient ID and intended procedure confirmed with present staff. Received instructions for my participation in the procedure from the performing physician.  

## 2015-08-28 NOTE — Progress Notes (Signed)
  [facility] Post-op Note  Patient: [name]  Procedure(s) Performed: colonoscopy  Patient Location: [facility] - Recovery Area  Anesthesia Type: Deep Sedation/Propofol  Level of Consciousness: awake, oriented and patient cooperative  Airway and Oxygen Therapy: Patient Spontanous Breathing  Post-op Pain: none  Post-op Assessment:  Post-op Vital signs reviewed, Patient's Cardiovascular Status Stable, Respiratory Function Stable, Patent Airway, No signs of Nausea or vomiting and Pain level controlled  Post-op Vital Signs: Reviewed and stable  Complications: No apparent anesthesia complications  Jian Hodgman E 2:42 PM

## 2015-08-29 ENCOUNTER — Telehealth: Payer: Self-pay | Admitting: *Deleted

## 2015-08-29 NOTE — Telephone Encounter (Signed)
No answer. Number identifier. Message left to call if questions or concerns.

## 2015-09-03 ENCOUNTER — Encounter: Payer: Self-pay | Admitting: Internal Medicine

## 2015-09-06 MED FILL — SIMPONI 100 MG/ML SOAJ: 100 | 28 days supply | Qty: 1 | Fill #0

## 2015-09-16 MED FILL — LIALDA 1.2 GM TABLET SA: 1.2 | 30 days supply | Qty: 120 | Fill #2

## 2015-09-19 ENCOUNTER — Telehealth: Payer: Self-pay | Admitting: Pharmacist

## 2015-09-19 NOTE — Telephone Encounter (Signed)
Called patient to schedule appointment for the [facility] but I was unable to reach her. Left a HIPAA-compliant message requesting that she return my call.

## 2015-09-24 ENCOUNTER — Ambulatory Visit (HOSPITAL_BASED_OUTPATIENT_CLINIC_OR_DEPARTMENT_OTHER): Payer: 59 | Admitting: Pharmacist

## 2015-09-24 DIAGNOSIS — K5190 Ulcerative colitis, unspecified, without complications: Secondary | ICD-10-CM

## 2015-09-24 MED ORDER — GOLIMUMAB 100 MG/ML ~~LOC~~ SOSY: 100.0000 mg | PREFILLED_SYRINGE | SUBCUTANEOUS | Status: DC

## 2015-09-24 NOTE — Progress Notes (Signed)
   S: Patient presents today to the [facility].  Patient is currently taking Simponi for ulcerative colitis. Patient is managed by Dr. Hilarie Fredrickson for this. Patient had been on Humira but was discovered to have antibodies to the Humira so she was switched to the Simponi last month. She reports feeling better since on the Simponi.  Adherence: denies any missed doses and rotates injection sites.   Dosing:  Ulcerative colitis:  [place] labeling: SubQ: Induction: 200 mg at week 0, then 100 mg at week 2, followed by maintenance therapy of 100 mg every 4 weeks   Drug-drug interactions: none  Screening: TB test: completed per patient Hepatitis: completed per patient  Monitoring: S/sx of infection: denies CBC: WNL S/sx of hypersensitivity: denies S/sx of malignancy: denies S/sx of heart failure: denies S/sx of autoimmune disorder: denies     O:     Lab Results  Component Value Date   WBC 6.6 07/17/2015   HGB 13.1 07/17/2015   HCT 38.8 07/17/2015   MCV 85.8 07/17/2015   PLT 340.0 07/17/2015      Chemistry      Component Value Date/Time   NA 136 07/17/2015 0950   K 4.1 07/17/2015 0950   CL 103 07/17/2015 0950   CO2 26 07/17/2015 0950   BUN 13 07/17/2015 0950   CREATININE 0.62 07/17/2015 0950   CREATININE 0.58 06/07/2014 1222      Component Value Date/Time   CALCIUM 9.6 07/17/2015 0950   ALKPHOS 71 07/17/2015 0950   AST 14 07/17/2015 0950   ALT 8 07/17/2015 0950   BILITOT 0.5 07/17/2015 0950       A/P: 1. Medication review: patient recently started on Simponi for UC and is tolerating it well with no adverse effects and improved control of UC. Reviewed the medication with her, including the following: Simponi, golimumab, is a TNF blocker.  There is an increased risk of infection and malignancy with this medication. Do not give patients live vaccinations while they are on this medication. No recommendations for changes.     Nicoletta Ba, PharmD, BCPS, [facility] [phone]

## 2015-09-27 MED FILL — NORETHIN-ESTRAD-FERR 1-0.02: 1-20 | 84 days supply | Qty: 84 | Fill #1

## 2015-10-03 ENCOUNTER — Encounter: Payer: Self-pay | Admitting: Internal Medicine

## 2015-10-10 ENCOUNTER — Other Ambulatory Visit: Payer: Self-pay | Admitting: Internal Medicine

## 2015-10-10 MED FILL — SIMPONI 100 MG/ML SOAJ: 100 | 28 days supply | Qty: 1 | Fill #0

## 2015-10-10 MED FILL — LIALDA 1.2 GM TABLET SA: 1.2 | 30 days supply | Qty: 120 | Fill #0

## 2015-11-08 MED FILL — FINACEA 15% FOAM: 15 | 30 days supply | Qty: 50 | Fill #2

## 2015-11-08 MED FILL — MESALAMINE DR 1.2 GM TABLET: 1.2 | 30 days supply | Qty: 120 | Fill #1

## 2015-11-08 MED FILL — SIMPONI 100 MG/ML SOAJ: 100 | 28 days supply | Qty: 1 | Fill #1

## 2015-11-15 ENCOUNTER — Other Ambulatory Visit: Payer: Self-pay | Admitting: Obstetrics and Gynecology

## 2015-11-15 DIAGNOSIS — Z1231 Encounter for screening mammogram for malignant neoplasm of breast: Secondary | ICD-10-CM

## 2015-12-10 MED FILL — SIMPONI 100 MG/ML SOAJ: 100 | 28 days supply | Qty: 1 | Fill #2

## 2015-12-10 MED FILL — NORETHIN-ESTRAD-FERR 1-0.02: 1-20 | 84 days supply | Qty: 84 | Fill #2

## 2015-12-10 MED FILL — MESALAMINE DR 1.2 GM TABLET: 1.2 | 30 days supply | Qty: 120 | Fill #2

## 2015-12-16 ENCOUNTER — Ambulatory Visit
Admission: RE | Admit: 2015-12-16 | Discharge: 2015-12-16 | Disposition: A | Discharge status: Home / Self Care | Payer: 59 | Source: Ambulatory Visit | Attending: Obstetrics and Gynecology | Admitting: Obstetrics and Gynecology

## 2015-12-16 DIAGNOSIS — Z1231 Encounter for screening mammogram for malignant neoplasm of breast: Secondary | ICD-10-CM

## 2016-01-07 ENCOUNTER — Other Ambulatory Visit: Payer: Self-pay | Admitting: Internal Medicine

## 2016-01-07 DIAGNOSIS — L821 Other seborrheic keratosis: Secondary | ICD-10-CM | POA: Diagnosis not present

## 2016-01-07 DIAGNOSIS — L820 Inflamed seborrheic keratosis: Secondary | ICD-10-CM | POA: Diagnosis not present

## 2016-01-07 DIAGNOSIS — L570 Actinic keratosis: Secondary | ICD-10-CM | POA: Diagnosis not present

## 2016-01-07 DIAGNOSIS — L814 Other melanin hyperpigmentation: Secondary | ICD-10-CM | POA: Diagnosis not present

## 2016-01-07 DIAGNOSIS — C44519 Basal cell carcinoma of skin of other part of trunk: Secondary | ICD-10-CM | POA: Diagnosis not present

## 2016-01-07 DIAGNOSIS — L718 Other rosacea: Secondary | ICD-10-CM | POA: Diagnosis not present

## 2016-01-07 DIAGNOSIS — D1801 Hemangioma of skin and subcutaneous tissue: Secondary | ICD-10-CM | POA: Diagnosis not present

## 2016-01-07 DIAGNOSIS — D235 Other benign neoplasm of skin of trunk: Secondary | ICD-10-CM | POA: Diagnosis not present

## 2016-01-07 MED FILL — SIMPONI 100 MG/ML SOAJ: 100 | 28 days supply | Qty: 1 | Fill #3

## 2016-01-07 MED FILL — RHOFADE 1% CREAM: 1 | 30 days supply | Qty: 30 | Fill #0

## 2016-01-07 MED FILL — MESALAMINE DR 1.2 GM TABLET: 1.2 | 30 days supply | Qty: 120 | Fill #0

## 2016-01-21 DIAGNOSIS — C44519 Basal cell carcinoma of skin of other part of trunk: Secondary | ICD-10-CM | POA: Diagnosis not present

## 2016-01-23 DIAGNOSIS — R87610 Atypical squamous cells of undetermined significance on cytologic smear of cervix (ASC-US): Secondary | ICD-10-CM | POA: Diagnosis not present

## 2016-01-23 DIAGNOSIS — Z130 Encounter for screening for diseases of the blood and blood-forming organs and certain disorders involving the immune mechanism: Secondary | ICD-10-CM | POA: Diagnosis not present

## 2016-01-23 DIAGNOSIS — Z0000 Encounter for general adult medical examination without abnormal findings: Secondary | ICD-10-CM | POA: Diagnosis not present

## 2016-01-23 DIAGNOSIS — Z6826 Body mass index (BMI) 26.0-26.9, adult: Secondary | ICD-10-CM | POA: Diagnosis not present

## 2016-01-23 DIAGNOSIS — Z01419 Encounter for gynecological examination (general) (routine) without abnormal findings: Secondary | ICD-10-CM | POA: Diagnosis not present

## 2016-01-23 DIAGNOSIS — Z1329 Encounter for screening for other suspected endocrine disorder: Secondary | ICD-10-CM | POA: Diagnosis not present

## 2016-01-23 DIAGNOSIS — Z13220 Encounter for screening for lipoid disorders: Secondary | ICD-10-CM | POA: Diagnosis not present

## 2016-01-23 DIAGNOSIS — Z131 Encounter for screening for diabetes mellitus: Secondary | ICD-10-CM | POA: Diagnosis not present

## 2016-01-23 DIAGNOSIS — Z1151 Encounter for screening for human papillomavirus (HPV): Secondary | ICD-10-CM | POA: Diagnosis not present

## 2016-01-23 DIAGNOSIS — R232 Flushing: Secondary | ICD-10-CM | POA: Diagnosis not present

## 2016-01-23 MED FILL — SERTRALINE HCL 50 MG TABLET: 50 | 90 days supply | Qty: 90 | Fill #0

## 2016-02-03 ENCOUNTER — Other Ambulatory Visit (INDEPENDENT_AMBULATORY_CARE_PROVIDER_SITE_OTHER): Payer: 59

## 2016-02-03 ENCOUNTER — Ambulatory Visit (INDEPENDENT_AMBULATORY_CARE_PROVIDER_SITE_OTHER): Payer: 59 | Admitting: Internal Medicine

## 2016-02-03 ENCOUNTER — Encounter: Payer: Self-pay | Admitting: Internal Medicine

## 2016-02-03 VITALS — BP 110/76 | HR 64 | Ht 64.0 in | Wt 152.0 lb

## 2016-02-03 DIAGNOSIS — K51919 Ulcerative colitis, unspecified with unspecified complications: Secondary | ICD-10-CM

## 2016-02-03 DIAGNOSIS — K5150 Left sided colitis without complications: Secondary | ICD-10-CM | POA: Diagnosis not present

## 2016-02-03 LAB — CBC WITH DIFFERENTIAL/PLATELET
Basophils Absolute: 0 10*3/uL (ref 0.0–0.1)
Basophils Relative: 0.4 % (ref 0.0–3.0)
Eosinophils Absolute: 0.2 10*3/uL (ref 0.0–0.7)
Eosinophils Relative: 2.4 % (ref 0.0–5.0)
HCT: 41.8 % (ref 36.0–46.0)
Hemoglobin: 14.4 g/dL (ref 12.0–15.0)
Lymphocytes Relative: 25.9 % (ref 12.0–46.0)
Lymphs Abs: 2.2 10*3/uL (ref 0.7–4.0)
MCHC: 34.4 g/dL (ref 30.0–36.0)
MCV: 87.4 fl (ref 78.0–100.0)
Monocytes Absolute: 0.7 10*3/uL (ref 0.1–1.0)
Monocytes Relative: 8.5 % (ref 3.0–12.0)
Neutro Abs: 5.3 10*3/uL (ref 1.4–7.7)
Neutrophils Relative %: 62.8 % (ref 43.0–77.0)
Platelets: 335 10*3/uL (ref 150.0–400.0)
RBC: 4.78 Mil/uL (ref 3.87–5.11)
RDW: 13.1 % (ref 11.5–15.5)
WBC: 8.5 10*3/uL (ref 4.0–10.5)

## 2016-02-03 LAB — COMPREHENSIVE METABOLIC PANEL
ALK PHOS: 58 U/L (ref 39–117)
ALT: 10 U/L (ref 0–35)
AST: 13 U/L (ref 0–37)
Albumin: 4.4 g/dL (ref 3.5–5.2)
BILIRUBIN TOTAL: 0.5 mg/dL (ref 0.2–1.2)
BUN: 14 mg/dL (ref 6–23)
CO2: 27 mEq/L (ref 19–32)
Calcium: 9.7 mg/dL (ref 8.4–10.5)
Chloride: 100 mEq/L (ref 96–112)
Creatinine, Ser: 0.61 mg/dL (ref 0.40–1.20)
GFR: 110.97 mL/min (ref >60.00)
GLUCOSE: 92 mg/dL (ref 70–99)
Potassium: 4 mEq/L (ref 3.5–5.1)
SODIUM: 136 meq/L (ref 135–145)
TOTAL PROTEIN: 7.7 g/dL (ref 6.0–8.3)

## 2016-02-03 LAB — HIGH SENSITIVITY CRP: CRP HIGH SENSITIVITY: 4.21 mg/L (ref 0.000–5.000)

## 2016-02-03 MED ORDER — MESALAMINE 1.2 G PO TBEC: 4.8000 g | DELAYED_RELEASE_TABLET | Freq: Every day | ORAL | 5 refills | Status: DC

## 2016-02-03 NOTE — Progress Notes (Signed)
Subjective:    Patient ID: [name], female    DOB: 29-Jan-1966, 50 y.o.   MRN: [id number]  HPI [name] is a 50 year old female with chronic long-standing left-sided ulcerative colitis who is here for follow-up. She was last in the office in May 2017 and for surveillance colonoscopy on 08/28/2015. She was previous he maintained on Humira but when antibodies were discovered we switched her to Simponi.  Her colonoscopy performed on 08/28/2015 showed inflammation continuous and circumferential recommended ascending colon. This was mild. The remainder of the exam was unremarkable. Biopsies from the right colon were benign without inflammation, left colon and rectum showed mildly to moderately active chronic colitis without dysplasia.  Since starting Simponi she reports dramatic improvement in her symptoms. She reports she feels "as normal as I have in years". Bowel movements are regular without blood or melena. No abdominal pain. Good appetite. She's gained about 10 pounds since starting the medication which she feels is related to how good she feels. No upper GI or hepatobiliary complaint. She has continued Lialda 4.8 g daily without trouble. Joint pains have also improved   Review of Systems As per history of present illness, otherwise negative  Current Medications, Allergies, Past Medical History, Past Surgical History, Family History and Social History were reviewed in [employer] record.     Objective:   Physical Exam BP 110/76   Pulse 64   Ht 5' 4"  (1.626 m)   Wt 152 lb (68.9 kg)   BMI 26.09 kg/m  Constitutional: Well-developed and well-nourished. No distress. HEENT: Normocephalic and atraumatic. Oropharynx is clear and moist. No oropharyngeal exudate. Conjunctivae are normal.  No scleral icterus. Neck: Neck supple. Trachea midline. Cardiovascular: Normal rate, regular rhythm and intact distal pulses. No M/R/G Pulmonary/chest: Effort normal and breath  sounds normal. No wheezing, rales or rhonchi. Abdominal: Soft, nontender, nondistended. Bowel sounds active throughout. There are no masses palpable. No hepatosplenomegaly. Extremities: no clubbing, cyanosis, or edema Lymphadenopathy: No cervical adenopathy noted. Neurological: Alert and oriented to person place and time. Skin: Skin is warm and dry. No rashes noted. Psychiatric: Normal mood and affect. Behavior is normal.  CBC    Component Value Date/Time   WBC 8.5 02/03/2016 1410   RBC 4.78 02/03/2016 1410   HGB 14.4 02/03/2016 1410   HCT 41.8 02/03/2016 1410   PLT 335.0 02/03/2016 1410   MCV 87.4 02/03/2016 1410   MCH 29.0 06/07/2014 1221   MCHC 34.4 02/03/2016 1410   RDW 13.1 02/03/2016 1410   LYMPHSABS 2.2 02/03/2016 1410   MONOABS 0.7 02/03/2016 1410   EOSABS 0.2 02/03/2016 1410   BASOSABS 0.0 02/03/2016 1410   CMP     Component Value Date/Time   NA 136 02/03/2016 1410   K 4.0 02/03/2016 1410   CL 100 02/03/2016 1410   CO2 27 02/03/2016 1410   GLUCOSE 92 02/03/2016 1410   BUN 14 02/03/2016 1410   CREATININE 0.61 02/03/2016 1410   CREATININE 0.58 06/07/2014 1222   CALCIUM 9.7 02/03/2016 1410   PROT 7.7 02/03/2016 1410   ALBUMIN 4.4 02/03/2016 1410   AST 13 02/03/2016 1410   ALT 10 02/03/2016 1410   ALKPHOS 58 02/03/2016 1410   BILITOT 0.5 02/03/2016 1410   GFRNONAA >89 06/07/2014 1222   GFRAA >89 06/07/2014 1222       Assessment & Plan:  50 year old female with chronic long-standing left-sided ulcerative colitis who is here for follow-up.  1. Left-sided colitis -- clinical remission on Simponi  and Lialda.  Continue Simponi 100 mg every 28 days and Lialda 4.8 g daily. She will continue her multivitamin and vitamin D3 supplementation. Follow-up in 9-12 months. CBC, CRP and CMP today. She has had flu vaccine this year. Surveillance colonoscopy is recommended 2 years from her last which would be June 2019.

## 2016-02-03 NOTE — Patient Instructions (Signed)
We have sent the following medications to your pharmacy for you to pick up at your convenience: Lialda 4.8 grams daily  Continue Simponi.  Your physician has requested that you go to the basement for the following lab work before leaving today: CBC, CMP, CRP  Please follow up with Dr Hilarie Fredrickson in 9-12 months.  If you are age 48 or older, your body mass index should be between 23-30. Your Body mass index is 26.09 kg/m. If this is out of the aforementioned range listed, please consider follow up with your Primary Care Provider.  If you are age 84 or younger, your body mass index should be between 19-25. Your Body mass index is 26.09 kg/m. If this is out of the aformentioned range listed, please consider follow up with your Primary Care Provider.

## 2016-02-11 MED FILL — SIMPONI 100 MG/ML SOAJ: 100 | 28 days supply | Qty: 1 | Fill #4

## 2016-02-12 MED FILL — MESALAMINE DR 1.2 GM TABLET: 1.2 | 30 days supply | Qty: 120 | Fill #0

## 2016-03-03 ENCOUNTER — Ambulatory Visit (INDEPENDENT_AMBULATORY_CARE_PROVIDER_SITE_OTHER): Payer: 59 | Admitting: Family Medicine

## 2016-03-03 ENCOUNTER — Encounter: Payer: Self-pay | Admitting: Family Medicine

## 2016-03-03 VITALS — BP 102/64 | HR 70 | Temp 98.6°F | Resp 14 | Ht 64.0 in | Wt 149.0 lb

## 2016-03-03 DIAGNOSIS — L03119 Cellulitis of unspecified part of limb: Secondary | ICD-10-CM

## 2016-03-03 DIAGNOSIS — L02419 Cutaneous abscess of limb, unspecified: Secondary | ICD-10-CM

## 2016-03-03 MED ORDER — SULFAMETHOXAZOLE-TRIMETHOPRIM 800-160 MG PO TABS: 1.0000 | ORAL_TABLET | Freq: Two times a day (BID) | ORAL | 0 refills | Status: DC

## 2016-03-03 MED FILL — SULFAMETHOXAZOLE/TMP DS TAB: 800-160 | 7 days supply | Qty: 14 | Fill #0

## 2016-03-03 NOTE — Progress Notes (Signed)
   Subjective:    Patient ID: [name], female    DOB: 08-05-1965, 50 y.o.   MRN: [id number]  Patient presents for Abscess to L Lower Leg (x3 weeks- states area began like ingrown hair, but now has increased swelling and redness to area- warm to touch) Patient here with infection to her left shin. She states that initially it looked like an infected ingrown hair. She used topical antibiotic ointment on it this was about 3 weeks ago. Over the past 4-5 days however it is now progressive is more swollen and red she's not had any drainage but it is tender. She's felt a little fatigued but has not had any fever or chills. She thought she was initially coming down with a little cold but those symptoms have now resolved.  She is on immunosuppressive for her coli this  Review Of Systems:  GEN- denies fatigue, fever, weight loss,weakness, recent illness HEENT- denies eye drainage, change in vision, nasal discharge, CVS- denies chest pain, palpitations RESP- denies SOB, cough, wheeze ABD- denies N/V, change in stools, abd pain Neuro- denies headache, dizziness, syncope, seizure activity       Objective:    BP 102/64 (BP Location: Left Arm, Patient Position: Sitting, Cuff Size: Normal)   Pulse 70   Temp 98.6 F (37 C) (Oral)   Resp 14   Ht 5' 4"  (1.626 m)   Wt 149 lb (67.6 kg)   SpO2 99%   BMI 25.58 kg/m  GEN- NAD, alert and oriented x3 HEENT- PERRL, EOMI, non injected sclera, pink conjunctiva, MMM, oropharynx clear Neck- Supple, no thyromegaly CVS- RRR, no murmur RESP-CTAB  Skin- Left leg over Shin- dime size induration with fluctuance, erythema quarter size surrounding with swelling, TTP, + warmth  Procedure- Incision and Drainage Procedure explained to patient questions answered benefits and risks discussed written consent obtained. Antiseptic-Betadine Anesthesia-lidocaine with Epi  Incision performed small amount of pus expressed Culture taken Minimal blood loss Patient  tolerated procedure well Bandage applied with 2x2 into small incision          Assessment & Plan:      Problem List Items Addressed This Visit    None    Visit Diagnoses    Cellulitis and abscess of leg    -  Primary   Treat with bactrim, culture sent, discussed wound care, no packing placed as not very deep abscess   Relevant Orders   WOUND CULTURE      Note: This dictation was prepared with Dragon dictation along with smaller phrase technology. Any transcriptional errors that result from this process are unintentional.

## 2016-03-03 NOTE — Patient Instructions (Signed)
Antibiotics as prescribed F/U as needed

## 2016-03-06 DIAGNOSIS — L905 Scar conditions and fibrosis of skin: Secondary | ICD-10-CM | POA: Diagnosis not present

## 2016-03-06 DIAGNOSIS — Z85828 Personal history of other malignant neoplasm of skin: Secondary | ICD-10-CM | POA: Diagnosis not present

## 2016-03-06 LAB — WOUND CULTURE
Gram Stain: NONE SEEN
Gram Stain: NONE SEEN
Organism ID, Bacteria: NO GROWTH

## 2016-03-12 ENCOUNTER — Encounter: Payer: Self-pay | Admitting: Physician Assistant

## 2016-03-12 ENCOUNTER — Ambulatory Visit (INDEPENDENT_AMBULATORY_CARE_PROVIDER_SITE_OTHER): Payer: 59 | Admitting: Physician Assistant

## 2016-03-12 VITALS — BP 122/80 | HR 72 | Temp 97.6°F | Resp 18 | Wt 150.0 lb

## 2016-03-12 DIAGNOSIS — L02419 Cutaneous abscess of limb, unspecified: Secondary | ICD-10-CM | POA: Diagnosis not present

## 2016-03-12 DIAGNOSIS — L03119 Cellulitis of unspecified part of limb: Secondary | ICD-10-CM | POA: Diagnosis not present

## 2016-03-12 MED ORDER — DOXYCYCLINE HYCLATE 100 MG PO TABS: 100.0000 mg | ORAL_TABLET | Freq: Two times a day (BID) | ORAL | 0 refills | Status: DC

## 2016-03-12 MED ORDER — SULFAMETHOXAZOLE-TRIMETHOPRIM 800-160 MG PO TABS: 1.0000 | ORAL_TABLET | Freq: Two times a day (BID) | ORAL | 0 refills | Status: DC

## 2016-03-12 MED FILL — SIMPONI 100 MG/ML SOAJ: 100 | 28 days supply | Qty: 1 | Fill #5

## 2016-03-12 MED FILL — NORETHIN-ESTRAD-FERR 1-0.02: 1-20 | 84 days supply | Qty: 84 | Fill #0

## 2016-03-12 MED FILL — MESALAMINE DR 1.2 GM TABLET: 1.2 | 30 days supply | Qty: 120 | Fill #1

## 2016-03-12 MED FILL — SULFAMETHOXAZOLE/TMP DS TAB: 800-160 | 10 days supply | Qty: 20 | Fill #0

## 2016-03-12 MED FILL — DOXYCYCLINE HYCLATE 100 MG: 100 | 10 days supply | Qty: 20 | Fill #0

## 2016-03-12 NOTE — Progress Notes (Addendum)
Patient ID: [name] MRN: [id number], DOB: [birth year], 50 y.o. Date of Encounter: 03/12/2016, 4:43 PM    Chief Complaint:  Chief Complaint  Patient presents with  . wound on left leg  .      HPI: 50 y.o. year old female presents with above.   Reviewed her office note by Dr. Buelah Manis 03/03/16. That note stated that patient presented for abscess of left lower leg. Said that she had first noticed an area about 3 weeks prior that looked like an ingrown hair but since then had increased in swelling and redness and had become warm to the touch. When she first noticed the area that look like an ingrown hair she applied some topical antibiotic ointment. However then over the past 4-5 days prior to her visit with Dr. Buelah Manis she had noticed progressive increase in swelling and redness. She had not had any drainage from the site. Was noted that she is on immunosuppressive therapy for her colitis. At that visit Dr. Buelah Manis performed incision and drainage. Culture was sent. Patient was started on Bactrim. Culture results showed no growth.  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 She showed her leg to the doctor in the ICU and he did another I&D. Says that he opened it and got out tons of drainage and even had to use packing. Told her to complete the Bactrim.  She states that she worked Tuesday and Wednesday of this week as additional help to cover for the holidays. Says that being on her feet for all of those shifts the site was hurting and she had noticed some swelling. States that she took the last dose of the Bactrim Tuesday night. Looking at her leg now compared to when she had seen Dr. Buelah Manis she says that she doesn't think that it's a lot better but doesn't think it's worse--thinks it's overall about the same.     Home Meds:   Outpatient Medications Prior to Visit  Medication Sig Dispense Refill  .  CALCIUM PO Take by mouth. 2 daily    . Cholecalciferol (VITAMIN D3) 2000 UNITS TABS Take 1 tablet by mouth daily.    Marland Kitchen FINACEA 15 % FOAM   2  . Golimumab 100 MG/ML SOSY Inject 100 mg into the skin every 28 (twenty-eight) days. 1 Syringe 10  . mesalamine (LIALDA) 1.2 g EC tablet Take 4 tablets (4.8 g total) by mouth daily with breakfast. Pharmacy- please give patient with AG mesalamine for Lialda. No substitutions permitted. 120 tablet 5  . Multiple Vitamin (MULTIVITAMIN) tablet Take 1 tablet by mouth daily.    . norgestrel-ethinyl estradiol (LO/OVRAL,CRYSELLE) 0.3-30 MG-MCG tablet Take 1 tablet by mouth daily.    . sertraline (ZOLOFT) 50 MG tablet Take 50 mg by mouth daily.    Marland Kitchen sulfamethoxazole-trimethoprim (BACTRIM DS,SEPTRA DS) 800-160 MG tablet Take 1 tablet by mouth 2 (two) times daily. (Patient not taking: Reported on 03/12/2016) 14 tablet 0   No facility-administered medications prior to visit.     Allergies: No Known Allergies    Review of Systems: See HPI for pertinent ROS. All other ROS negative.    Physical Exam: Blood pressure 122/80, pulse 72, temperature 97.6 F (36.4 C), temperature source Oral, resp. rate 18, weight 150 lb (68 kg), last menstrual period 02/19/2016, SpO2 99 %., Body mass index is 25.75 kg/m. General: WNWD WF.  Appears in no  acute distress. Neck: Supple. No thyromegaly. No lymphadenopathy. Lungs: Clear bilaterally to auscultation without wheezes, rales, or rhonchi. Breathing is unlabored. Heart: Regular rhythm. No murmurs, rubs, or gallops. Msk:  Strength and tone normal for age. Extremities/Skin: Left Anterior Shin: Outer area of erythema measures 5cm diameter. Inner area of erythema measures 2 cm diameter.  Site palpated. No drainage coming from site. Non-fluctuant.   Neuro: Alert and oriented X 3. Moves all extremities spontaneously. Gait is normal. CNII-XII grossly in tact. Psych:  Responds to questions appropriately with a normal affect.      ASSESSMENT AND PLAN:  50 y.o. year old female with  1. Cellulitis and abscess of leg Discussed treatment options at this point. Had been reluctant to use doxycycline because in the past this had caused her colitis to flare. However at this point is agreeable to add doxycycline as well as continue Bactrim. If she starts to develop diarrhea/flare of colitis then can stop the doxycycline immediately. Otherwise will use another round of Bactrim and add doxycycline with this. Will schedule follow-up visit here with Dr. Buelah Manis for Tuesday for follow-up. If site worsens in the interim go to the ER. Also note given for her to stay out of work this upcoming weekend which is her only scheduled shift of work this week. - doxycycline (VIBRA-TABS) 100 MG tablet; Take 1 tablet (100 mg total) by mouth 2 (two) times daily.  Dispense: 20 tablet; Refill: 0 - sulfamethoxazole-trimethoprim (BACTRIM DS,SEPTRA DS) 800-160 MG tablet; Take 1 tablet by mouth 2 (two) times daily.  Dispense: 20 tablet; Refill: 0   Signed, [address] [place], [place], [facility] 03/12/2016 4:43 PM

## 2016-03-18 ENCOUNTER — Ambulatory Visit: Payer: 59 | Admitting: Physician Assistant

## 2016-03-19 ENCOUNTER — Ambulatory Visit (HOSPITAL_COMMUNITY)
Admission: EM | Admit: 2016-03-19 | Discharge: 2016-03-19 | Disposition: A | Discharge status: Home / Self Care | Payer: 59 | Attending: Emergency Medicine | Admitting: Emergency Medicine

## 2016-03-19 ENCOUNTER — Encounter (HOSPITAL_COMMUNITY): Payer: Self-pay | Admitting: Emergency Medicine

## 2016-03-19 DIAGNOSIS — Z79899 Other long term (current) drug therapy: Secondary | ICD-10-CM | POA: Insufficient documentation

## 2016-03-19 DIAGNOSIS — L02818 Cutaneous abscess of other sites: Secondary | ICD-10-CM | POA: Diagnosis present

## 2016-03-19 DIAGNOSIS — L03116 Cellulitis of left lower limb: Secondary | ICD-10-CM | POA: Insufficient documentation

## 2016-03-19 MED ORDER — SODIUM CHLORIDE 0.9 % IN NEBU
INHALATION_SOLUTION | RESPIRATORY_TRACT | Status: AC
  Filled 2016-03-19: qty 6

## 2016-03-19 NOTE — ED Provider Notes (Signed)
CSN: [id number]     Arrival date & time 03/19/16  1353 History   First MD Initiated Contact with Patient 03/19/16 1438     Chief Complaint  Patient presents with  . 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 He continued to enlarge and collect subdermal purulent material. It was incised and drained and cultured and she was treated with Bactrim. She states that the culture did not grow out any pathogens. She did not resolve with Bactrim she had a second I&D after she had formed a larger abscess and it was packed at that time. She followed up with her PCP and placed on Bactrim and Doxy. She is toward the end of that course. Today she noticed what appeared to be pus coming from the initial incision that had been healing well. The erythema surrounding the wound has increased mildly. It is puffy but she states that part is not changed. She has no fever.      Past Medical History:  Diagnosis Date  . DDD (degenerative disc disease), lumbar    S-I joints  . Ulcerative colitis    Past Surgical History:  Procedure Laterality Date  . ABDOMINAL HERNIA REPAIR     age 64  . COLONOSCOPY  05/12/2012   multiple   . TONSILLECTOMY     Family History  Problem Relation Age of Onset  . Macular degeneration Father   . Lung cancer Father 31  . Breast cancer Mother   . Hyperlipidemia Mother   . Glaucoma Mother   . Colon cancer Neg Hx    Social History  Substance Use Topics  . Smoking status: Never Smoker  . Smokeless tobacco: Never Used  . Alcohol use No   OB History    No data available     Review of Systems  Constitutional: Negative.   HENT: Negative.   Respiratory: Negative.   Gastrointestinal: Negative.   Skin:       As per history of present illness  Neurological: Negative.   All other systems  reviewed and are negative.   Allergies  Patient has no known allergies.  Home Medications   Prior to Admission medications   Medication Sig Start Date End Date Taking? Authorizing Provider  CALCIUM PO Take by mouth. 2 daily   Yes Historical Provider, MD  Cholecalciferol (VITAMIN D3) 2000 UNITS TABS Take 1 tablet by mouth daily.   Yes Historical Provider, MD  doxycycline (VIBRA-TABS) 100 MG tablet Take 1 tablet (100 mg total) by mouth 2 (two) times daily. 03/12/16  Yes Orlena Sheldon, PA-C  mesalamine (LIALDA) 1.2 g EC tablet Take 4 tablets (4.8 g total) by mouth daily with breakfast. Pharmacy- please give patient with AG mesalamine for Lialda. No substitutions permitted. 02/03/16  Yes Jerene Bears, MD  Multiple Vitamin (MULTIVITAMIN) tablet Take 1 tablet by mouth daily.   Yes Historical Provider, MD  norgestrel-ethinyl estradiol (LO/OVRAL,CRYSELLE) 0.3-30 MG-MCG tablet Take 1 tablet by mouth daily.   Yes Historical Provider, MD  sertraline (ZOLOFT) 50 MG tablet Take 50 mg by mouth daily.   Yes Historical Provider, MD  sulfamethoxazole-trimethoprim (BACTRIM DS,SEPTRA DS) 800-160 MG tablet Take 1 tablet by mouth 2 (two) times daily. 03/12/16  Yes Orlena Sheldon, PA-C  FINACEA 15 % FOAM  07/02/15   Historical Provider, MD  Golimumab 100 MG/ML SOSY Inject 100  mg into the skin every 28 (twenty-eight) days. 09/24/15   Tresa Garter, MD   Meds Ordered and Administered this Visit  Medications - No data to display  BP 136/61 (BP Location: Left Arm)   Pulse 75   Temp 98.7 F (37.1 C) (Oral)   LMP 02/27/2016 (Approximate)   SpO2 99%  No data found.   Physical Exam  Constitutional: She is oriented to person, place, and time. She appears well-developed and well-nourished.  Pulmonary/Chest: Effort normal.  Musculoskeletal: Normal range of motion.  Neurological: She is alert and oriented to person, place, and time.  Skin: Skin is warm and dry.  The area of puffiness and erythema is  approximately 4.3 cm in diameter. No lymphangitis. The wound was manipulated to express fluid as it appeared relatively dry on arrival. There was serous fluid that was expressed, this was cultured and sent to lab. No actual printed material was obtained. The wound does not feel fluctuant and this time I believe it doubtful that there is underlying purulence. See photographs.  Psychiatric: She has a normal mood and affect.  Nursing note and vitals reviewed.   Urgent Care Course   Clinical Course       With and without flash, same visit.  Procedures (including critical care time)  Labs Review Labs Reviewed - No data to display  Imaging Review No results found.   Visual Acuity Review  Right Eye Distance:   Left Eye Distance:   Bilateral Distance:    Right Eye Near:   Left Eye Near:    Bilateral Near:         MDM   1. Cellulitis of left lower extremity   Continue your antibiotics. Culture should have results and 48-72 hours. Follow-up with your primary care doctor. Continue to wash with mild soap and water. Do not recommend applying antibiotic ointment. Keep covered. For any worsening either see your PCP or may return. In all likelihood if this wound does get worse and you have been on antibiotics you probably would need to go to the emergency department for IV antibiotics.      Janne Napoleon, NP 03/19/16 [place], NP 03/19/16 1535

## 2016-03-19 NOTE — ED Triage Notes (Signed)
Pt has had an abscess on her left shin for 6 weeks.  She has been on a few courses of antibiotics and it has been drained twice.  Pt here today for purulent drainage from the wound that started two days ago.  Pt was unable to get in to see her PCP.  Pt denies any fever.

## 2016-03-19 NOTE — Discharge Instructions (Signed)
Continue your antibiotics. Culture should have results and 48-72 hours. Follow-up with your primary care doctor. Continue to wash with mild soap and water. Do not recommend applying antibiotic ointment. Keep covered. For any worsening either see your PCP or may return. In all likelihood if this wound does get worse and you have been on antibiotics you probably would need to go to the emergency department for IV antibiotics.

## 2016-03-19 NOTE — ED Notes (Signed)
Applied a moist small gauze dressing to the wound and covered with 2x2 gauze then 4x4 gauze.  Pt voiced understanding on how to apply the dressing to herself at home.  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

## 2016-03-20 ENCOUNTER — Encounter (HOSPITAL_COMMUNITY): Payer: Self-pay | Admitting: Emergency Medicine

## 2016-03-20 ENCOUNTER — Ambulatory Visit (HOSPITAL_COMMUNITY)
Admission: EM | Admit: 2016-03-20 | Discharge: 2016-03-20 | Disposition: A | Discharge status: Home / Self Care | Payer: 59 | Attending: Family Medicine | Admitting: Family Medicine

## 2016-03-20 DIAGNOSIS — T7840XA Allergy, unspecified, initial encounter: Secondary | ICD-10-CM

## 2016-03-20 MED ORDER — METHYLPREDNISOLONE SODIUM SUCC 125 MG IJ SOLR
INTRAMUSCULAR | Status: AC
  Filled 2016-03-20: qty 2

## 2016-03-20 MED ORDER — METHYLPREDNISOLONE 4 MG PO TBPK: ORAL_TABLET | ORAL | 0 refills | Status: DC

## 2016-03-20 MED ORDER — METHYLPREDNISOLONE SODIUM SUCC 125 MG IJ SOLR
125.0000 mg | Freq: Once | INTRAMUSCULAR | Status: AC
  Administered 2016-03-20: 125 mg via INTRAMUSCULAR

## 2016-03-20 MED FILL — METHYLPREDNISOLONE 4 MG TAB: 4 | 6 days supply | Qty: 21 | Fill #0

## 2016-03-20 NOTE — ED Provider Notes (Signed)
[place]    CSN: [id number] Arrival date & time: 03/20/16  1514     History   Chief Complaint Chief Complaint  Patient presents with  . Allergic Reaction    HPI [name] is a 51 y.o. female.   The history is provided by the patient.  Allergic Reaction  Presenting symptoms: itching and rash   Presenting symptoms: no difficulty breathing, no difficulty swallowing, no swelling and no wheezing   Severity:  Mild Duration:  1 day Prior allergic episodes:  No prior episodes Context comment:  Has had i=d x2 on leg for abscesson left lower leg, and seprtra and doxy for persistent abscess. today began rash all over, no swelling pr resp diff. Relieved by:  None tried Ineffective treatments:  None tried   Past Medical History:  Diagnosis Date  . DDD (degenerative disc disease), lumbar    S-I joints  . Ulcerative colitis     Patient Active Problem List   Diagnosis Date Noted  . RUQ abdominal pain 07/14/2013  . Nausea alone 07/14/2013  . LEFT SIDED ULCERATIVE COLITIS 08/15/2008    Past Surgical History:  Procedure Laterality Date  . ABDOMINAL HERNIA REPAIR     age 62  . COLONOSCOPY  05/12/2012   multiple   . TONSILLECTOMY      OB History    No data available       Home Medications    Prior to Admission medications   Medication Sig Start Date End Date Taking? Authorizing Provider  CALCIUM PO Take by mouth. 2 daily   Yes Historical Provider, MD  Cholecalciferol (VITAMIN D3) 2000 UNITS TABS Take 1 tablet by mouth daily.   Yes Historical Provider, MD  Golimumab 100 MG/ML SOSY Inject 100 mg into the skin every 28 (twenty-eight) days. 09/24/15  Yes Tresa Garter, MD  mesalamine (LIALDA) 1.2 g EC tablet Take 4 tablets (4.8 g total) by mouth daily with breakfast. Pharmacy- please give patient with AG mesalamine for Lialda. No substitutions permitted. 02/03/16  Yes Jerene Bears, MD  Multiple Vitamin (MULTIVITAMIN) tablet Take 1 tablet by mouth daily.    Yes Historical Provider, MD  norgestrel-ethinyl estradiol (LO/OVRAL,CRYSELLE) 0.3-30 MG-MCG tablet Take 1 tablet by mouth daily.   Yes Historical Provider, MD  sertraline (ZOLOFT) 50 MG tablet Take 50 mg by mouth daily.   Yes Historical Provider, MD  FINACEA 15 % FOAM  07/02/15   Historical Provider, MD  sulfamethoxazole-trimethoprim (BACTRIM DS,SEPTRA DS) 800-160 MG tablet Take 1 tablet by mouth 2 (two) times daily. 03/31/16   Alycia Rossetti, MD    Family History Family History  Problem Relation Age of Onset  . Macular degeneration Father   . Lung cancer Father 10  . Breast cancer Mother   . Hyperlipidemia Mother   . Glaucoma Mother   . Colon cancer Neg Hx     Social History Social History  Substance Use Topics  . Smoking status: Never Smoker  . Smokeless tobacco: Never Used  . Alcohol use No     Allergies   Doxycycline   Review of Systems Review of Systems  Constitutional: Negative.  Negative for fever.  HENT: Negative for trouble swallowing.   Respiratory: Negative for chest tightness, shortness of breath and wheezing.   Skin: Positive for itching and rash.  All other systems reviewed and are negative.    Physical Exam Triage Vital Signs ED Triage Vitals [03/20/16 1536]  Enc Vitals Group  BP 122/65     Pulse Rate 82     Resp (!) 86     Temp 98.1 F (36.7 C)     Temp Source Oral     SpO2 98 %     Weight      Height      Head Circumference      Peak Flow      Pain Score      Pain Loc      Pain Edu?      Excl. in [place]?    No data found.   Updated Vital Signs BP 122/65 (BP Location: Left Arm)   Pulse 82   Temp 98.1 F (36.7 C) (Oral)   Resp 18   LMP 02/27/2016 (Approximate)   SpO2 98%   Visual Acuity Right Eye Distance:   Left Eye Distance:   Bilateral Distance:    Right Eye Near:   Left Eye Near:    Bilateral Near:     Physical Exam  Constitutional: She is oriented to person, place, and time. She appears well-developed and  well-nourished. No distress.  HENT:  Head: Normocephalic and atraumatic.  Right Ear: External ear normal.  Mouth/Throat: Oropharynx is clear and moist.  Neck: Normal range of motion. Neck supple.  Cardiovascular: Normal rate, regular rhythm, normal heart sounds and intact distal pulses.   Pulmonary/Chest: Effort normal and breath sounds normal. No respiratory distress. She has no wheezes.  Lymphadenopathy:    She has no cervical adenopathy.  Neurological: She is alert and oriented to person, place, and time.  Skin: Rash noted. There is erythema.  Nursing note and vitals reviewed.    UC Treatments / Results  Labs (all labs ordered are listed, but only abnormal results are displayed) Labs Reviewed - No data to display  EKG  EKG Interpretation None       Radiology No results found.  Procedures Procedures (including critical care time)  Medications Ordered in UC Medications  methylPREDNISolone sodium succinate (SOLU-MEDROL) 125 mg/2 mL injection 125 mg (125 mg Intramuscular Given 03/20/16 1659)     Initial Impression / Assessment and Plan / UC Course  I have reviewed the triage vital signs and the nursing notes.  Pertinent labs & imaging results that were available during my care of the patient were reviewed by me and considered in my medical decision making (see chart for details).       Final Clinical Impressions(s) / UC Diagnoses   Final diagnoses:  Allergic reaction to drug, initial encounter    New Prescriptions Discharge Medication List as of 03/20/2016  4:59 PM    START taking these medications   Details  methylPREDNISolone (MEDROL DOSEPAK) 4 MG TBPK tablet follow package directions, start on sat, take until finished, Print         Billy Fischer, MD 04/16/16 1505

## 2016-03-20 NOTE — ED Triage Notes (Signed)
Here for poss allergic reaction to doxy and bactrim for a wound inf to LLE  Reports she's been on doxy x1 week and bactrim x4 days  States she woke up this am w/rash  [name] Claritin this am w/no relief  Denies dyspnea, odynophagia  A&O x4... NAD

## 2016-03-20 NOTE — ED Notes (Signed)
Bacitracin and bandaid placed to left anterior abscess

## 2016-03-20 NOTE — Discharge Instructions (Signed)
No more septra or doxy medicines, take all of prednisone med starting sat. See your doctor as planned, clean leg wound twice a day.

## 2016-03-22 LAB — AEROBIC CULTURE  (SUPERFICIAL SPECIMEN)

## 2016-03-22 LAB — AEROBIC CULTURE W GRAM STAIN (SUPERFICIAL SPECIMEN)
Culture: NO GROWTH
Special Requests: NORMAL

## 2016-03-23 ENCOUNTER — Encounter: Payer: Self-pay | Admitting: Physician Assistant

## 2016-03-23 ENCOUNTER — Ambulatory Visit (INDEPENDENT_AMBULATORY_CARE_PROVIDER_SITE_OTHER): Payer: 59 | Admitting: Physician Assistant

## 2016-03-23 VITALS — BP 108/80 | HR 66 | Temp 97.4°F | Resp 16 | Wt 154.8 lb

## 2016-03-23 DIAGNOSIS — L02419 Cutaneous abscess of limb, unspecified: Secondary | ICD-10-CM | POA: Diagnosis not present

## 2016-03-23 DIAGNOSIS — L03119 Cellulitis of unspecified part of limb: Secondary | ICD-10-CM | POA: Diagnosis not present

## 2016-03-23 NOTE — Progress Notes (Signed)
Patient ID: [name] MRN: [id number], DOB: 04-07-1965, 51 y.o. Date of Encounter: 03/23/2016, 12:46 PM    Chief Complaint:  Chief Complaint  Patient presents with  . wound on left leg  .      HPI: 51 y.o. year old female presents with above.   THE FOLLOWING IS COPIED FROM MY OV NOTE 03/12/2016: Reviewed her office note by Dr. Buelah Manis 03/03/16. That note stated that patient presented for abscess of left lower leg. Said that she had first noticed an area about 3 weeks prior that looked like an ingrown hair but since then had increased in swelling and redness and had become warm to the touch. When she first noticed the area that look like an ingrown hair she applied some topical antibiotic ointment. However then over the past 4-5 days prior to her visit with Dr. Buelah Manis she had noticed progressive increase in swelling and redness. She had not had any drainage from the site. Was noted that she is on immunosuppressive therapy for her colitis. At that visit Dr. Buelah Manis performed incision and drainage. Culture was sent. Patient was started on Bactrim. Culture results showed no growth.  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 She showed her leg to the doctor in the ICU and he did another I&D. Says that he opened it and got out tons of drainage and even had to use packing. Told her to complete the Bactrim.  She states that she worked Tuesday and Wednesday of this week as additional help to cover for the holidays. Says that being on her feet for all of those shifts the site was hurting and she had noticed some swelling. States that she took the last dose of the Bactrim Tuesday night. Looking at her leg now compared to when she had seen Dr. Buelah Manis she says that she doesn't think that it's a lot better but doesn't think it's worse--thinks it's overall about the same.  EXAM:  Extremities/Skin: Left Anterior  Shin: Outer area of erythema measures 5cm diameter. Inner area of erythema measures 2 cm diameter.  Site palpated. No drainage coming from site. Non-fluctuant.    ASSESSMENT AND PLAN:  51 y.o. year old female with  1. Cellulitis and abscess of leg Discussed treatment options at this point. Had been reluctant to use doxycycline because in the past this had caused her colitis to flare. However at this point is agreeable to add doxycycline as well as continue Bactrim. If she starts to develop diarrhea/flare of colitis then can stop the doxycycline immediately. Otherwise will use another round of Bactrim and add doxycycline with this. Will schedule follow-up visit here with Dr. Buelah Manis for Tuesday for follow-up. If site worsens in the interim go to the ER. Also note given for her to stay out of work this upcoming weekend which is her only scheduled shift of work this week. - doxycycline (VIBRA-TABS) 100 MG tablet; Take 1 tablet (100 mg total) by mouth 2 (two) times daily.  Dispense: 20 tablet; Refill: 0 - sulfamethoxazole-trimethoprim (BACTRIM DS,SEPTRA DS) 800-160 MG tablet; Take 1 tablet by mouth 2 (two) times daily.  Dispense: 20 tablet; Refill: 0  SINCE THAT OV WITH ME:  She went to the urgent care on 03/19/16. That day she had seen what appeared to be pus coming from the initial incision that had been healing well. As well the erythema surrounding the  wound had increased mildly. They measured the area of puffiness and erythema which was approximately 4.3 cm in diameter. The wound was manipulated to express fluid as it appeared relatively dry on arrival. There was serous fluid that was expressed and this was cultured and sent. No actual purulent material was obtained. And did not feel fluctuant at that time and was felt that there was no underlying purulence. Photos obtained -- are reviewed-- in the chart. At that time they told her to continue her current antibiotics. They would send off culture.  Recommended keeping the site covered. But if it worsens she would likely require IV antibiotics. Otherwise to follow-up with PCP.  She states that night she developed rash all over her body.  Went back to urgent care on 03/20/2016. Says that they gave her Solu-Medrol and a dose pack and told her to stop both the doxycycline and Bactrim.  She did stop both antibiotics at that time. The rash has resolved.  The abscess is improving/resolving. States that this appointment was scheduled when she was here last week and she just wanted to keep this appointment to have follow-up so that we could see that this was resolving. No concerns to address today.  Home Meds:   Outpatient Medications Prior to Visit  Medication Sig Dispense Refill  . CALCIUM PO Take by mouth. 2 daily    . Cholecalciferol (VITAMIN D3) 2000 UNITS TABS Take 1 tablet by mouth daily.    Marland Kitchen FINACEA 15 % FOAM   2  . Golimumab 100 MG/ML SOSY Inject 100 mg into the skin every 28 (twenty-eight) days. 1 Syringe 10  . mesalamine (LIALDA) 1.2 g EC tablet Take 4 tablets (4.8 g total) by mouth daily with breakfast. Pharmacy- please give patient with AG mesalamine for Lialda. No substitutions permitted. 120 tablet 5  . methylPREDNISolone (MEDROL DOSEPAK) 4 MG TBPK tablet follow package directions, start on sat, take until finished 21 tablet 0  . Multiple Vitamin (MULTIVITAMIN) tablet Take 1 tablet by mouth daily.    . norgestrel-ethinyl estradiol (LO/OVRAL,CRYSELLE) 0.3-30 MG-MCG tablet Take 1 tablet by mouth daily.    . sertraline (ZOLOFT) 50 MG tablet Take 50 mg by mouth daily.    Marland Kitchen doxycycline (VIBRA-TABS) 100 MG tablet Take 1 tablet (100 mg total) by mouth 2 (two) times daily. (Patient not taking: Reported on 03/23/2016) 20 tablet 0  . sulfamethoxazole-trimethoprim (BACTRIM DS,SEPTRA DS) 800-160 MG tablet Take 1 tablet by mouth 2 (two) times daily. (Patient not taking: Reported on 03/23/2016) 20 tablet 0   No facility-administered medications  prior to visit.     Allergies: No Known Allergies    Review of Systems: See HPI for pertinent ROS. All other ROS negative.    Physical Exam: Blood pressure 108/80, pulse 66, temperature 97.4 F (36.3 C), temperature source Oral, resp. rate 16, weight 154 lb 12.8 oz (70.2 kg), last menstrual period 02/27/2016, SpO2 99 %., Body mass index is 26.57 kg/m. General: WNWD WF.  Appears in no acute distress. Neck: Supple. No thyromegaly. No lymphadenopathy. Lungs: Clear bilaterally to auscultation without wheezes, rales, or rhonchi. Breathing is unlabored. Heart: Regular rhythm. No murmurs, rubs, or gallops. Msk:  Strength and tone normal for age. Extremities/Skin: Left Anterior Shin: Outer area of erythema measures 3.5 cm diameter. Inner area of erythema measures 1 cm diameter.  Site palpated. No drainage coming from site. Non-fluctuant.   The color of the erythema has changed today compared to LOV---it is no longer looking actively inflamed/actively infected.  Coloration today c/w resolving infxn/inflammation. Neuro: Alert and oriented X 3. Moves all extremities spontaneously. Gait is normal. CNII-XII grossly in tact. Psych:  Responds to questions appropriately with a normal affect.     ASSESSMENT AND PLAN:  51 y.o. year old female with  1. Cellulitis and abscess of leg Reviewed the culture performed at the urgent care and this is negative with no growth. Abscess is finally resolving/healing. Stay off of antibiotics. Closely monitor and be very careful with the site. If abscess starts to re-occur,  follow-up immediately. She voices understanding and agrees. 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  [address] [place], [place], [facility] 03/23/2016 12:46 PM

## 2016-03-31 ENCOUNTER — Ambulatory Visit (INDEPENDENT_AMBULATORY_CARE_PROVIDER_SITE_OTHER): Payer: 59 | Admitting: Family Medicine

## 2016-03-31 ENCOUNTER — Encounter: Payer: Self-pay | Admitting: Family Medicine

## 2016-03-31 VITALS — BP 110/74 | HR 70 | Temp 98.0°F | Resp 18 | Ht 64.0 in | Wt 154.0 lb

## 2016-03-31 DIAGNOSIS — L02419 Cutaneous abscess of limb, unspecified: Secondary | ICD-10-CM

## 2016-03-31 DIAGNOSIS — L03119 Cellulitis of unspecified part of limb: Secondary | ICD-10-CM | POA: Diagnosis not present

## 2016-03-31 MED ORDER — SULFAMETHOXAZOLE-TRIMETHOPRIM 800-160 MG PO TABS: 1.0000 | ORAL_TABLET | Freq: Two times a day (BID) | ORAL | 0 refills | Status: DC

## 2016-03-31 MED FILL — SULFAMETHOXAZOLE/TMP DS TAB: 800-160 | 21 days supply | Qty: 42 | Fill #0

## 2016-03-31 NOTE — Progress Notes (Signed)
   Subjective:    Patient ID: [name], female    DOB: [birth year], 51 y.o.   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 1/4 had repeat culture which was negative.  Area of erythema and induration decreased no further drainage seemed to be healing at check on 1/8 but past 3 days, erythema area of dark appearing tissue has returned, also felt "bad" no fever no chills She is on immunosuppresants Concern possible spider bite, denies any foreign body    Review Of Systems:  GEN- denies fatigue, fever, weight loss,weakness, recent illness HEENT- denies eye drainage, change in vision, nasal discharge, CVS- denies chest pain, palpitations RESP- denies SOB, cough, wheeze ABD- denies N/V, change in stools, abd pain GU- denies dysuria, hematuria, dribbling, incontinence MSK- denies joint pain, muscle aches, injury Neuro- denies headache, dizziness, syncope, seizure activity       Objective:    BP 110/74 (BP Location: Left Arm, Patient Position: Sitting, Cuff Size: Normal)   Pulse 70   Temp 98 F (36.7 C) (Oral)   Resp 18   Ht 5' 4"  (1.626 m)   Wt 154 lb (69.9 kg)   LMP 02/27/2016 (Approximate)   SpO2 99%   BMI 26.43 kg/m  GEN- NAD, alert and oriented x3 Ext-  Skin- Left leg over Shin- 3x2 cm are of erythema with some necrotic appearing tissue at center in 2 spots, mild TTP, no fluctuance  + warmth Pulse- DP 2+       Assessment & Plan:      Problem List Items Addressed This Visit    None    Visit Diagnoses    Cellulitis and abscess of leg    -  Primary   Concern this is not healing appears to have some necrotic tissue  forming, i think this needs a debridement by wound clinic, start bactrim again as she did tolererate No fever. ? If spider bite or just poor healing in immunocompromised state, cultures have been negative    Relevant Medications   sulfamethoxazole-trimethoprim (BACTRIM DS,SEPTRA DS) 800-160 MG tablet   Other Relevant Orders   Ambulatory referral to Wound Clinic      Note: This dictation was prepared with Dragon dictation along with smaller phrase technology. Any transcriptional errors that result from this process are unintentional.

## 2016-03-31 NOTE — Patient Instructions (Signed)
Referral to wound center  Restart bactrim

## 2016-04-03 ENCOUNTER — Ambulatory Visit: Payer: 59 | Admitting: Surgery

## 2016-04-06 ENCOUNTER — Encounter: Payer: 59 | Attending: Surgery | Admitting: Surgery

## 2016-04-06 DIAGNOSIS — K51918 Ulcerative colitis, unspecified with other complication: Secondary | ICD-10-CM | POA: Insufficient documentation

## 2016-04-06 DIAGNOSIS — D819 Combined immunodeficiency, unspecified: Secondary | ICD-10-CM | POA: Diagnosis not present

## 2016-04-06 DIAGNOSIS — L88 Pyoderma gangrenosum: Secondary | ICD-10-CM | POA: Insufficient documentation

## 2016-04-06 DIAGNOSIS — L97222 Non-pressure chronic ulcer of left calf with fat layer exposed: Secondary | ICD-10-CM | POA: Insufficient documentation

## 2016-04-07 NOTE — Progress Notes (Signed)
[name], [name] ([id number]) Visit Report for 04/06/2016 Abuse/Suicide Risk Screen Details Patient Name: [name], Kim A. Date of Service: 04/06/2016 9:45 AM Medical Record Number: [id number] Patient Account Number: [id number] Date of Birth/Sex: [birth year] (51 y.o. Female) Treating RN: Carolyne Fiscal, Debi Primary Care Javani Spratt: Vic Blackbird Other Clinician: Referring Dominique Ressel: Vic Blackbird Treating Doralyn Kirkes/Extender: Frann Rider in Treatment: 0 Abuse/Suicide Risk Screen Items Answer ABUSE/SUICIDE RISK SCREEN: Has anyone close to you tried to hurt or harm you recentlyo No Do you feel uncomfortable with anyone in your familyo No Has anyone forced you do things that you didnot want to doo No Do you have any thoughts of harming yourselfo No Patient displays signs or symptoms of abuse and/or neglect. No Electronic Signature(s) Signed: 04/06/2016 4:38:27 PM By: Alric Quan Entered By: Alric Quan on 04/06/2016 09:48:33 Schiefelbein, [place] AMarland Kitchen ([id number]) -------------------------------------------------------------------------------- Activities of Daily Living Details Patient Name: Kim Pandy A. Date of Service: 04/06/2016 9:45 AM Medical Record Number: [id number] Patient Account Number: [id number] Date of Birth/Sex: Jan 28, 1966 (51 y.o. Female) Treating RN: Carolyne Fiscal, Debi Primary Care Shaleen Talamantez: Vic Blackbird Other Clinician: Referring Simmie Camerer: Vic Blackbird Treating Deshunda Thackston/Extender: Frann Rider in Treatment: 0 Activities of Daily Living Items Answer Activities of Daily Living (Please select one for each item) Drive Automobile Completely Able Take Medications Completely Able Use Telephone Completely Able Care for Appearance Completely Able Use Toilet Completely Able Bath / Shower Completely Able Dress Self Completely Able Feed Self Completely Able Walk Completely Able Get In / Out Bed Completely Able Housework Completely Able Prepare Meals Completely  Able Handle Money Completely Able Shop for Self Completely Able Electronic Signature(s) Signed: 04/06/2016 4:38:27 PM By: Alric Quan Entered By: Alric Quan on 04/06/2016 09:48:49 Brickley, [name] ([id number]) -------------------------------------------------------------------------------- Education Assessment Details Patient Name: Kim Pandy A. Date of Service: 04/06/2016 9:45 AM Medical Record Number: [id number] Patient Account Number: [id number] Date of Birth/Sex: 08-18-1965 (51 y.o. Female) Treating RN: Carolyne Fiscal, Debi Primary Care Caspian Deleonardis: Vic Blackbird Other Clinician: Referring Celina Shiley: Vic Blackbird Treating Amaria Mundorf/Extender: Frann Rider in Treatment: 0 Primary Learner Assessed: Patient Learning Preferences/Education Level/Primary Language Learning Preference: Explanation, Printed Material Highest Education Level: College or Above Preferred Language: English Cognitive Barrier Assessment/Beliefs Language Barrier: No Translator Needed: No Memory Deficit: No Emotional Barrier: No Cultural/Religious Beliefs Affecting Medical No Care: Physical Barrier Assessment Impaired Vision: No Impaired Hearing: No Decreased Hand dexterity: No Knowledge/Comprehension Assessment Knowledge Level: High Comprehension Level: High Ability to understand written High instructions: Ability to understand verbal High instructions: Motivation Assessment Anxiety Level: Calm Cooperation: Cooperative Education Importance: Acknowledges Need Interest in Health Problems: Asks Questions Perception: Coherent Willingness to Engage in Self- High Management Activities: Readiness to Engage in Self- High Management Activities: Electronic Signature(s) [name], [name] ([id number]) Signed: 04/06/2016 4:38:27 PM By: Alric Quan Entered By: Alric Quan on 04/06/2016 09:49:17 [name]  ([id number]) -------------------------------------------------------------------------------- Fall Risk Assessment Details Patient Name: Kim Pandy A. Date of Service: 04/06/2016 9:45 AM Medical Record Number: [id number] Patient Account Number: [id number] Date of Birth/Sex: 1965/06/12 (51 y.o. Female) Treating RN: Carolyne Fiscal, Debi Primary Care Kehinde Bowdish: Vic Blackbird Other Clinician: Referring Teauna Dubach: Vic Blackbird Treating Tamaiya Bump/Extender: Frann Rider in Treatment: 0 Fall Risk Assessment Items Have you had 2 or more falls in the last 12 monthso 0 No Have you had any fall that resulted in injury in the last 12 monthso 0 No FALL RISK ASSESSMENT: History of falling - immediate or within 3 months 0 No Secondary diagnosis 0 No Ambulatory aid None/bed rest/wheelchair/nurse 0 No Crutches/cane/walker 0 No  Furniture 0 No IV Access/Saline Lock 0 No Gait/Training Normal/bed rest/immobile 0 No Weak 0 No Impaired 0 No Mental Status Oriented to own ability 0 Yes Electronic Signature(s) Signed: 04/06/2016 4:38:27 PM By: Alric Quan Entered By: Alric Quan on 04/06/2016 09:49:30 Pelle, Renly A. ([id number]) -------------------------------------------------------------------------------- Foot Assessment Details Patient Name: Kim Pandy A. Date of Service: 04/06/2016 9:45 AM Medical Record Number: [id number] Patient Account Number: [id number] Date of Birth/Sex: 1965-12-15 (51 y.o. Female) Treating RN: Carolyne Fiscal, Debi Primary Care Aiyanah Kalama: Vic Blackbird Other Clinician: Referring Aaliah Jorgenson: Vic Blackbird Treating Rin Gorton/Extender: Frann Rider in Treatment: 0 Foot Assessment Items Site Locations + = Sensation present, - = Sensation absent, C = Callus, U = Ulcer R = Redness, W = Warmth, M = Maceration, PU = Pre-ulcerative lesion F = Fissure, S = Swelling, D = Dryness Assessment Right: Left: Other Deformity: No No Prior Foot Ulcer: No No Prior  Amputation: No No Charcot Joint: No No Ambulatory Status: Ambulatory Without Help Gait: Steady Electronic Signature(s) Signed: 04/06/2016 4:38:27 PM By: Alric Quan Entered By: Alric Quan on 04/06/2016 10:10:07 [name] ([id number]) -------------------------------------------------------------------------------- Nutrition Risk Assessment Details Patient Name: Kim Pandy A. Date of Service: 04/06/2016 9:45 AM Medical Record Number: [id number] Patient Account Number: [id number] Date of Birth/Sex: [birth year] (51 y.o. Female) Treating RN: Carolyne Fiscal, Debi Primary Care Gigi Onstad: Vic Blackbird Other Clinician: Referring Ivy Meriwether: Vic Blackbird Treating Thi Sisemore/Extender: Frann Rider in Treatment: 0 Height (in): 64 Weight (lbs): 152.6 Body Mass Index (BMI): 26.2 Nutrition Risk Assessment Items NUTRITION RISK SCREEN: I have an illness or condition that made me change the kind and/or 0 No amount of food I eat I eat fewer than two meals per day 0 No I eat few fruits and vegetables, or milk products 0 No I have three or more drinks of beer, liquor or wine almost every day 0 No I have tooth or mouth problems that make it hard for me to eat 0 No I don't always have enough money to buy the food I need 0 No I eat alone most of the time 0 No I take three or more different prescribed or over-the-counter drugs a 1 Yes day Without wanting to, I have lost or gained 10 pounds in the last six 0 No months I am not always physically able to shop, cook and/or feed myself 0 No Nutrition Protocols Good Risk Protocol Moderate Risk Protocol Electronic Signature(s) Signed: 04/06/2016 4:38:27 PM By: Alric Quan Entered By: Alric Quan on 04/06/2016 09:49:47

## 2016-04-07 NOTE — Progress Notes (Signed)
[name], Rubio ([id number]) Visit Report for 04/06/2016 Allergy List Details Patient Name: [name], [name]. Date of Service: 04/06/2016 9:45 AM Medical Record Number: [id number] Patient Account Number: [id number] Date of Birth/Sex: February 09, 1966 (51 y.o. Female) Treating RN: [name], [name] Primary Care [name]: [name] Other Clinician: Referring Kamariah Fruchter: [name] Treating [name]/Extender: [name] in Treatment: 0 Allergies Active Allergies doxycycline Allergy Notes Electronic Signature(s) Signed: 04/06/2016 4:38:27 PM By: [name] Entered By: [name] on 04/06/2016 09:42:38 [name] ([id number]) -------------------------------------------------------------------------------- Arrival Information Details Patient Name: [name]. Date of Service: 04/06/2016 9:45 AM Medical Record Number: [id number] Patient Account Number: [id number] Date of Birth/Sex: 1966-02-19 (51 y.o. Female) Treating RN: [name], [name] Primary Care Virgina Deakins: [name] Other Clinician: Referring [name]: [name] Treating [name]/Extender: [name] in Treatment: 0 Visit Information Patient Arrived: Ambulatory Arrival Time: 09:30 Accompanied By: self Transfer Assistance: None Patient Identification Verified: Yes Secondary Verification Process Yes Completed: Patient Requires Transmission-Based No Precautions: Patient Has Alerts: No Electronic Signature(s) Signed: 04/06/2016 4:38:27 PM By: [name] Entered By: [name] on 04/06/2016 09:32:19 [name] ([id number]) -------------------------------------------------------------------------------- Clinic Level of Care Assessment Details Patient Name: [name]. Date of Service: 04/06/2016 9:45 AM Medical Record Number: [id number] Patient Account Number: [id number] Date of Birth/Sex: 06/09/1965 (51 y.o. Female) Treating RN: [name], [name] Primary Care  Montserrat Shek: [name] Other Clinician: Referring [name]: [name] Treating Kaytie Ratcliffe/Extender: [name] in Treatment: 0 Clinic Level of Care Assessment Items TOOL 1 Quantity Score X - Use when EandM and Procedure is performed on INITIAL visit 1 0 ASSESSMENTS - Nursing Assessment / Reassessment X - General Physical Exam (combine w/ comprehensive assessment (listed just 1 20 below) when performed on new pt. evals) X - Comprehensive Assessment (HX, ROS, Risk Assessments, Wounds Hx, etc.) 1 25 ASSESSMENTS - Wound and Skin Assessment / Reassessment []  - Dermatologic / Skin Assessment (not related to wound area) 0 ASSESSMENTS - Ostomy and/or Continence Assessment and Care []  - Incontinence Assessment and Management 0 []  - Ostomy Care Assessment and Management (repouching, etc.) 0 PROCESS - Coordination of Care []  - Simple Patient / Family Education for ongoing care 0 X - Complex (extensive) Patient / Family Education for ongoing care 1 20 X - Staff obtains Programmer, systems, Records, Test Results / Process Orders 1 10 []  - Staff telephones HHA, Nursing Homes / Clarify orders / etc 0 []  - Routine Transfer to another Facility (non-emergent condition) 0 []  - Routine Hospital Admission (non-emergent condition) 0 X - New Admissions / Biomedical engineer / Ordering NPWT, Apligraf, etc. 1 15 []  - Emergency Hospital Admission (emergent condition) 0 PROCESS - Special Needs []  - Pediatric / Minor Patient Management 0 []  - Isolation Patient Management 0 [name]. ([id number]) []  - Hearing / Language / Visual special needs 0 []  - Assessment of Community assistance (transportation, D/C planning, etc.) 0 []  - Additional assistance / Altered mentation 0 []  - Support Surface(s) Assessment (bed, cushion, seat, etc.) 0 INTERVENTIONS - Miscellaneous []  - External ear exam 0 []  - Patient Transfer (multiple staff / Civil Service fast streamer / Similar devices) 0 []  - Simple Staple / Suture  removal (25 or less) 0 []  - Complex Staple / Suture removal (26 or more) 0 []  - Hypo/Hyperglycemic Management (do not check if billed separately) 0 X - Ankle / Brachial Index (ABI) - do not check if billed separately 1 15 Has the patient been seen at the hospital within the last three years: Yes Total Score:  105 Level Of Care: New/Established - Level 3 Electronic Signature(s) Signed: 04/06/2016 4:38:27 PM By: [name] Entered By: [name] on 04/06/2016 11:37:31 [name] ([id number]) -------------------------------------------------------------------------------- Encounter Discharge Information Details Patient Name: [name]. Date of Service: 04/06/2016 9:45 AM Medical Record Number: [id number] Patient Account Number: [id number] Date of Birth/Sex: Mar 15, 1966 (51 y.o. Female) Treating RN: [name], [name] Primary Care Kamdon Reisig: [name] Other Clinician: Referring Olden Klauer: [name] Treating Omaree Fuqua/Extender: [name] in Treatment: 0 Encounter Discharge Information Items Discharge Pain Level: 0 Discharge Condition: Stable Ambulatory Status: Ambulatory Discharge Destination: Home Transportation: Private Auto Accompanied By: self Schedule Follow-up Appointment: Yes Medication Reconciliation completed and provided to Patient/Care No [name]: Provided on Clinical Summary of Care: 04/06/2016 Form Type Recipient Paper Patient RM Electronic Signature(s) Signed: 04/06/2016 10:44:30 AM By: [name] Entered By: [name] on 04/06/2016 10:44:30 [name]. ([id number]) -------------------------------------------------------------------------------- Lower Extremity Assessment Details Patient Name: [name]. Date of Service: 04/06/2016 9:45 AM Medical Record Number: [id number] Patient Account Number: [id number] Date of Birth/Sex: [birth year] (51 y.o. Female) Treating RN: [name], [name] Primary Care Cai Anfinson: [name] Other Clinician: Referring Ryder Chesmore: [name] Treating Kritika Stukes/Extender: [name] in Treatment: 0 [name] Assessment Pulses: Dorsalis Pedis Palpable: [Left:Yes] [Right:Yes] Posterior Tibial Extremity colors, hair growth, and conditions: Extremity Color: [Left:Normal] [Right:Normal] Hair Growth on Extremity: [Left:Yes] [Right:Yes] Temperature of Extremity: [Left:Warm] [Right:Warm] Capillary Refill: [Left:< 3 seconds] [Right:< 3 seconds] Blood Pressure: Brachial: [Left:110] [Right:112] Dorsalis Pedis: 150 [Left:Dorsalis Pedis: 295] Ankle: Posterior Tibial: 142 [Left:Posterior Tibial: 120 1.34] [Right:1.16] Toe Nail Assessment Left: Right: Thick: No No Discolored: No No Deformed: No No Improper Length and Hygiene: No No Electronic Signature(s) Signed: 04/06/2016 4:38:27 PM By: [name] Entered By: [name] on 04/06/2016 10:01:06 [name] ([id number]) -------------------------------------------------------------------------------- Multi Wound Chart Details Patient Name: [name]. Date of Service: 04/06/2016 9:45 AM Medical Record Number: [id number] Patient Account Number: [id number] Date of Birth/Sex: [birth year] (51 y.o. Female) Treating RN: [name], [name] Primary Care Rosine Solecki: [name] Other Clinician: Referring Yonatan Guitron: [name] Treating Raneem Mendolia/Extender: [name] in Treatment: 0 Vital Signs Height(in): 64 Pulse(bpm): 68 Weight(lbs): 152.6 Blood Pressure 105/55 (mmHg): Body Mass Index(BMI): 26 Temperature(F): 97.6 Respiratory Rate 18 (breaths/min): Photos: [1:No Photos] [N/Rubio:N/Rubio] Wound Location: [1:Left Lower Leg - Lateral] [N/Rubio:N/Rubio] Wounding Event: [1:Gradually Appeared] [N/Rubio:N/Rubio] Primary Etiology: [1:To be determined] [N/Rubio:N/Rubio] Comorbid History: [1:Colitis] [N/Rubio:N/Rubio] Date Acquired: [1:02/13/2016] [N/Rubio:N/Rubio] Weeks of Treatment: [1:0] [N/Rubio:N/Rubio] Wound Status: [1:Open]  [N/Rubio:N/Rubio] Measurements L x W x D 1.7x1.3x0.2 [N/Rubio:N/Rubio] (cm) Area (cm) : [1:1.736] [N/Rubio:N/Rubio] Volume (cm) : [1:0.347] [N/Rubio:N/Rubio] Starting Position 1 12 (o'clock): Ending Position 1 [1:12] (o'clock): Maximum Distance 1 0.5 (cm): Undermining: [1:Yes] [N/Rubio:N/Rubio] Classification: [1:Partial Thickness] [N/Rubio:N/Rubio] Exudate Amount: [1:Large] [N/Rubio:N/Rubio] Exudate Type: [1:Serosanguineous] [N/Rubio:N/Rubio] Exudate Color: [1:red, brown] [N/Rubio:N/Rubio] Wound Margin: [1:Flat and Intact] [N/Rubio:N/Rubio] Granulation Amount: [1:None Present (0%)] [N/Rubio:N/Rubio] Necrotic Amount: [1:Large (67-100%)] [N/Rubio:N/Rubio] Necrotic Tissue: [1:Eschar, Adherent Slough] [N/Rubio:N/Rubio] Exposed Structures: [1:Fascia: No Fat Layer (Subcutaneous Tissue) Exposed: No] [N/Rubio:N/Rubio] Tendon: No Muscle: No Joint: No Bone: No Limited to Skin Breakdown Epithelialization: None N/Rubio N/Rubio Debridement: Debridement ([id number] N/Rubio N/Rubio [id number]) Pre-procedure 10:22 N/Rubio N/Rubio Verification/Time Out Taken: Pain Control: Lidocaine 4% Topical N/Rubio N/Rubio Solution Tissue Debrided: Fibrin/Slough, Exudates, N/Rubio N/Rubio Subcutaneous Level: Skin/Subcutaneous N/Rubio N/Rubio Tissue Debridement Area (sq 2.21 N/Rubio N/Rubio cm): Instrument: Curette N/Rubio N/Rubio Bleeding: Minimum N/Rubio N/Rubio Hemostasis Achieved: Pressure N/Rubio N/Rubio Procedural Pain: 0 N/Rubio N/Rubio Post Procedural Pain: 0 N/Rubio N/Rubio Debridement Treatment Procedure was tolerated N/Rubio N/Rubio Response: well Post  Debridement 1.7x1.3x0.2 N/Rubio N/Rubio Measurements L x W x D (cm) Post Debridement 0.347 N/Rubio N/Rubio Volume: (cm) Periwound Skin Texture: No Abnormalities Noted N/Rubio N/Rubio Periwound Skin No Abnormalities Noted N/Rubio N/Rubio Moisture: Periwound Skin Color: Erythema: Yes N/Rubio N/Rubio Erythema Location: Circumferential N/Rubio N/Rubio Temperature: No Abnormality N/Rubio N/Rubio Tenderness on Yes N/Rubio N/Rubio Palpation: Wound Preparation: Ulcer Cleansing: N/Rubio N/Rubio Rinsed/Irrigated with Saline Topical Anesthetic Applied: Other: lidocaine 4% Procedures Performed: Debridement N/Rubio  N/Rubio Gavigan, [name]. ([id number]) Treatment Notes Wound #1 (Left, Lateral Lower Leg) 1. Cleansed with: Clean wound with Normal Saline 2. Anesthetic Topical Lidocaine 4% cream to wound bed prior to debridement 3. Peri-wound Care: Skin Prep 4. Dressing Applied: Medihoney Gel 5. Secondary Dressing Applied Dry [place] Signature(s) Signed: 04/06/2016 11:09:29 AM By: Christin Fudge MD, FACS Entered By: Christin Fudge on 04/06/2016 11:09:28 [name] ([id number]) -------------------------------------------------------------------------------- [place] Details Patient Name: [name]. Date of Service: 04/06/2016 9:45 AM Medical Record Number: [id number] Patient Account Number: [id number] Date of Birth/Sex: Jul 11, 1965 (51 y.o. Female) Treating RN: [name], [name] Primary Care Alexzavier Girardin: [name] Other Clinician: Referring Zaryah Seckel: [name] Treating Trice Aspinall/Extender: [name] in Treatment: 0 Active Inactive ` Nutrition Nursing Diagnoses: Imbalanced nutrition Goals: Patient/caregiver agrees to and verbalizes understanding of need to use nutritional supplements and/or vitamins as prescribed Date Initiated: 04/06/2016 Target Resolution Date: 05/13/2016 Goal Status: Active Interventions: Assess patient nutrition upon admission and as needed per policy Notes: ` Orientation to the Wound Care Program Nursing Diagnoses: Knowledge deficit related to the wound healing center program Goals: Patient/caregiver will verbalize understanding of the [place] Date Initiated: 04/06/2016 Target Resolution Date: 04/25/2016 Goal Status: Active Interventions: Provide education on orientation to the wound center Notes: ` Pain, Acute or Chronic Nursing Diagnoses: Pain Management - Cyclic Acute (Dressing Change Related) Maharaj, [name]. ([id number]) Pain Management - Non-cyclic Acute (Procedural) Pain  Management - Non-cyclic Chronic Pain Pain, acute or chronic: actual or potential Potential alteration in comfort, pain Goals: Patient/caregiver will verbalize adequate pain control between visits Date Initiated: 04/06/2016 Target Resolution Date: 05/13/2016 Goal Status: Active Interventions: Assess comfort goal upon admission Complete pain assessment as per visit requirements Notes: ` Soft Tissue Infection Nursing Diagnoses: Impaired tissue integrity Goals: Patient will remain free of wound infection Date Initiated: 04/06/2016 Target Resolution Date: 05/13/2016 Goal Status: Active Interventions: Assess signs and symptoms of infection every visit Notes: ` Wound/Skin Impairment Nursing Diagnoses: Impaired tissue integrity Goals: Ulcer/skin breakdown will have Rubio volume reduction of 80% by week 12 Date Initiated: 04/06/2016 Target Resolution Date: 05/13/2016 Goal Status: Active Interventions: Assess patient/caregiver ability to perform ulcer/skin care regimen upon admission and as needed Notes: [name], [name] ([id number]) Electronic Signature(s) Signed: 04/06/2016 4:38:27 PM By: [name] Entered By: [name] on 04/06/2016 10:17:34 [name], [name]. ([id number]) -------------------------------------------------------------------------------- Pain Assessment Details Patient Name: [name]. Date of Service: 04/06/2016 9:45 AM Medical Record Number: [id number] Patient Account Number: [id number] Date of Birth/Sex: 04/11/1965 (51 y.o. Female) Treating RN: [name], [name] Primary Care Kenneith Stief: [name] Other Clinician: Referring Aradhana Gin: [name] Treating Aundray Cartlidge/Extender: [name] in Treatment: 0 Active Problems Location of Pain Severity and Description of Pain Patient Has Paino Yes Site Locations Pain Location: Pain in Ulcers With Dressing Change: Yes Duration of the Pain. Constant / Intermittento Constant Rate the  pain. Current Pain Level: 2 Worst Pain Level: 4 Least Pain Level: 1 Character of Pain Describe the Pain: Throbbing Pain Management and Medication Current Pain Management:  Electronic Signature(s) Signed: 04/06/2016 4:38:27 PM By: [name] Entered By: [name] on 04/06/2016 09:32:51 [name] ([id number]) -------------------------------------------------------------------------------- Patient/Caregiver Education Details Patient Name: [name]. Date of Service: 04/06/2016 9:45 AM Medical Record Number: [id number] Patient Account Number: [id number] Date of Birth/Gender: [birth year] (51 y.o. Female) Treating RN: [name], [name] Primary Care Physician: [name] Other Clinician: Referring Physician: Vic Rubio Treating Physician/Extender: [name] in Treatment: 0 Education Assessment Education Provided To: Patient Education Topics Provided Welcome To [facility]: Handouts: Welcome To [facility] Methods: Explain/Verbal Responses: State content correctly Wound/Skin Impairment: Handouts: Other: change dressing as ordered Methods: Demonstration, Explain/Verbal Responses: State content correctly Electronic Signature(s) Signed: 04/06/2016 4:38:27 PM By: [name] Entered By: [name] on 04/06/2016 10:18:39 [name], [name]. ([id number]) -------------------------------------------------------------------------------- Wound Assessment Details Patient Name: [name]. Date of Service: 04/06/2016 9:45 AM Medical Record Number: [id number] Patient Account Number: [id number] Date of Birth/Sex: 11/04/1965 (52 y.o. Female) Treating RN: [name], [name] Primary Care Mikhail Hallenbeck: [name] Other Clinician: Referring Errin Chewning: [name] Treating Nahomy Limburg/Extender: [name] in Treatment: 0 Wound Status Wound Number: 1 Primary Etiology: To be determined Wound Location: Left Lower Leg -  Lateral Wound Status: Open Wounding Event: Gradually Appeared Comorbid History: Colitis Date Acquired: 02/13/2016 Weeks Of Treatment: 0 Clustered Wound: No Photos Photo Uploaded By: [name] on 04/06/2016 11:32:37 Wound Measurements Length: (cm) 1.7 Width: (cm) 1.3 Depth: (cm) 0.2 Area: (cm) 1.736 Volume: (cm) 0.347 % Reduction in Area: % Reduction in Volume: Epithelialization: None Tunneling: No Undermining: Yes Starting Position (o'clock): 12 Ending Position (o'clock): 12 Maximum Distance: (cm) 0.5 Wound Description Classification: Partial Thickness Wound Margin: Flat and Intact Exudate Amount: Large Exudate Type: Serosanguineous Exudate Color: red, brown Foul Odor After Cleansing: No Wound Bed B[place], [name]. ([id number]) Granulation Amount: None Present (0%) Exposed Structure Necrotic Amount: Large (67-100%) Fascia Exposed: No Necrotic Quality: Eschar, Adherent Slough Fat Layer (Subcutaneous Tissue) Exposed: No Tendon Exposed: No Muscle Exposed: No Joint Exposed: No Bone Exposed: No Limited to Skin Breakdown Periwound Skin Texture Texture Color No Abnormalities Noted: No No Abnormalities Noted: No Erythema: Yes Moisture Erythema Location: Circumferential No Abnormalities Noted: No Temperature / Pain Temperature: No Abnormality Tenderness on Palpation: Yes Wound Preparation Ulcer Cleansing: Rinsed/Irrigated with Saline Topical Anesthetic Applied: Other: lidocaine 4%, Treatment Notes Wound #1 (Left, Lateral Lower Leg) 1. Cleansed with: Clean wound with Normal Saline 2. Anesthetic Topical Lidocaine 4% cream to wound bed prior to debridement 3. Peri-wound Care: Skin Prep 4. Dressing Applied: Medihoney Gel 5. Secondary Dressing Applied Dry [employer] Signature(s) Signed: 04/06/2016 4:38:27 PM By: [name] Entered By: [name] on 04/06/2016 10:07:27 [name]  ([id number]) -------------------------------------------------------------------------------- [place] Details Patient Name: [name]. Date of Service: 04/06/2016 9:45 AM Medical Record Number: [id number] Patient Account Number: [id number] Date of Birth/Sex: 04/20/1965 (51 y.o. Female) Treating RN: [name], [name] Primary Care Noor Vidales: [name] Other Clinician: Referring Cordelro Gautreau: [name] Treating Sylver Vantassell/Extender: [name] in Treatment: 0 Vital Signs Time Taken: 09:32 Temperature (F): 97.6 Height (in): 64 Pulse (bpm): 68 Source: Stated Respiratory Rate (breaths/min): 18 Weight (lbs): 152.6 Blood Pressure (mmHg): 105/55 Source: Stated Reference Range: 80 - 120 mg / dl Body Mass Index (BMI): 26.2 Electronic Signature(s) Signed: 04/06/2016 4:38:27 PM By: [name] Entered By: [name] on 04/06/2016 09:36:34

## 2016-04-07 NOTE — Progress Notes (Signed)
JORGINA, BINNING ([id number]) Visit Report for 04/06/2016 Chief Complaint Document Details Patient Name: [name], Kim A. Date of Service: 04/06/2016 9:45 AM Medical Record Number: [id number] Patient Account Number: [id number] Date of Birth/Sex: 1965-11-02 (51 y.o. Female) Treating RN: Carolyne Fiscal, Debi Primary Care Provider: Vic Blackbird Other Clinician: Referring Provider: Vic Blackbird Treating Provider/Extender: Frann Rider in Treatment: 0 Information Obtained from: Patient Chief Complaint Patient presents to the wound care center for a consult due non healing wound the left lower extremity which she's had for over 7 weeks Electronic Signature(s) Signed: 04/06/2016 11:10:04 AM By: Christin Fudge MD, FACS Entered By: Christin Fudge on 04/06/2016 11:10:03 [name] ([id number]) -------------------------------------------------------------------------------- Debridement Details Patient Name: Kim Pandy A. Date of Service: 04/06/2016 9:45 AM Medical Record Number: [id number] Patient Account Number: [id number] Date of Birth/Sex: [birth year] (51 y.o. Female) Treating RN: Carolyne Fiscal, Debi Primary Care Provider: Vic Blackbird Other Clinician: Referring Provider: Vic Blackbird Treating Provider/Extender: Frann Rider in Treatment: 0 Debridement Performed for Wound #1 Left,Lateral Lower Leg Assessment: Performed By: Physician Christin Fudge, MD Debridement: Debridement Pre-procedure Yes - 10:22 Verification/Time Out Taken: Start Time: 10:23 Pain Control: Lidocaine 4% Topical Solution Level: Skin/Subcutaneous Tissue Total Area Debrided (L x 1.7 (cm) x 1.3 (cm) = 2.21 (cm) W): Tissue and other Viable, Non-Viable, Exudate, Fibrin/Slough, Subcutaneous material debrided: Instrument: Curette Bleeding: Minimum Hemostasis Achieved: Pressure End Time: 10:25 Procedural Pain: 0 Post Procedural Pain: 0 Response to Treatment: Procedure was tolerated well Post  Debridement Measurements of Total Wound Length: (cm) 1.7 Width: (cm) 1.3 Depth: (cm) 0.2 Volume: (cm) 0.347 Character of Wound/Ulcer Post Requires Further Debridement Debridement: Severity of Tissue Post Debridement: Fat layer exposed Post Procedure Diagnosis Same as Pre-procedure Electronic Signature(s) Signed: 04/06/2016 11:09:42 AM By: Christin Fudge MD, FACS Signed: 04/06/2016 4:38:27 PM By: Alric Quan Entered By: Christin Fudge on 04/06/2016 11:09:42 [name] ([id number]) [name], Kim A. ([id number]) -------------------------------------------------------------------------------- HPI Details Patient Name: Kim Pandy A. Date of Service: 04/06/2016 9:45 AM Medical Record Number: [id number] Patient Account Number: [id number] Date of Birth/Sex: September 15, 1965 (51 y.o. Female) Treating RN: Primary Care Provider: Vic Blackbird Other Clinician: Referring Provider: Vic Blackbird Treating Provider/Extender: Frann Rider in Treatment: 0 History of Present Illness Location: started off as a pustule on the left lower extremity Quality: Patient reports experiencing a sharp pain to affected area(s). Severity: Patient states wound are getting worse. Duration: Patient has had the wound for > 8 weeks prior to seeking treatment at the wound center Timing: Pain in wound is constant (hurts all the time) Context: The wound appeared gradually over time Modifying Factors: Other treatment(s) tried include:Bactrim and a couple of IandD's Associated Signs and Symptoms: Patient reports having increase discharge. HPI Description: 51 year old patient was seen by her PCP Dr. Buelah Manis, for left lower extremity cellulitis which has not improved over time. she has ulcerative colitis and is on Biologics -- Simponi. She was initially seen in December and a culture was negative and the patient had been started on Bactrim. She had an IandD of an superficial abscess done on December 28 and the  repeat culture was negative. There was a question of whether this could be a spider bite. She is on immunosuppressants for her ulcerative colitis. Her past medical history significant for ulcerative colitis, status post abdominal hernia repair, multiple colonoscopies. She is not a smoker. Electronic Signature(s) Signed: 04/06/2016 11:12:21 AM By: Christin Fudge MD, FACS Previous Signature: 04/06/2016 10:30:24 AM Version By: Christin Fudge MD, FACS Previous Signature: 04/06/2016 10:02:57 AM Version By:  Christin Fudge MD, FACS Previous Signature: 04/06/2016 10:01:58 AM Version By: Christin Fudge MD, FACS Previous Signature: 04/06/2016 10:01:14 AM Version By: Christin Fudge MD, FACS Entered By: Christin Fudge on 04/06/2016 11:12:21 [name] ([id number]) -------------------------------------------------------------------------------- Physical Exam Details Patient Name: Kim Pandy A. Date of Service: 04/06/2016 9:45 AM Medical Record Number: [id number] Patient Account Number: [id number] Date of Birth/Sex: 04-06-1965 (51 y.o. Female) Treating RN: Carolyne Fiscal, Debi Primary Care Provider: Vic Blackbird Other Clinician: Referring Provider: Vic Blackbird Treating Provider/Extender: Frann Rider in Treatment: 0 Constitutional . Pulse regular. Respirations normal and unlabored. Afebrile. . Eyes Nonicteric. Reactive to light. Ears, Nose, Mouth, and Throat Lips, teeth, and gums WNL.Marland Kitchen Moist mucosa without lesions. Neck supple and nontender. No palpable supraclavicular or cervical adenopathy. Normal sized without goiter. Respiratory WNL. No retractions.. Breath sounds WNL, No rubs, rales, rhonchi, or wheeze.. Cardiovascular . Pedal Pulses WNL. ABI on the left was 1.34 in the right was 1.16. no gross lymphedema. Chest Breasts symmetical and no nipple discharge.. Breast tissue WNL, no masses, lumps, or tenderness.. Gastrointestinal (GI) Abdomen without masses or tenderness.. No liver or  spleen enlargement or tenderness.. Lymphatic No adneopathy. No adenopathy. No adenopathy. Musculoskeletal Adexa without tenderness or enlargement.. Digits and nails w/o clubbing, cyanosis, infection, petechiae, ischemia, or inflammatory conditions.. Integumentary (Hair, Skin) No suspicious lesions. No crepitus or fluctuance. No peri-wound warmth or erythema. No masses.Marland Kitchen Psychiatric Judgement and insight Intact.. No evidence of depression, anxiety, or agitation.. Notes she has a painful ulcer on the left anterior lateral shin which has typical appearance of a pyoderma gangrenosum with undermining and tenderness. The cribriform pattern and there is sufficient slough at the base which I have sharply removed with a #3 curet and no cultures or biopsy was taken today Electronic Signature(s) Signed: 04/06/2016 11:14:08 AM By: Christin Fudge MD, FACS Entered By: Christin Fudge on 04/06/2016 11:14:07 [name] ([id number]) [name] ([id number]) -------------------------------------------------------------------------------- Physician Orders Details Patient Name: Kim Pandy A. Date of Service: 04/06/2016 9:45 AM Medical Record Number: [id number] Patient Account Number: [id number] Date of Birth/Sex: Oct 29, 1965 (51 y.o. Female) Treating RN: Carolyne Fiscal, Debi Primary Care Provider: Vic Blackbird Other Clinician: Referring Provider: Vic Blackbird Treating Provider/Extender: Frann Rider in Treatment: 0 Verbal / Phone Orders: Yes Clinician: Pinkerton, Debi Read Back and Verified: Yes Diagnosis Coding Wound Cleansing Wound #1 Left,Lateral Lower Leg o Clean wound with Normal Saline. o Cleanse wound with mild soap and water o May Shower, gently pat wound dry prior to applying new dressing. Anesthetic Wound #1 Left,Lateral Lower Leg o Topical Lidocaine 4% cream applied to wound bed prior to debridement - for clinic use Skin Barriers/Peri-Wound Care Wound #1  Left,Lateral Lower Leg o Skin Prep Primary Wound Dressing Wound #1 Left,Lateral Lower Leg o Medihoney gel Secondary Dressing Wound #1 Left,Lateral Lower Leg o Dry Gauze o Non-adherent pad - telfa island Dressing Change Frequency Wound #1 Left,Lateral Lower Leg o Change dressing every day. Follow-up Appointments Wound #1 Left,Lateral Lower Leg o Return Appointment in 1 week. Additional Orders / Instructions Wound #1 Left,Lateral Lower Leg o Increase protein intake. SUSANNA, BENGE A. ([id number]) Medications-please add to medication list. Wound #1 Left,Lateral Lower Leg o Other: - Vitamin A, Vitamin C, Zinc, Multivitamin Patient Medications Allergies: doxycycline Notifications Medication Indication Start End MediHoney (honey) 04/06/2016 DOSE topical 100 % paste - paste topical as directed Electronic Signature(s) Signed: 04/06/2016 11:15:14 AM By: Christin Fudge MD, FACS Entered By: Christin Fudge on 04/06/2016 11:15:14 [name] ([id number]) -------------------------------------------------------------------------------- Problem List Details Patient  Name: ALYSABETH, SCALIA A. Date of Service: 04/06/2016 9:45 AM Medical Record Number: [id number] Patient Account Number: [id number] Date of Birth/Sex: September 15, 1965 (51 y.o. 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 Date of Service: 04/06/2016 9:45 AM Medical Record Number: [id number] Patient Account Number: [id number] Date of Birth/Sex: [birth year] (51 y.o. Female) Treating RN: Carolyne Fiscal, Debi Primary Care Provider: Vic Blackbird Other Clinician: Referring Provider: Vic Blackbird Treating Provider/Extender: Frann Rider in Treatment: 0 Subjective Chief Complaint Information obtained from Patient Patient presents to the wound care center for a consult due non healing wound the left lower extremity which she's had for over 7 weeks History of Present Illness (HPI) The following HPI elements were documented for the patient's wound: Location: started off as a pustule on the left lower extremity Quality: Patient reports experiencing a sharp pain to affected area(s). Severity: Patient states wound are getting worse. Duration: Patient has had the wound for > 8 weeks prior to seeking treatment at the wound center Timing: Pain in wound is constant (hurts all the time) Context: The wound appeared gradually over time Modifying Factors: Other treatment(s) tried include:Bactrim and a couple of IandD's Associated Signs and Symptoms: Patient reports having increase discharge. 51 year old patient was seen by her PCP Dr. Buelah Manis, for left lower extremity cellulitis which has not improved over time. she has ulcerative colitis and is on Biologics -- Simponi. She was initially seen in December and a culture was negative and the patient had been started on Bactrim. She had an IandD of an superficial abscess done on December 28 and the repeat culture was negative. There was a question of whether this could be a spider bite. She is on immunosuppressants for her ulcerative colitis. Her past medical  history significant for ulcerative colitis, status post abdominal hernia repair, multiple colonoscopies. She is not a smoker. Wound History Patient presents with 1 open wound that has been present for approximately since end of nov 2017. Patient has been treating wound in the following manner: bactrim, dry banadage, abt ointment. Laboratory tests have not been performed in the last month. Patient reportedly has not tested positive for an antibiotic resistant organism. Patient reportedly has not tested positive for osteomyelitis. Patient reportedly has not had testing performed to evaluate circulation in the legs. Patient experiences the following problems associated with their wounds: infection, swelling. Patient History Information obtained from Patient. Allergies doxycycline [name], Kim A. ([id number]) Family History Cancer - Father, Mother, Lung Disease - Father, No family history of Diabetes, Heart Disease, Hereditary Spherocytosis, Hypertension, Kidney Disease, Seizures, Stroke, Thyroid Problems, Tuberculosis. Social History Never smoker, Marital Status - Divorced, Alcohol Use - Rarely, Drug Use - No History, Caffeine Use - Daily. Medical History Gastrointestinal Patient has history of Colitis - ulcerative Review of Systems (ROS) Constitutional Symptoms (General Health) Complains or has symptoms of Fatigue. Eyes The patient has no complaints or symptoms. Ear/Nose/Mouth/Throat The patient has no complaints or symptoms. Hematologic/Lymphatic The patient has no complaints  or symptoms. Respiratory The patient has no complaints or symptoms. Cardiovascular The patient has no complaints or symptoms. Endocrine The patient has no complaints or symptoms. Genitourinary The patient has no complaints or symptoms. Immunological The patient has no complaints or symptoms. Integumentary (Skin) The patient has no complaints or symptoms. Musculoskeletal degenerative disc  diasease Neurologic The patient has no complaints or symptoms. Oncologic The patient has no complaints or symptoms. Psychiatric The patient has no complaints or symptoms. Medications calcium carbonate norethindrone-e.estradiol-iron [name], Kim A. ([id number]) Probiotic Simponi monthly vitamin D sulfamethoxazole 800 mg-trimethoprim 160 mg tablet oral tablet oral Lialda 1.2 gram tablet,delayed release oral 4 4 tablet,delayed release (DR/EC) oral ; one time monthly Pepcid AC 10 mg tablet oral tablet oral as needed ibuprofen 200 mg tablet oral tablet oral as needed Zoloft 50 mg tablet oral tablet oral MediHoney (honey) 100 % topical paste topical paste topical as directed Objective Constitutional Pulse regular. Respirations normal and unlabored. Afebrile. Vitals Time Taken: 9:32 AM, Height: 64 in, Source: Stated, Weight: 152.6 lbs, Source: Stated, BMI: 26.2, Temperature: 97.6 F, Pulse: 68 bpm, Respiratory Rate: 18 breaths/min, Blood Pressure: 105/55 mmHg. Eyes Nonicteric. Reactive to light. Ears, Nose, Mouth, and Throat Lips, teeth, and gums WNL.Marland Kitchen Moist mucosa without lesions. Neck supple and nontender. No palpable supraclavicular or cervical adenopathy. Normal sized without goiter. Respiratory WNL. No retractions.. Breath sounds WNL, No rubs, rales, rhonchi, or wheeze.. Cardiovascular Pedal Pulses WNL. ABI on the left was 1.34 in the right was 1.16. no gross lymphedema. Chest Breasts symmetical and no nipple discharge.. Breast tissue WNL, no masses, lumps, or tenderness.. Gastrointestinal (GI) Abdomen without masses or tenderness.. No liver or spleen enlargement or tenderness.. Lymphatic No adneopathy. No adenopathy. No adenopathy. Musculoskeletal Adexa without tenderness or enlargement.. Digits and nails w/o clubbing, cyanosis, infection, petechiae, ischemia, or inflammatory conditions.Marland Kitchen [name], Kim A. ([id number]) Psychiatric Judgement and insight Intact.. No evidence  of depression, anxiety, or agitation.. 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 The cribriform pattern and there is sufficient slough at the base which I have sharply removed with a #3 curet and no cultures or biopsy was taken today Integumentary (Hair, Skin) No suspicious lesions. No crepitus or fluctuance. No peri-wound warmth or erythema. No masses.. Wound #1 status is Open. Original cause of wound was Gradually Appeared. The wound is located on the Left,Lateral Lower Leg. The wound measures 1.7cm length x 1.3cm width x 0.2cm depth; 1.736cm^2 area and 0.347cm^3 volume. The wound is limited to skin breakdown. There is no tunneling noted, however, there is undermining starting at 12:00 and ending at 12:00 with a maximum distance of 0.5cm. There is a large amount of serosanguineous drainage noted. The wound margin is flat and intact. There is no granulation within the wound bed. There is a large (67-100%) amount of necrotic tissue within the wound bed including Eschar and Adherent Slough. The periwound skin appearance exhibited: Erythema. The surrounding wound skin color is noted with erythema which is circumferential. Periwound temperature was noted as No Abnormality. The periwound has tenderness on palpation. 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 He will require a biopsy to be done as soon as possible and would benefit from seeing  her dermatologist. He is unable to get this for her I would be happy to do it during her next visit. She will need extensive blood work and appropriate management of this condition I have recommended we treat her with Medihoney on a daily basis, and I will review her back next week. She may need appropriate long-term therapy in conjunction with her gastroenterologist and internal medicine physician. 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 There was a Skin/Subcutaneous Tissue Debridement ([id number]) debridement with total area of 2.21 sq cm performed by Christin Fudge, MD. with the following instrument(s): Curette to remove Viable and Non-Viable tissue/material including Exudate, Fibrin/Slough, and Subcutaneous after achieving pain control using Lidocaine 4% Topical Solution. A time out was conducted at 10:22, prior to the start of the procedure. A Minimum amount of bleeding was controlled with Pressure. The procedure was tolerated well with a pain level of 0 throughout and a pain level of 0 following the procedure. Post Debridement Measurements: 1.7cm length x 1.3cm width x 0.2cm depth; 0.347cm^3 volume. Character of Wound/Ulcer Post Debridement requires further debridement. Severity of Tissue Post Debridement is: Fat layer exposed. Post procedure Diagnosis Wound #1: Same as Pre-Procedure Plan Wound Cleansing: Wound #1 Left,Lateral Lower Leg: Clean wound with Normal Saline. Cleanse wound with mild soap and water May Shower, gently pat wound dry prior to applying new dressing. Anesthetic: Wound #1 Left,Lateral Lower Leg: Topical Lidocaine 4% cream applied to wound bed prior to debridement - for clinic use Skin Barriers/Peri-Wound Care: Wound #1 Left,Lateral Lower Leg: Skin Prep Primary  Wound Dressing: Wound #1 Left,Lateral Lower Leg: Medihoney gel Secondary Dressing: Wound #1 Left,Lateral Lower Leg: Dry Gauze Non-adherent pad - telfa island Dressing Change Frequency: Wound #1 Left,Lateral Lower Leg: Change dressing every day. Follow-up Appointments: Wound #1 Left,Lateral Lower Leg: Return Appointment in 1 week. [name], Kim A. ([id number]) Additional Orders / Instructions: Wound #1 Left,Lateral Lower Leg: Increase protein intake. Medications-please add to medication list.: Wound #1 Left,Lateral Lower Leg: Other: - Vitamin A, Vitamin C, Zinc, Multivitamin The following medication(s) was prescribed: MediHoney (honey) topical 100 % paste paste topical as directed starting 04/06/2016 This 51 year old patient who has a diagnosis of ulcerative colitis and is on Biologics, has had this painful ulcer on the left lower extremity for about 8 weeks now. 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 He will require a biopsy to be done as soon as possible and would benefit from seeing her dermatologist. He is unable to get this for her I would be happy to do it during her next visit. She will need extensive blood work and appropriate management of this condition I have recommended we treat her with Medihoney on a daily basis, and I will review her back next week. She may need appropriate long-term therapy in conjunction with her gastroenterologist and internal medicine physician. 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 Date of Service: 04/06/2016 9:45 AM Medical Record Number: [id number] Patient  Account Number: [id number] Date of Birth/Sex: [birth year] (51 y.o. Female) Treating RN: Carolyne Fiscal, Debi Primary Care Provider: Vic Blackbird Other Clinician: Referring Provider: Vic Blackbird Treating Provider/Extender: Frann Rider in Treatment: 0 Information Obtained From Patient Wound History Do you currently have one or more open woundso Yes How many open wounds do you currently haveo 1 Approximately how long have you had your woundso since end of nov 2017 How have you been treating your wound(s) until nowo bactrim, dry banadage, abt ointment Has your wound(s) ever healed and then re-openedo No Have you had any lab work done in the past montho No Have you tested positive for an antibiotic resistant organism No (MRSA, VRE)o Have you tested positive for osteomyelitis (bone infection)o No Have you had any tests for circulation on your legso No Have you had other problems associated with your woundso Infection, Swelling Constitutional Symptoms (General Health) Complaints and Symptoms: Positive for: Fatigue Eyes Complaints and Symptoms: No Complaints or Symptoms Ear/Nose/Mouth/Throat Complaints and Symptoms: No Complaints or Symptoms Hematologic/Lymphatic Complaints and Symptoms: No Complaints or Symptoms Respiratory Complaints and Symptoms: No Complaints or Symptoms Cardiovascular [name], Kim A. ([id number]) Complaints and Symptoms: No Complaints or Symptoms Gastrointestinal Medical History: Positive for: Colitis - ulcerative Endocrine Complaints and Symptoms: No Complaints or Symptoms Genitourinary Complaints and Symptoms: No Complaints or Symptoms Immunological Complaints and Symptoms: No Complaints or Symptoms Integumentary (Skin) Complaints and Symptoms: No Complaints or Symptoms Musculoskeletal Complaints and Symptoms: Review of System Notes: degenerative disc diasease Neurologic Complaints and Symptoms: No Complaints or  Symptoms Oncologic Complaints and Symptoms: No Complaints or Symptoms Psychiatric Complaints and Symptoms: No Complaints or Symptoms Immunizations Pneumococcal Vaccine: Received Pneumococcal Vaccination: No [name], Kim A. ([id number]) Family and Social History Cancer: Yes - Father, Mother; Diabetes: No; Heart Disease: No; Hereditary Spherocytosis: No; Hypertension: No; Kidney Disease: No; Lung Disease: Yes - Father; Seizures: No; Stroke: No; Thyroid Problems: No; Tuberculosis: No; Never smoker; Marital Status - Divorced; Alcohol Use: Rarely; Drug Use: No History; Caffeine Use: Daily; Financial Concerns: No; Food, Clothing or Shelter Needs: No; Support System Lacking: No; Transportation Concerns: No; Advanced Directives: No; Patient does not want information on Advanced Directives; Do not resuscitate: No; Living Will: No; Medical Power of Attorney: No Physician Affirmation I have reviewed and agree with the above information. Electronic Signature(s) Signed: 04/06/2016 4:24:55 PM By: Christin Fudge MD, FACS Signed: 04/06/2016 4:38:27 PM By: Alric Quan Entered By: Christin Fudge on 04/06/2016 09:54:35 [name], [name] Kitchen ([id number]) -------------------------------------------------------------------------------- SuperBill Details Patient Name: Kim Pandy A. Date of Service: 04/06/2016 Medical Record Number: [id number] Patient Account Number: [id number] Date of Birth/Sex: [birth year] (51 y.o. 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 Ulcerative colitis, unspecified with other complication Y30.1 Combined immunodeficiency, unspecified Facility Procedures CPT4 Code:  60109323 Description: 99213 - WOUND CARE VISIT-LEV 3 EST PT Modifier: Quantity: 1 CPT4 Code: 55732202 Description: 11042 - DEB SUBQ TISSUE 20 SQ CM/< ICD-10 Description Diagnosis L97.222 Non-pressure chronic ulcer of left calf with fat l L88 Pyoderma gangrenosum Modifier: ayer exposed Quantity: 1 Physician Procedures CPT4 Code: 5427062 Description: 37628 - WC PHYS LEVEL 4 - NEW PT ICD-10 Description Diagnosis L97.222 Non-pressure chronic ulcer of left calf with fat l L88 Pyoderma gangrenosum  K51.918 Ulcerative colitis, unspecified with other complic F81.8 Combined immunodeficiency,  unspecified Modifier: 25 ayer exposed ation Quantity: 1 CPT4 Code: 2993716 Fridman, Amyre Description: 96789 - WC PHYS SUBQ TISS 20 SQ CM ICD-10 Description Diagnosis L97.222 Non-pressure chronic ulcer of left calf with fat l L88 Pyoderma gangrenosum A. ([id number]) Modifier: ayer exposed Quantity: 1 Electronic Signature(s) Signed: 04/06/2016 4:24:55 PM By: Christin Fudge MD, FACS Signed: 04/06/2016 4:38:27 PM By: Alric Quan Previous Signature: 04/06/2016 11:18:56 AM Version By: Christin Fudge MD, FACS Entered By: Alric Quan on 04/06/2016 11:37:50

## 2016-04-09 DIAGNOSIS — L88 Pyoderma gangrenosum: Secondary | ICD-10-CM | POA: Diagnosis not present

## 2016-04-09 MED FILL — TRIAMCINOLONE 0.1% CREAM: 0.1 | 30 days supply | Qty: 454 | Fill #0

## 2016-04-09 MED FILL — LIALDA 1.2 GM TABLET SA: 1.2 | 30 days supply | Qty: 120 | Fill #2

## 2016-04-09 MED FILL — SIMPONI 100 MG/ML SOAJ: 100 | 28 days supply | Qty: 1 | Fill #6

## 2016-04-13 ENCOUNTER — Encounter: Payer: 59 | Admitting: Surgery

## 2016-04-13 DIAGNOSIS — L88 Pyoderma gangrenosum: Secondary | ICD-10-CM | POA: Diagnosis not present

## 2016-04-13 DIAGNOSIS — K51918 Ulcerative colitis, unspecified with other complication: Secondary | ICD-10-CM | POA: Diagnosis not present

## 2016-04-13 DIAGNOSIS — D819 Combined immunodeficiency, unspecified: Secondary | ICD-10-CM | POA: Diagnosis not present

## 2016-04-13 DIAGNOSIS — L97222 Non-pressure chronic ulcer of left calf with fat layer exposed: Secondary | ICD-10-CM | POA: Diagnosis not present

## 2016-04-13 DIAGNOSIS — L03116 Cellulitis of left lower limb: Secondary | ICD-10-CM | POA: Diagnosis not present

## 2016-04-14 NOTE — Progress Notes (Signed)
[name] ([id number]) Visit Report for 04/13/2016 Arrival Information Details Patient Name: [name], [name]. Date of Service: 04/13/2016 11:00 AM Medical Record Number: [id number] Patient Account Number: [id number] Date of Birth/Sex: [birth year] (51 y.o. Female) Treating RN: Cornell Barman Primary Care Eileen Kangas: Vic Blackbird Other Clinician: Referring Demont Linford: Vic Blackbird Treating Dajanee Voorheis/Extender: Frann Rider in Treatment: 1 Visit Information History Since Last Visit Added or deleted any medications: No Patient Arrived: Ambulatory Any new allergies or adverse reactions: No Arrival Time: 10:45 Had Rubio fall or experienced change in No Accompanied By: mother activities of daily living that may affect Transfer Assistance: None risk of falls: Patient Identification Verified: Yes Signs or symptoms of abuse/neglect since last No Secondary Verification Process Yes visito Completed: Hospitalized since last visit: No Patient Requires Transmission-Based No Has Dressing in Place as Prescribed: Yes Precautions: Pain Present Now: Yes Patient Has Alerts: No Electronic Signature(s) Signed: 04/13/2016 5:10:05 PM By: Gretta Cool, RN, BSN, Kim RN, BSN Entered By: Gretta Cool, RN, BSN, Kim on 04/13/2016 10:46:14 [name] ([id number]) -------------------------------------------------------------------------------- Clinic Level of Care Assessment Details Patient Name: [name]. Date of Service: 04/13/2016 11:00 AM Medical Record Number: [id number] Patient Account Number: [id number] Date of Birth/Sex: [birth year] (51 y.o. Female) Treating RN: Cornell Barman Primary Care Khamani Fairley: Vic Blackbird Other Clinician: Referring Aldene Hendon: Vic Blackbird Treating Liandro Thelin/Extender: Frann Rider in Treatment: 1 Clinic Level of Care Assessment Items TOOL 4 Quantity Score []  - Use when only an EandM is performed on FOLLOW-UP visit 0 ASSESSMENTS - Nursing Assessment / Reassessment []   - Reassessment of Co-morbidities (includes updates in patient status) 0 X - Reassessment of Adherence to Treatment Plan 1 5 ASSESSMENTS - Wound and Skin Assessment / Reassessment X - Simple Wound Assessment / Reassessment - one wound 1 5 []  - Complex Wound Assessment / Reassessment - multiple wounds 0 []  - Dermatologic / Skin Assessment (not related to wound area) 0 ASSESSMENTS - Focused Assessment []  - Circumferential Edema Measurements - multi extremities 0 []  - Nutritional Assessment / Counseling / Intervention 0 []  - Lower Extremity Assessment (monofilament, tuning fork, pulses) 0 []  - Peripheral Arterial Disease Assessment (using hand held doppler) 0 ASSESSMENTS - Ostomy and/or Continence Assessment and Care []  - Incontinence Assessment and Management 0 []  - Ostomy Care Assessment and Management (repouching, etc.) 0 PROCESS - Coordination of Care X - Simple Patient / Family Education for ongoing care 1 15 []  - Complex (extensive) Patient / Family Education for ongoing care 0 X - Staff obtains Programmer, systems, Records, Test Results / Process Orders 1 10 []  - Staff telephones HHA, Nursing Homes / Clarify orders / etc 0 []  - Routine Transfer to another Facility (non-emergent condition) 0 [name], [name]. ([id number]) []  - Routine Hospital Admission (non-emergent condition) 0 []  - New Admissions / Biomedical engineer / Ordering NPWT, Apligraf, etc. 0 []  - Emergency Hospital Admission (emergent condition) 0 X - Simple Discharge Coordination 1 10 []  - Complex (extensive) Discharge Coordination 0 PROCESS - Special Needs []  - Pediatric / Minor Patient Management 0 []  - Isolation Patient Management 0 []  - Hearing / Language / Visual special needs 0 []  - Assessment of Community assistance (transportation, D/C planning, etc.) 0 []  - Additional assistance / Altered mentation 0 []  - Support Surface(s) Assessment (bed, cushion, seat, etc.) 0 INTERVENTIONS - Wound Cleansing / Measurement X -  Simple Wound Cleansing - one wound 1 5 []  - Complex Wound Cleansing - multiple wounds 0 X - Wound Imaging (photographs - any number of  wounds) 1 5 []  - Wound Tracing (instead of photographs) 0 X - Simple Wound Measurement - one wound 1 5 []  - Complex Wound Measurement - multiple wounds 0 INTERVENTIONS - Wound Dressings X - Small Wound Dressing one or multiple wounds 1 10 []  - Medium Wound Dressing one or multiple wounds 0 []  - Large Wound Dressing one or multiple wounds 0 []  - Application of Medications - topical 0 []  - Application of Medications - injection 0 INTERVENTIONS - Miscellaneous []  - External ear exam 0 [name], [name]. ([id number]) []  - Specimen Collection (cultures, biopsies, blood, body fluids, etc.) 0 []  - Specimen(s) / Culture(s) sent or taken to Lab for analysis 0 []  - Patient Transfer (multiple staff / Harrel Lemon Lift / Similar devices) 0 []  - Simple Staple / Suture removal (25 or less) 0 []  - Complex Staple / Suture removal (26 or more) 0 []  - Hypo / Hyperglycemic Management (close monitor of Blood Glucose) 0 []  - Ankle / Brachial Index (ABI) - do not check if billed separately 0 X - Vital Signs 1 5 Has the patient been seen at the hospital within the last three years: Yes Total Score: 75 Level Of Care: New/Established - Level 2 Electronic Signature(s) Signed: 04/13/2016 5:10:05 PM By: Gretta Cool, RN, BSN, Kim RN, BSN Entered By: Gretta Cool, RN, BSN, Kim on 04/13/2016 11:07:13 [name] ([id number]) -------------------------------------------------------------------------------- Encounter Discharge Information Details Patient Name: [name]. Date of Service: 04/13/2016 11:00 AM Medical Record Number: [id number] Patient Account Number: [id number] Date of Birth/Sex: August 15, 1965 (51 y.o. Female) Treating RN: Carolyne Fiscal, Debi Primary Care Bryton Romagnoli: Vic Blackbird Other Clinician: Referring Darra Rosa: Vic Blackbird Treating Tynisa Vohs/Extender: Frann Rider in  Treatment: 1 Encounter Discharge Information Items Discharge Pain Level: 0 Discharge Condition: Stable Ambulatory Status: Ambulatory Discharge Destination: Home Transportation: Private Auto Accompanied By: mother Schedule Follow-up Appointment: Yes Medication Reconciliation completed and provided to Patient/Care Yes Liah Morr: Provided on Clinical Summary of Care: 04/13/2016 Form Type Recipient Paper Patient RM Electronic Signature(s) Signed: 04/13/2016 5:10:05 PM By: Gretta Cool RN, BSN, Kim RN, BSN Previous Signature: 04/13/2016 11:07:15 AM Version By: Ruthine Dose Entered By: Gretta Cool RN, BSN, Kim on 04/13/2016 11:07:54 [name] ([id number]) -------------------------------------------------------------------------------- Lower Extremity Assessment Details Patient Name: [name]. Date of Service: 04/13/2016 11:00 AM Medical Record Number: [id number] Patient Account Number: [id number] Date of Birth/Sex: [birth year] (52 y.o. Female) Treating RN: Cornell Barman Primary Care Larosa Rhines: Vic Blackbird Other Clinician: Referring Johari Pinney: Vic Blackbird Treating Wilkin Lippy/Extender: Frann Rider in Treatment: 1 Edema Assessment Assessed: [Left: No] [Right: No] Edema: [Left: N] [Right: o] Vascular Assessment Claudication: Claudication Assessment [Left:None] Pulses: Dorsalis Pedis Palpable: [Left:Yes] Posterior Tibial Palpable: [Left:Yes] Extremity colors, hair growth, and conditions: Extremity Color: [Left:Normal] Hair Growth on Extremity: [Left:Yes] Temperature of Extremity: [Left:Warm] Capillary Refill: [Left:< 3 seconds] Dependent Rubor: [Left:No] Blanched when Elevated: [Left:No] Lipodermatosclerosis: [Left:No] Toe Nail Assessment Left: Right: Thick: No Discolored: No Deformed: No Improper Length and Hygiene: No Electronic Signature(s) Signed: 04/13/2016 5:10:05 PM By: Gretta Cool, RN, BSN, Kim RN, BSN Entered By: Gretta Cool, RN, BSN, Kim on 04/13/2016  10:52:44 [name] ([id number]) -------------------------------------------------------------------------------- Multi Wound Chart Details Patient Name: [name]. Date of Service: 04/13/2016 11:00 AM Medical Record Number: [id number] Patient Account Number: [id number] Date of Birth/Sex: [birth year] (51 y.o. Female) Treating RN: Cornell Barman Primary Care Avyonna Wagoner: Vic Blackbird Other Clinician: Referring Vern Guerette: Vic Blackbird Treating Shanin Szymanowski/Extender: Frann Rider in Treatment: 1 Vital Signs Height(in): 64 Pulse(bpm): 66 Weight(lbs): 152.6 Blood Pressure 150/63 (mmHg): Body Mass Index(BMI):  26 Temperature(F): 98.1 Respiratory Rate 16 (breaths/min): Photos: [1:No Photos] [N/Rubio:N/Rubio] Wound Location: [1:Left Lower Leg - Lateral] [N/Rubio:N/Rubio] Wounding Event: [1:Gradually Appeared] [N/Rubio:N/Rubio] Primary Etiology: [1:To be determined] [N/Rubio:N/Rubio] Comorbid History: [1:Colitis] [N/Rubio:N/Rubio] Date Acquired: [1:02/13/2016] [N/Rubio:N/Rubio] Weeks of Treatment: [1:1] [N/Rubio:N/Rubio] Wound Status: [1:Open] [N/Rubio:N/Rubio] Measurements L x W x D 2x1x0.2 [N/Rubio:N/Rubio] (cm) Area (cm) : [1:1.571] [N/Rubio:N/Rubio] Volume (cm) : [1:0.314] [N/Rubio:N/Rubio] % Reduction in Area: [1:9.50%] [N/Rubio:N/Rubio] % Reduction in Volume: 9.50% [N/Rubio:N/Rubio] Classification: [1:Partial Thickness] [N/Rubio:N/Rubio] Exudate Amount: [1:Large] [N/Rubio:N/Rubio] Exudate Type: [1:Serosanguineous] [N/Rubio:N/Rubio] Exudate Color: [1:red, brown] [N/Rubio:N/Rubio] Wound Margin: [1:Flat and Intact] [N/Rubio:N/Rubio] Granulation Amount: [1:None Present (0%)] [N/Rubio:N/Rubio] Necrotic Amount: [1:Large (67-100%)] [N/Rubio:N/Rubio] Necrotic Tissue: [1:Eschar, Adherent Slough] [N/Rubio:N/Rubio] Exposed Structures: [1:Fat Layer (Subcutaneous Tissue) Exposed: Yes Fascia: No Tendon: No Muscle: No Joint: No Bone: No] [N/Rubio:N/Rubio] Epithelialization: [1:None] [N/Rubio:N/Rubio] Periwound Skin Texture: No Abnormalities Noted N/Rubio N/Rubio Periwound Skin Dry/Scaly: Yes N/Rubio N/Rubio Moisture: Periwound Skin Color: Erythema: Yes N/Rubio  N/Rubio Erythema Location: Circumferential N/Rubio N/Rubio Temperature: No Abnormality N/Rubio N/Rubio Tenderness on Yes N/Rubio N/Rubio Palpation: Wound Preparation: Ulcer Cleansing: N/Rubio N/Rubio Rinsed/Irrigated with Saline Topical Anesthetic Applied: Other: lidocaine 4% Treatment Notes Electronic Signature(s) Signed: 04/13/2016 11:06:41 AM By: Christin Fudge MD, FACS Entered By: Christin Fudge on 04/13/2016 11:06:41 [name] ([id number]) -------------------------------------------------------------------------------- [place] Details Patient Name: [name]. Date of Service: 04/13/2016 11:00 AM Medical Record Number: [id number] Patient Account Number: [id number] Date of Birth/Sex: July 05, 1965 (51 y.o. Female) Treating RN: Cornell Barman Primary Care Tierria Watson: Vic Blackbird Other Clinician: Referring Saleema Weppler: Vic Blackbird Treating Euleta Belson/Extender: Frann Rider in Treatment: 1 Active Inactive ` Nutrition Nursing Diagnoses: Imbalanced nutrition Goals: Patient/caregiver agrees to and verbalizes understanding of need to use nutritional supplements and/or vitamins as prescribed Date Initiated: 04/06/2016 Target Resolution Date: 05/13/2016 Goal Status: Active Interventions: Assess patient nutrition upon admission and as needed per policy Notes: ` Orientation to the Wound Care Program Nursing Diagnoses: Knowledge deficit related to the wound healing center program Goals: Patient/caregiver will verbalize understanding of the [place] Date Initiated: 04/06/2016 Target Resolution Date: 04/25/2016 Goal Status: Active Interventions: Provide education on orientation to the wound center Notes: ` Pain, Acute or Chronic Nursing Diagnoses: Pain Management - Cyclic Acute (Dressing Change Related) [name], [name]. ([id number]) Pain Management - Non-cyclic Acute (Procedural) Pain Management - Non-cyclic Chronic Pain Pain, acute or chronic: actual or  potential Potential alteration in comfort, pain Goals: Patient/caregiver will verbalize adequate pain control between visits Date Initiated: 04/06/2016 Target Resolution Date: 05/13/2016 Goal Status: Active Interventions: Assess comfort goal upon admission Complete pain assessment as per visit requirements Notes: ` Soft Tissue Infection Nursing Diagnoses: Impaired tissue integrity Goals: Patient will remain free of wound infection Date Initiated: 04/06/2016 Target Resolution Date: 05/13/2016 Goal Status: Active Interventions: Assess signs and symptoms of infection every visit Notes: ` Wound/Skin Impairment Nursing Diagnoses: Impaired tissue integrity Goals: Ulcer/skin breakdown will have Rubio volume reduction of 80% by week 12 Date Initiated: 04/06/2016 Target Resolution Date: 05/13/2016 Goal Status: Active Interventions: Assess patient/caregiver ability to perform ulcer/skin care regimen upon admission and as needed Notes: [name], [name] ([id number]) Electronic Signature(s) Signed: 04/13/2016 5:10:05 PM By: Gretta Cool, RN, BSN, Kim RN, BSN Entered By: Gretta Cool, RN, BSN, Kim on 04/13/2016 10:52:54 [name] ([id number]) -------------------------------------------------------------------------------- Pain Assessment Details Patient Name: [name]. Date of Service: 04/13/2016 11:00 AM Medical Record Number: [id number] Patient Account Number: [id number] Date of Birth/Sex: 1965/08/04 (51 y.o. Female) Treating RN: Cornell Barman Primary Care Jeris Easterly: Vic Blackbird Other Clinician: Referring  Tremar Wickens: Vic Blackbird Treating Jimmie Dattilio/Extender: Frann Rider in Treatment: 1 Active Problems Location of Pain Severity and Description of Pain Patient Has Paino Yes Site Locations Pain Location: Generalized Pain, Pain in Ulcers With Dressing Change: Yes Duration of the Pain. Constant / Intermittento Intermittent Character of Pain Describe the Pain: Other: stinging Pain  Management and Medication Current Pain Management: Goals for Pain Management Topical or injectable lidocaine is offered to patient for acute pain when surgical debridement is performed. If needed, Patient is instructed to use over the counter pain medication for the following 24-48 hours after debridement. Wound care MDs do not prescribed pain medications. Patient has chronic pain or uncontrolled pain. Patient has been instructed to make an appointment with their Primary Care Physician for pain management. Electronic Signature(s) Signed: 04/13/2016 5:10:05 PM By: Gretta Cool, RN, BSN, Kim RN, BSN Entered By: Gretta Cool, RN, BSN, Kim on 04/13/2016 10:46:50 [name] ([id number]) -------------------------------------------------------------------------------- Patient/Caregiver Education Details Patient Name: [name]. Date of Service: 04/13/2016 11:00 AM Medical Record Number: [id number] Patient Account Number: [id number] Date of Birth/Gender: 02/04/1966 (52 y.o. Female) Treating RN: Cornell Barman Primary Care Physician: Vic Blackbird Other Clinician: Referring Physician: Vic Blackbird Treating Physician/Extender: Frann Rider in Treatment: 1 Education Assessment Education Provided To: Patient Education Topics Provided Wound/Skin Impairment: Handouts: Caring for Your Ulcer, Other: continue wound care as prescribed Methods: Demonstration Responses: State content correctly Electronic Signature(s) Signed: 04/13/2016 5:10:05 PM By: Gretta Cool, RN, BSN, Kim RN, BSN Entered By: Gretta Cool, RN, BSN, Kim on 04/13/2016 11:08:12 [name] ([id number]) -------------------------------------------------------------------------------- Wound Assessment Details Patient Name: [name]. Date of Service: 04/13/2016 11:00 AM Medical Record Number: [id number] Patient Account Number: [id number] Date of Birth/Sex: [birth year] (51 y.o. Female) Treating RN: Cornell Barman Primary Care Ashlee Player:  Vic Blackbird Other Clinician: Referring Zamyah Wiesman: Vic Blackbird Treating Alizay Bronkema/Extender: Frann Rider in Treatment: 1 Wound Status Wound Number: 1 Primary Etiology: To be determined Wound Location: Left Lower Leg - Lateral Wound Status: Open Wounding Event: Gradually Appeared Comorbid History: Colitis Date Acquired: 02/13/2016 Weeks Of Treatment: 1 Clustered Wound: No Photos Photo Uploaded By: Alric Quan on 04/13/2016 13:11:02 Wound Measurements Length: (cm) 2 Width: (cm) 1 Depth: (cm) 0.2 Area: (cm) 1.571 Volume: (cm) 0.314 % Reduction in Area: 9.5% % Reduction in Volume: 9.5% Epithelialization: None Tunneling: No Undermining: No Wound Description Classification: Partial Thickness Wound Margin: Flat and Intact Exudate Amount: Large Exudate Type: Serosanguineous Exudate Color: red, brown Foul Odor After Cleansing: No Wound Bed Granulation Amount: None Present (0%) Exposed Structure Necrotic Amount: Large (67-100%) Fascia Exposed: No Necrotic Quality: Eschar, Adherent Slough Fat Layer (Subcutaneous Tissue) Exposed: Yes Tendon Exposed: No [name], [name]. ([id number]) Muscle Exposed: No Joint Exposed: No Bone Exposed: No Periwound Skin Texture Texture Color No Abnormalities Noted: No No Abnormalities Noted: No Erythema: Yes Moisture Erythema Location: Circumferential No Abnormalities Noted: No Dry / Scaly: Yes Temperature / Pain Temperature: No Abnormality Tenderness on Palpation: Yes Wound Preparation Ulcer Cleansing: Rinsed/Irrigated with Saline Topical Anesthetic Applied: Other: lidocaine 4%, Treatment Notes Wound #1 (Left, Lateral Lower Leg) 1. Cleansed with: Clean wound with Normal Saline 2. Anesthetic Topical Lidocaine 4% cream to wound bed prior to debridement 4. Dressing Applied: Medihoney Gel 5. Secondary [place] Signature(s) Signed: 04/13/2016 5:10:05 PM By: Gretta Cool, RN, BSN, Kim RN,  BSN Entered By: Gretta Cool, RN, BSN, Kim on 04/13/2016 10:51:04 [name] ([id number]) -------------------------------------------------------------------------------- Vitals Details Patient Name: [name]. Date of Service: 04/13/2016 11:00 AM Medical Record Number: [id number]  Patient Account Number: [id number] Date of Birth/Sex: April 07, 1965 (51 y.o. Female) Treating RN: Cornell Barman Primary Care Lilyanne Mcquown: Vic Blackbird Other Clinician: Referring Cabella Kimm: Vic Blackbird Treating Leodan Bolyard/Extender: Frann Rider in Treatment: 1 Vital Signs Time Taken: 10:47 Temperature (F): 98.1 Height (in): 64 Pulse (bpm): 66 Weight (lbs): 152.6 Respiratory Rate (breaths/min): 16 Body Mass Index (BMI): 26.2 Blood Pressure (mmHg): 150/63 Reference Range: 80 - 120 mg / dl Electronic Signature(s) Signed: 04/13/2016 5:10:05 PM By: Gretta Cool, RN, BSN, Kim RN, BSN Entered By: Gretta Cool, RN, BSN, Kim on 04/13/2016 10:47:39

## 2016-04-14 NOTE — Progress Notes (Signed)
[name], [name] ([id number]) Visit Report for 04/13/2016 Chief Complaint Document Details Patient Name: [name], [name] Date of Service: 04/13/2016 11:00 AM Medical Record Number: [id number] Patient Account Number: [id number] Date of Birth/Sex: 21-Feb-1966 (51 y.o. Female) Treating RN: Carolyne Fiscal, Debi Primary Care Provider: Vic Blackbird Other Clinician: Referring Provider: Vic Blackbird Treating Provider/Extender: Frann Rider in Treatment: 1 Information Obtained from: Patient Chief Complaint Patient presents to the wound care center for a consult due non healing wound the left lower extremity which she's had for over 7 weeks Electronic Signature(s) Signed: 04/13/2016 11:06:50 AM By: Christin Fudge MD, FACS Entered By: Christin Fudge on 04/13/2016 11:06:50 [name] ([id number]) -------------------------------------------------------------------------------- HPI Details Patient Name: Kim Pandy A. Date of Service: 04/13/2016 11:00 AM Medical Record Number: [id number] Patient Account Number: [id number] Date of Birth/Sex: [birth year] (51 y.o. Female) Treating RN: Carolyne Fiscal, Debi Primary Care Provider: Vic Blackbird Other Clinician: Referring Provider: Vic Blackbird Treating Provider/Extender: Frann Rider in Treatment: 1 History of Present Illness Location: started off as a pustule on the left lower extremity Quality: Patient reports experiencing a sharp pain to affected area(s). Severity: Patient states wound are getting worse. Duration: Patient has had the wound for > 8 weeks prior to seeking treatment at the wound center Timing: Pain in wound is constant (hurts all the time) Context: The wound appeared gradually over time Modifying Factors: Other treatment(s) tried include:Bactrim and a couple of IandD's Associated Signs and Symptoms: Patient reports having increase discharge. HPI Description: 51 year old patient was seen by her PCP Dr. Buelah Manis, for left  lower extremity cellulitis which has not improved over time. She has ulcerative colitis and is on Biologics -- Simponi. She was initially seen in December and a culture was negative and the patient had been started on Bactrim. She had an IandD of an superficial abscess done on December 28 and the repeat culture was negative. There was a question of whether this could be a spider bite. She is on immunosuppressants for her ulcerative colitis. Her past medical history significant for ulcerative colitis, status post abdominal hernia repair, multiple colonoscopies. She is not a smoker. Electronic Signature(s) Signed: 04/13/2016 11:07:20 AM By: Christin Fudge MD, FACS Entered By: Christin Fudge on 04/13/2016 11:07:20 [name] ([id number]) -------------------------------------------------------------------------------- Physical Exam Details Patient Name: Kim Pandy A. Date of Service: 04/13/2016 11:00 AM Medical Record Number: [id number] Patient Account Number: [id number] Date of Birth/Sex: [birth year] (51 y.o. Female) Treating RN: Carolyne Fiscal, Debi Primary Care Provider: Vic Blackbird Other Clinician: Referring Provider: Vic Blackbird Treating Provider/Extender: Frann Rider in Treatment: 1 Constitutional . Pulse regular. Respirations normal and unlabored. Afebrile. . Eyes Nonicteric. Reactive to light. Ears, Nose, Mouth, and Throat Lips, teeth, and gums WNL.Marland Kitchen Moist mucosa without lesions. Neck supple and nontender. No palpable supraclavicular or cervical adenopathy. Normal sized without goiter. Respiratory WNL. No retractions.. Breath sounds WNL, No rubs, rales, rhonchi, or wheeze.. Cardiovascular Heart rhythm and rate regular, no murmur or gallop.. Pedal Pulses WNL. No clubbing, cyanosis or edema. Chest Breasts symmetical and no nipple discharge.. Breast tissue WNL, no masses, lumps, or tenderness.. Lymphatic No adneopathy. No adenopathy. No  adenopathy. Musculoskeletal Adexa without tenderness or enlargement.. Digits and nails w/o clubbing, cyanosis, infection, petechiae, ischemia, or inflammatory conditions.. Integumentary (Hair, Skin) No suspicious lesions. No crepitus or fluctuance. No peri-wound warmth or erythema. No masses.Marland Kitchen Psychiatric Judgement and insight Intact.. No evidence of depression, anxiety, or agitation.. Notes the wound looks about the same and biopsies are been recently taken and silver nitrate has been  used which shows appropriate changes to the ulcer base. No sharp debridement was required today. Electronic Signature(s) Signed: 04/13/2016 11:08:03 AM By: Christin Fudge MD, FACS Entered By: Christin Fudge on 04/13/2016 11:08:02 [name] ([id number]) -------------------------------------------------------------------------------- Physician Orders Details Patient Name: Kim Pandy A. Date of Service: 04/13/2016 11:00 AM Medical Record Number: [id number] Patient Account Number: [id number] Date of Birth/Sex: 04-26-1965 (51 y.o. Female) Treating RN: Cornell Barman Primary Care Provider: Vic Blackbird Other Clinician: Referring Provider: Vic Blackbird Treating Provider/Extender: Frann Rider in Treatment: 1 Verbal / Phone Orders: No Diagnosis Coding Wound Cleansing Wound #1 Left,Lateral Lower Leg o Clean wound with Normal Saline. o Cleanse wound with mild soap and water o May Shower, gently pat wound dry prior to applying new dressing. Anesthetic Wound #1 Left,Lateral Lower Leg o Topical Lidocaine 4% cream applied to wound bed prior to debridement - for clinic use Skin Barriers/Peri-Wound Care Wound #1 Left,Lateral Lower Leg o Skin Prep Primary Wound Dressing Wound #1 Left,Lateral Lower Leg o Medihoney gel Secondary Dressing Wound #1 Left,Lateral Lower Leg o Dry Gauze o Non-adherent pad - telfa island Dressing Change Frequency Wound #1 Left,Lateral Lower Leg o  Change dressing every day. Follow-up Appointments Wound #1 Left,Lateral Lower Leg o Return Appointment in 1 week. Additional Orders / Instructions Wound #1 Left,Lateral Lower Leg o Increase protein intake. [name], [name] ([id number]) Medications-please add to medication list. Wound #1 Left,Lateral Lower Leg o Other: - Vitamin A, Vitamin C, Zinc, Multivitamin Electronic Signature(s) Signed: 04/13/2016 4:18:36 PM By: Christin Fudge MD, FACS Signed: 04/13/2016 5:10:05 PM By: Gretta Cool RN, BSN, Kim RN, BSN Entered By: Gretta Cool, RN, BSN, Kim on 04/13/2016 11:06:42 [name] ([id number]) -------------------------------------------------------------------------------- Problem List Details Patient Name: Kim Pandy A. Date of Service: 04/13/2016 11:00 AM Medical Record Number: [id number] Patient Account Number: [id number] Date of Birth/Sex: Oct 29, 1965 (51 y.o. 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 Date of Service: 04/13/2016 11:00 AM Medical Record Number: [id number] Patient Account Number: [id number] Date of Birth/Sex: 1965/11/06 (51 y.o. Female) Treating RN: Carolyne Fiscal,  Debi Primary Care Provider: Vic Blackbird Other Clinician: Referring Provider: Vic Blackbird Treating Provider/Extender: Frann Rider in Treatment: 1 Subjective Chief Complaint Information obtained from Patient Patient presents to the wound care center for a consult due non healing wound the left lower extremity which she's had for over 7 weeks History of Present Illness (HPI) The following HPI elements were documented for the patient's wound: Location: started off as a pustule on the left lower extremity Quality: Patient reports experiencing a sharp pain to affected area(s). Severity: Patient states wound are getting worse. Duration: Patient has had the wound for > 8 weeks prior to seeking treatment at the wound center Timing: Pain in wound is constant (hurts all the time) Context: The wound appeared gradually over time Modifying Factors: Other treatment(s) tried include:Bactrim and a couple of IandD's Associated Signs and Symptoms: Patient reports having increase discharge. 51 year old patient was seen by her PCP  Dr. Buelah Manis, for left lower extremity cellulitis which has not improved over time. She has ulcerative colitis and is on Biologics -- Simponi. She was initially seen in December and a culture was negative and the patient had been started on Bactrim. She had an IandD of an superficial abscess done on December 28 and the repeat culture was negative. There was a question of whether this could be a spider bite. She is on immunosuppressants for her ulcerative colitis. Her past medical history significant for ulcerative colitis, status post abdominal hernia repair, multiple colonoscopies. She is not a smoker. Objective Constitutional Pulse regular. Respirations normal and unlabored. Afebrile. Vitals Time Taken: 10:47 AM, Height: 64 in, Weight: 152.6 lbs, BMI: 26.2, Temperature: 98.1 F, Pulse: 66 bpm, Respiratory Rate: 16 breaths/min, Blood Pressure: 150/63  mmHg. Rolfson, Kemper A. ([id number]) Eyes Nonicteric. Reactive to light. Ears, Nose, Mouth, and Throat Lips, teeth, and gums WNL.Marland Kitchen Moist mucosa without lesions. Neck supple and nontender. No palpable supraclavicular or cervical adenopathy. Normal sized without goiter. Respiratory WNL. No retractions.. Breath sounds WNL, No rubs, rales, rhonchi, or wheeze.. Cardiovascular Heart rhythm and rate regular, no murmur or gallop.. Pedal Pulses WNL. No clubbing, cyanosis or edema. Chest Breasts symmetical and no nipple discharge.. Breast tissue WNL, no masses, lumps, or tenderness.. Lymphatic No adneopathy. No adenopathy. No adenopathy. Musculoskeletal Adexa without tenderness or enlargement.. Digits and nails w/o clubbing, cyanosis, infection, petechiae, ischemia, or inflammatory conditions.Marland Kitchen Psychiatric Judgement and insight Intact.. No evidence of depression, anxiety, or agitation.. General Notes: the wound looks about the same and biopsies are been recently taken and silver nitrate has been used which shows appropriate changes to the ulcer base. No sharp debridement was required today. Integumentary (Hair, Skin) No suspicious lesions. No crepitus or fluctuance. No peri-wound warmth or erythema. No masses.. Wound #1 status is Open. Original cause of wound was Gradually Appeared. The wound is located on the Left,Lateral Lower Leg. The wound measures 2cm length x 1cm width x 0.2cm depth; 1.571cm^2 area and 0.314cm^3 volume. There is Fat Layer (Subcutaneous Tissue) Exposed exposed. There is no tunneling or undermining noted. There is a large amount of serosanguineous drainage noted. The wound margin is flat and intact. There is no granulation within the wound bed. There is a large (67-100%) amount of necrotic tissue within the wound bed including Eschar and Adherent Slough. The periwound skin appearance exhibited: Dry/Scaly, Erythema. The surrounding wound skin color is noted with erythema which  is circumferential. Periwound temperature was noted as No Abnormality. The periwound has tenderness on palpation. Assessment Hemmingway, Florence A. 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 Cleanse wound with mild soap and water May Shower, gently pat wound dry prior to applying new dressing. Anesthetic: Wound #1 Left,Lateral Lower Leg: Topical Lidocaine 4% cream applied to wound bed prior to debridement - for clinic use Skin Barriers/Peri-Wound Care: Wound #1 Left,Lateral Lower Leg: Skin Prep Primary Wound Dressing: Wound #1 Left,Lateral Lower Leg: Medihoney gel Secondary Dressing: Wound #1 Left,Lateral Lower Leg: Dry Gauze Non-adherent pad - telfa island Dressing Change Frequency: Wound #1 Left,Lateral Lower Leg: Change dressing every day. Follow-up Appointments: Wound #1 Left,Lateral Lower Leg: Return Appointment in 1 week. Additional Orders / Instructions: Wound #1 Left,Lateral Lower Leg: Increase protein intake. Medications-please add to medication list.: Wound #  1 Left,Lateral Lower Leg: Other: - Vitamin A, Vitamin C, Zinc, Multivitamin Giannini, Melvin A. ([id number]) The patient has seen her dermatologist Dr. Elenora Gamma who has done an appropriate biopsy and this pathology report is pending. I have recommended we treat her with Medihoney on a daily basis, and I will review her back next week. She may need appropriate long-term therapy in conjunction with her gastroenterologist and internal medicine physician. 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 Date of Service: 04/13/2016 Medical Record Number: [id number] Patient Account Number: [id number] Date of Birth/Sex: [birth year] (51 y.o. 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 K51.918 Ulcerative colitis, unspecified with other complication V88.6 Combined immunodeficiency, unspecified Facility Procedures CPT4 Code: [id number] Description: [phone] - WOUND CARE VISIT-LEV 2 EST PT Modifier: Quantity: 1 Physician Procedures CPT4 Code: [id number] Description: [id number] - WC PHYS LEVEL 3 - EST PT ICD-10 Description Diagnosis L97.222 Non-pressure chronic ulcer of left calf with fat L88 Pyoderma gangrenosum K51.918 Ulcerative colitis, unspecified with other compli D81.9 Combined immunodeficiency,  unspecified Modifier: layer exposed cation Quantity: 1 Electronic Signature(s) Signed: 04/13/2016 11:09:40 AM By: Christin Fudge MD, FACS Entered By: Christin Fudge on 04/13/2016 11:09:40

## 2016-04-15 MED FILL — SERTRALINE HCL 50 MG TABLET: 50 | 90 days supply | Qty: 90 | Fill #1

## 2016-04-16 ENCOUNTER — Telehealth: Payer: Self-pay | Admitting: Internal Medicine

## 2016-04-16 NOTE — Telephone Encounter (Signed)
Pt states she received a letter from [facility] stating that after March 31 they will no longer cover her Simponi. She was instructed to bring a form to our office for [place] to fill out and that she may need to schedule an appt with her provider to see if there is an alternate drug she can try. Instructed pt to bring [place] the form and we will fill it out for her to stay on Simponi.  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 She developed an ulcer on her leg and this was diagnosed by biopsy. 2% of [facility] patients can develop this condition. She was told treatment is lialda which she is already on and Humira-she has antibodies to humira. Pt was told she needs to see an endocrinologist regarding this condition. Pt would like to get your recommendation regarding one of the [place] endocrinologist. Please advise.

## 2016-04-16 NOTE — Telephone Encounter (Signed)
Form faxed for prior auth for Simponi and pt to check with wound care regarding which specialty she needs to see, dermatology or rheumatology.

## 2016-04-16 NOTE — Telephone Encounter (Signed)
My recommendation is that she remain on Simponi She has antibodies to Humira and Simponi has worked to control her colitis She should also continue Lialda 4.8 g daily We can provide documentation regarding the need for Simponi PG is certainly associated with IBD and is usually managed by biologic such as Simponi and managed by dermatology Endocrinology consultation is less clear to me, would have her confirm with wound care which specialist they feel she needs

## 2016-04-30 ENCOUNTER — Encounter: Payer: 59 | Attending: Surgery | Admitting: Surgery

## 2016-04-30 DIAGNOSIS — S81802A Unspecified open wound, left lower leg, initial encounter: Secondary | ICD-10-CM | POA: Diagnosis not present

## 2016-04-30 DIAGNOSIS — L97222 Non-pressure chronic ulcer of left calf with fat layer exposed: Secondary | ICD-10-CM | POA: Diagnosis not present

## 2016-04-30 DIAGNOSIS — L88 Pyoderma gangrenosum: Secondary | ICD-10-CM | POA: Diagnosis not present

## 2016-04-30 DIAGNOSIS — D819 Combined immunodeficiency, unspecified: Secondary | ICD-10-CM | POA: Diagnosis not present

## 2016-04-30 DIAGNOSIS — K51918 Ulcerative colitis, unspecified with other complication: Secondary | ICD-10-CM | POA: Diagnosis not present

## 2016-04-30 NOTE — Progress Notes (Addendum)
ELIANIS, FISCHBACH ([id number]) Visit Report for 04/30/2016 Chief Complaint Document Details Patient Name: NOEL, HENANDEZ A. Date of Service: 04/30/2016 9:15 AM Medical Record Number: [id number] Patient Account Number: [id number] Date of Birth/Sex: 01/03/1966 (51 y.o. Female) Treating RN: Montey Hora Primary Care Provider: Vic Blackbird Other Clinician: Referring Provider: Vic Blackbird Treating Provider/Extender: Frann Rider in Treatment: 3 Information Obtained from: Patient Chief Complaint Patient presents to the wound care center for a consult due non healing wound the left lower extremity which she's had for over 7 weeks Electronic Signature(s) Signed: 04/30/2016 9:56:24 AM By: Christin Fudge MD, FACS Entered By: Christin Fudge on 04/30/2016 09:56:23 [name] ([id number]) -------------------------------------------------------------------------------- HPI Details Patient Name: [name] Date of Service: 04/30/2016 9:15 AM Medical Record Number: [id number] Patient Account Number: [id number] Date of Birth/Sex: 1965-04-10 (51 y.o. Female) Treating RN: Montey Hora Primary Care Provider: Vic Blackbird Other Clinician: Referring Provider: Vic Blackbird Treating Provider/Extender: Frann Rider in Treatment: 3 History of Present Illness Location: started off as a pustule on the left lower extremity Quality: Patient reports experiencing a sharp pain to affected area(s). Severity: Patient states wound are getting worse. Duration: Patient has had the wound for > 8 weeks prior to seeking treatment at the wound center Timing: Pain in wound is constant (hurts all the time) Context: The wound appeared gradually over time Modifying Factors: Other treatment(s) tried include:Bactrim and a couple of IandD's Associated Signs and Symptoms: Patient reports having increase discharge. HPI Description: 51 year old patient was seen by her PCP Dr. Buelah Manis, for left lower  extremity cellulitis which has not improved over time. She has ulcerative colitis and is on Biologics -- Simponi. She was initially seen in December and a culture was negative and the patient had been started on Bactrim. She had an IandD of an superficial abscess done on December 28 and the repeat culture was negative. There was a question of whether this could be a spider bite. She is on immunosuppressants for her ulcerative colitis. Her past medical history significant for ulcerative colitis, status post abdominal hernia repair, multiple colonoscopies. She is not a smoker. 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 we have discussed this report and she is going to be in touch with a rheumatologist in [place] to look after her care Electronic Signature(s) Signed: 04/30/2016 9:56:48 AM By: Christin Fudge MD, FACS Previous Signature: 04/30/2016 9:24:16 AM Version By: Christin Fudge MD, FACS Entered By: Christin Fudge on 04/30/2016 09:56:48 [name] ([id number]) -------------------------------------------------------------------------------- Physical Exam Details Patient Name: [name] Date of Service: 04/30/2016 9:15 AM Medical Record Number: [id number] Patient Account Number: [id number] Date of Birth/Sex: 1965/09/19 (51 y.o. Female) Treating RN: Montey Hora Primary Care Provider: Vic Blackbird Other Clinician: Referring Provider: Vic Blackbird Treating Provider/Extender: Frann Rider in Treatment: 3 Constitutional . Pulse regular. Respirations normal and unlabored. Afebrile. . Eyes Nonicteric. Reactive to light. Ears, Nose, Mouth, and Throat Lips, teeth, and gums WNL.Marland Kitchen Moist mucosa without lesions. Neck supple and nontender. No palpable supraclavicular or cervical adenopathy. Normal sized without goiter. Respiratory WNL. No retractions.. Cardiovascular Pedal Pulses WNL. No clubbing, cyanosis or  edema. Lymphatic No adneopathy. No adenopathy. No adenopathy. Musculoskeletal Adexa without tenderness or enlargement.. Digits and nails w/o clubbing, cyanosis, infection, petechiae, ischemia, or inflammatory conditions.. Integumentary (Hair, Skin) No suspicious lesions. No crepitus or fluctuance. No peri-wound warmth or erythema. No masses.Marland Kitchen Psychiatric Judgement and insight Intact.. No evidence of depression, anxiety, or agitation.. Notes the  wound is looking much better today with healthy epithelialization and no evidence of any slough. I have not needed to debride this wound sharply today. Electronic Signature(s) Signed: 04/30/2016 9:57:18 AM By: Christin Fudge MD, FACS Entered By: Christin Fudge on 04/30/2016 09:57:17 [name] ([id number]) -------------------------------------------------------------------------------- Physician Orders Details Patient Name: [name] Date of Service: 04/30/2016 9:15 AM Medical Record Number: [id number] Patient Account Number: [id number] Date of Birth/Sex: 1965/07/13 (51 y.o. Female) Treating RN: Montey Hora Primary Care Provider: Vic Blackbird Other Clinician: Referring Provider: Vic Blackbird Treating Provider/Extender: Frann Rider in Treatment: 3 Verbal / Phone Orders: Yes Clinician: Montey Hora Read Back and Verified: Yes Diagnosis Coding Wound Cleansing Wound #1 Left,Lateral Lower Leg o Clean wound with Normal Saline. o Cleanse wound with mild soap and water o May Shower, gently pat wound dry prior to applying new dressing. Anesthetic Wound #1 Left,Lateral Lower Leg o Topical Lidocaine 4% cream applied to wound bed prior to debridement - for clinic use Skin Barriers/Peri-Wound Care Wound #1 Left,Lateral Lower Leg o Skin Prep Primary Wound Dressing Wound #1 Left,Lateral Lower Leg o Medihoney gel Secondary Dressing Wound #1 Left,Lateral Lower Leg o Dry Gauze o Non-adherent pad - telfa  [place] Dressing Change Frequency Wound #1 Left,Lateral Lower Leg o Change dressing every day. Follow-up Appointments Wound #1 Left,Lateral Lower Leg o Return Appointment in 2 weeks. Additional Orders / Instructions Wound #1 Left,Lateral Lower Leg o Increase protein intake. Fishbaugh, Perrie A. ([id number]) Medications-please add to medication list. Wound #1 Left,Lateral Lower Leg o Other: - Vitamin A, Vitamin C, Zinc, Multivitamin Electronic Signature(s) Signed: 04/30/2016 4:07:08 PM By: Christin Fudge MD, FACS Signed: 04/30/2016 5:12:01 PM By: Montey Hora Entered By: Montey Hora on 04/30/2016 09:49:06 Grimm, Cynthia A. ([id number]) -------------------------------------------------------------------------------- Problem List Details Patient Name: [name] Date of Service: 04/30/2016 9:15 AM Medical Record Number: [id number] Patient Account Number: [id number] Date of Birth/Sex: Aug 08, 1965 (51 y.o. 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 Date of Service: 04/30/2016 9:15 AM Medical Record Number: [id number] Patient Account Number: [id number] Date of Birth/Sex:  January 20, 1966 (51 y.o. Female) Treating RN: Montey Hora Primary Care Provider: Vic Blackbird Other Clinician: Referring Provider: Vic Blackbird Treating Provider/Extender: Frann Rider in Treatment: 3 Subjective Chief Complaint Information obtained from Patient Patient presents to the wound care center for a consult due non healing wound the left lower extremity which she's had for over 7 weeks History of Present Illness (HPI) The following HPI elements were documented for the patient's wound: Location: started off as a pustule on the left lower extremity Quality: Patient reports experiencing a sharp pain to affected area(s). Severity: Patient states wound are getting worse. Duration: Patient has had the wound for > 8 weeks prior to seeking treatment at the wound center Timing: Pain in wound is constant (hurts all the time) Context: The wound appeared gradually over time Modifying Factors: Other treatment(s) tried include:Bactrim and a couple of IandD's Associated Signs and Symptoms:  Patient reports having increase discharge. 51 year old patient was seen by her PCP Dr. Buelah Manis, for left lower extremity cellulitis which has not improved over time. She has ulcerative colitis and is on Biologics -- Simponi. She was initially seen in December and a culture was negative and the patient had been started on Bactrim. She had an IandD of an superficial abscess done on December 28 and the repeat culture was negative. There was a question of whether this could be a spider bite. She is on immunosuppressants for her ulcerative colitis. Her past medical history significant for ulcerative colitis, status post abdominal hernia repair, multiple colonoscopies. She is not a smoker. 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 we have discussed this report and she is going to be in touch with a rheumatologist in [place] to look after  her care Objective Constitutional Pulse regular. Respirations normal and unlabored. Afebrile. Hynek, Naidelin A. ([id number]) Vitals Time Taken: 9:32 AM, Height: 64 in, Weight: 152.6 lbs, BMI: 26.2, Temperature: 98.3 F, Pulse: 64 bpm, Respiratory Rate: 16 breaths/min, Blood Pressure: 124/69 mmHg. Eyes Nonicteric. Reactive to light. Ears, Nose, Mouth, and Throat Lips, teeth, and gums WNL.Marland Kitchen Moist mucosa without lesions. Neck supple and nontender. No palpable supraclavicular or cervical adenopathy. Normal sized without goiter. Respiratory WNL. No retractions.. Cardiovascular Pedal Pulses WNL. No clubbing, cyanosis or edema. Lymphatic No adneopathy. No adenopathy. No adenopathy. Musculoskeletal Adexa without tenderness or enlargement.. Digits and nails w/o clubbing, cyanosis, infection, petechiae, ischemia, or inflammatory conditions.Marland Kitchen Psychiatric Judgement and insight Intact.. No evidence of depression, anxiety, or agitation.. General Notes: the wound is looking much better today with healthy epithelialization and no evidence of any slough. I have not needed to debride this wound sharply today. Integumentary (Hair, Skin) No suspicious lesions. No crepitus or fluctuance. No peri-wound warmth or erythema. No masses.. Wound #1 status is Open. Original cause of wound was Gradually Appeared. The wound is located on the Left,Lateral Lower Leg. The wound measures 1cm length x 0.9cm width x 0.1cm depth; 0.707cm^2 area and 0.071cm^3 volume. There is Fat Layer (Subcutaneous Tissue) Exposed exposed. There is no tunneling or undermining noted. There is a large amount of serosanguineous drainage noted. The wound margin is flat and intact. There is large (67-100%) red granulation within the wound bed. There is a small (1-33%) amount of necrotic tissue within the wound bed including Eschar and Adherent Slough. The periwound skin appearance exhibited: Dry/Scaly, Erythema. The surrounding wound skin  color is noted with erythema which is circumferential. Periwound temperature was noted as No Abnormality. The periwound has tenderness on palpation. DARRION, MACAULAY A. ([id number]) Assessment Active Problems ICD-10 [phone] - Non-pressure chronic ulcer of left calf with fat layer exposed L88 - Pyoderma gangrenosum K51.918 - Ulcerative colitis, unspecified with other complication J67.3 - Combined immunodeficiency, unspecified Plan Wound Cleansing: Wound #1 Left,Lateral Lower Leg: Clean wound with Normal Saline. Cleanse wound with mild soap and water May Shower, gently pat wound dry prior to applying new dressing. Anesthetic: Wound #1 Left,Lateral Lower Leg: Topical Lidocaine 4% cream applied to wound bed prior to debridement - for clinic use Skin Barriers/Peri-Wound Care: Wound #1 Left,Lateral Lower Leg: Skin Prep Primary Wound Dressing: Wound #1 Left,Lateral Lower Leg: Medihoney gel Secondary Dressing: Wound #1 Left,Lateral Lower Leg: Dry Gauze Non-adherent pad - telfa [place] Dressing Change Frequency: Wound #1 Left,Lateral Lower Leg: Change dressing every day. Follow-up Appointments: Wound #1 Left,Lateral Lower Leg: Return Appointment in 2 weeks. Additional Orders / Instructions: Wound #  1 Left,Lateral Lower Leg: Increase protein intake. Medications-please add to medication list.: Wound #1 Left,Lateral Lower Leg: Other: - Vitamin A, Vitamin C, Zinc, Multivitamin Geiger, Nessa A. ([id number]) The patient has seen her dermatologist Dr. Elenora Gamma who has done an appropriate biopsy and this pathology report has been reviewed with her. Now that we have confirmed diagnosis of pyoderma gangrenosum I have recommended she follow-up with her rheumatologist who she is awaiting an appointment at [facility]. I would be happy to talk to them if the need arises. I have recommended we treat her with Medihoney on a daily basis, and I will review her back periodically. She may need appropriate  long-term therapy in conjunction with her gastroenterologist and internal medicine physician. Electronic Signature(s) Signed: 04/30/2016 9:58:52 AM By: Christin Fudge MD, FACS Entered By: Christin Fudge on 04/30/2016 09:58:52 [name] ([id number]) -------------------------------------------------------------------------------- SuperBill Details Patient Name: [name] Date of Service: 04/30/2016 Medical Record Number: [id number] Patient Account Number: [id number] Date of Birth/Sex: 1965/03/21 (51 y.o. Female) Treating RN: Montey Hora Primary Care Provider: Vic Blackbird Other Clinician: Referring Provider: Vic Blackbird Treating Provider/Extender: Christin Fudge Service Line: Outpatient Weeks in Treatment: 3 Diagnosis Coding ICD-10 Codes Code Description [phone] Non-pressure chronic ulcer of left calf with fat layer exposed L88 Pyoderma gangrenosum K51.918 Ulcerative colitis, unspecified with other complication J51.8 Combined immunodeficiency, unspecified Facility Procedures CPT4 Code: [id number] Description: 99213 - WOUND CARE VISIT-LEV 3 EST PT Modifier: Quantity: 1 Physician Procedures CPT4 Code: [id number] Description: 12820 - WC PHYS LEVEL 3 - EST PT ICD-10 Description Diagnosis L97.222 Non-pressure chronic ulcer of left calf with fat L88 Pyoderma gangrenosum K51.918 Ulcerative colitis, unspecified with other compli D81.9 Combined immunodeficiency,  unspecified Modifier: layer exposed cation Quantity: 1 Electronic Signature(s) Signed: 04/30/2016 9:59:08 AM By: Christin Fudge MD, FACS Entered By: Christin Fudge on 04/30/2016 09:59:08

## 2016-05-01 NOTE — Progress Notes (Signed)
Kim, Rubio ([id number]) Visit Report for 04/30/2016 Arrival Information Details Patient Name: [name], Kim A. Date of Service: 04/30/2016 9:15 AM Medical Record Number: [id number] Patient Account Number: [id number] Date of Birth/Sex: 05-05-1965 (51 y.o. Female) Treating RN: Montey Hora Primary Care Tikia Skilton: Vic Blackbird Other Clinician: Referring Rhea Thrun: Vic Blackbird Treating Cloee Dunwoody/Extender: Frann Rider in Treatment: 3 Visit Information History Since Last Visit Added or deleted any medications: No Patient Arrived: Ambulatory Any new allergies or adverse reactions: No Arrival Time: 09:25 Had a fall or experienced change in No Accompanied By: mother activities of daily living that may affect Transfer Assistance: None risk of falls: Patient Identification Verified: Yes Signs or symptoms of abuse/neglect since last No Secondary Verification Process Yes visito Completed: Hospitalized since last visit: No Patient Requires Transmission-Based No Has Dressing in Place as Prescribed: Yes Precautions: Pain Present Now: Yes Patient Has Alerts: No Electronic Signature(s) Signed: 04/30/2016 5:12:01 PM By: Montey Hora Entered By: Montey Hora on 04/30/2016 09:26:18 [name] ([id number]) -------------------------------------------------------------------------------- Clinic Level of Care Assessment Details Patient Name: Kim Pandy A. Date of Service: 04/30/2016 9:15 AM Medical Record Number: [id number] Patient Account Number: [id number] Date of Birth/Sex: 10-10-1965 (51 y.o. Female) Treating RN: Montey Hora Primary Care Geary Rufo: Vic Blackbird Other Clinician: Referring Tawfiq Favila: Vic Blackbird Treating Arbie Blankley/Extender: Frann Rider in Treatment: 3 Clinic Level of Care Assessment Items TOOL 4 Quantity Score []  - Use when only an EandM is performed on FOLLOW-UP visit 0 ASSESSMENTS - Nursing Assessment / Reassessment X -  Reassessment of Co-morbidities (includes updates in patient status) 1 10 X - Reassessment of Adherence to Treatment Plan 1 5 ASSESSMENTS - Wound and Skin Assessment / Reassessment X - Simple Wound Assessment / Reassessment - one wound 1 5 []  - Complex Wound Assessment / Reassessment - multiple wounds 0 []  - Dermatologic / Skin Assessment (not related to wound area) 0 ASSESSMENTS - Focused Assessment []  - Circumferential Edema Measurements - multi extremities 0 []  - Nutritional Assessment / Counseling / Intervention 0 X - Lower Extremity Assessment (monofilament, tuning fork, pulses) 1 5 []  - Peripheral Arterial Disease Assessment (using hand held doppler) 0 ASSESSMENTS - Ostomy and/or Continence Assessment and Care []  - Incontinence Assessment and Management 0 []  - Ostomy Care Assessment and Management (repouching, etc.) 0 PROCESS - Coordination of Care X - Simple Patient / Family Education for ongoing care 1 15 []  - Complex (extensive) Patient / Family Education for ongoing care 0 []  - Staff obtains Programmer, systems, Records, Test Results / Process Orders 0 []  - Staff telephones HHA, Nursing Homes / Clarify orders / etc 0 []  - Routine Transfer to another Facility (non-emergent condition) 0 Eggleton, Juanelle A. ([id number]) []  - Routine Hospital Admission (non-emergent condition) 0 []  - New Admissions / Biomedical engineer / Ordering NPWT, Apligraf, etc. 0 []  - Emergency Hospital Admission (emergent condition) 0 X - Simple Discharge Coordination 1 10 []  - Complex (extensive) Discharge Coordination 0 PROCESS - Special Needs []  - Pediatric / Minor Patient Management 0 []  - Isolation Patient Management 0 []  - Hearing / Language / Visual special needs 0 []  - Assessment of Community assistance (transportation, D/C planning, etc.) 0 []  - Additional assistance / Altered mentation 0 []  - Support Surface(s) Assessment (bed, cushion, seat, etc.) 0 INTERVENTIONS - Wound Cleansing / Measurement X -  Simple Wound Cleansing - one wound 1 5 []  - Complex Wound Cleansing - multiple wounds 0 X - Wound Imaging (photographs - any number of wounds) 1 5 []  -  Wound Tracing (instead of photographs) 0 X - Simple Wound Measurement - one wound 1 5 []  - Complex Wound Measurement - multiple wounds 0 INTERVENTIONS - Wound Dressings X - Small Wound Dressing one or multiple wounds 1 10 []  - Medium Wound Dressing one or multiple wounds 0 []  - Large Wound Dressing one or multiple wounds 0 []  - Application of Medications - topical 0 []  - Application of Medications - injection 0 INTERVENTIONS - Miscellaneous []  - External ear exam 0 Cunning, Wylodean A. ([id number]) []  - Specimen Collection (cultures, biopsies, blood, body fluids, etc.) 0 []  - Specimen(s) / Culture(s) sent or taken to Lab for analysis 0 []  - Patient Transfer (multiple staff / Harrel Lemon Lift / Similar devices) 0 []  - Simple Staple / Suture removal (25 or less) 0 []  - Complex Staple / Suture removal (26 or more) 0 []  - Hypo / Hyperglycemic Management (close monitor of Blood Glucose) 0 []  - Ankle / Brachial Index (ABI) - do not check if billed separately 0 X - Vital Signs 1 5 Has the patient been seen at the hospital within the last three years: Yes Total Score: 80 Level Of Care: New/Established - Level 3 Electronic Signature(s) Signed: 04/30/2016 5:12:01 PM By: Montey Hora Entered By: Montey Hora on 04/30/2016 09:49:32 Chabot, [name] ([id number]) -------------------------------------------------------------------------------- Encounter Discharge Information Details Patient Name: Kim Pandy A. Date of Service: 04/30/2016 9:15 AM Medical Record Number: [id number] Patient Account Number: [id number] Date of Birth/Sex: 1965-05-17 (51 y.o. Female) Treating RN: Montey Hora Primary Care Paco Cislo: Vic Blackbird Other Clinician: Referring Tkai Serfass: Vic Blackbird Treating Jayli Fogleman/Extender: Frann Rider in Treatment: 3 Encounter  Discharge Information Items Discharge Pain Level: 0 Discharge Condition: Stable Ambulatory Status: Ambulatory Discharge Destination: Home Transportation: Private Auto Accompanied By: mother Schedule Follow-up Appointment: Yes Medication Reconciliation completed and provided to Patient/Care No [place] Harral: Provided on Clinical Summary of Care: 04/30/2016 Form Type Recipient Paper Patient RM Electronic Signature(s) Signed: 04/30/2016 9:58:50 AM By: Ruthine Dose Entered By: Ruthine Dose on 04/30/2016 09:58:50 Estupinan, Arbell A. ([id number]) -------------------------------------------------------------------------------- Lower Extremity Assessment Details Patient Name: Kim Pandy A. Date of Service: 04/30/2016 9:15 AM Medical Record Number: [id number] Patient Account Number: [id number] Date of Birth/Sex: Feb 04, 1966 (51 y.o. Female) Treating RN: Montey Hora Primary Care Shoshanah Dapper: Vic Blackbird Other Clinician: Referring Laquentin Loudermilk: Vic Blackbird Treating Marquite Attwood/Extender: Frann Rider in Treatment: 3 Vascular Assessment Pulses: Dorsalis Pedis Palpable: [Left:Yes] Posterior Tibial Extremity colors, hair growth, and conditions: Extremity Color: [Left:Normal] Hair Growth on Extremity: [Left:Yes] Temperature of Extremity: [Left:Warm] Capillary Refill: [Left:< 3 seconds] Electronic Signature(s) Signed: 04/30/2016 5:12:01 PM By: Montey Hora Entered By: Montey Hora on 04/30/2016 09:34:59 Heffner, Drenda A. ([id number]) -------------------------------------------------------------------------------- Multi Wound Chart Details Patient Name: Kim Pandy A. Date of Service: 04/30/2016 9:15 AM Medical Record Number: [id number] Patient Account Number: [id number] Date of Birth/Sex: 02-06-1966 (51 y.o. Female) Treating RN: Montey Hora Primary Care Kanyah Matsushima: Vic Blackbird Other Clinician: Referring Myleah Cavendish: Vic Blackbird Treating Kinneth Fujiwara/Extender: Frann Rider  in Treatment: 3 Vital Signs Height(in): 64 Pulse(bpm): 64 Weight(lbs): 152.6 Blood Pressure 124/69 (mmHg): Body Mass Index(BMI): 26 Temperature(F): 98.3 Respiratory Rate 16 (breaths/min): Photos: [N/A:N/A] Wound Location: Left Lower Leg - Lateral N/A N/A Wounding Event: Gradually Appeared N/A N/A Primary Etiology: Pyoderma N/A N/A Comorbid History: Colitis N/A N/A Date Acquired: 02/13/2016 N/A N/A Weeks of Treatment: 3 N/A N/A Wound Status: Open N/A N/A Measurements L x W x D 1x0.9x0.1 N/A N/A (cm) Area (cm) : 0.707 N/A N/A Volume (cm) : 0.071 N/A N/A % Reduction in  Area: 59.30% N/A N/A % Reduction in Volume: 79.50% N/A N/A Classification: Partial Thickness N/A N/A Exudate Amount: Large N/A N/A Exudate Type: Serosanguineous N/A N/A Exudate Color: red, brown N/A N/A Wound Margin: Flat and Intact N/A N/A Granulation Amount: Large (67-100%) N/A N/A Granulation Quality: Red N/A N/A Necrotic Amount: Small (1-33%) N/A N/A Necrotic Tissue: Eschar, Adherent Slough N/A N/A Start, Mellany A. ([id number]) Exposed Structures: Fat Layer (Subcutaneous N/A N/A Tissue) Exposed: Yes Fascia: No Tendon: No Muscle: No Joint: No Bone: No Epithelialization: Small (1-33%) N/A N/A Periwound Skin Texture: No Abnormalities Noted N/A N/A Periwound Skin Dry/Scaly: Yes N/A N/A Moisture: Periwound Skin Color: Erythema: Yes N/A N/A Erythema Location: Circumferential N/A N/A Temperature: No Abnormality N/A N/A Tenderness on Yes N/A N/A Palpation: Wound Preparation: Ulcer Cleansing: N/A N/A Rinsed/Irrigated with Saline Topical Anesthetic Applied: Other: lidocaine 4% Treatment Notes Wound #1 (Left, Lateral Lower Leg) 1. Cleansed with: Clean wound with Normal Saline 2. Anesthetic Topical Lidocaine 4% cream to wound bed prior to debridement 4. Dressing Applied: Medihoney Gel 5. Secondary Dressing Applied Dry [place] Signature(s) Signed: 04/30/2016 9:56:15  AM By: Christin Fudge MD, FACS Entered By: Christin Fudge on 04/30/2016 09:56:15 [name] ([id number]) -------------------------------------------------------------------------------- [place] Details Patient Name: Kim Pandy A. Date of Service: 04/30/2016 9:15 AM Medical Record Number: [id number] Patient Account Number: [id number] Date of Birth/Sex: 1965-08-01 (51 y.o. Female) Treating RN: Montey Hora Primary Care Juanita Devincent: Vic Blackbird Other Clinician: Referring Sulay Brymer: Vic Blackbird Treating Yehudit Fulginiti/Extender: Frann Rider in Treatment: 3 Active Inactive ` Nutrition Nursing Diagnoses: Imbalanced nutrition Goals: Patient/caregiver agrees to and verbalizes understanding of need to use nutritional supplements and/or vitamins as prescribed Date Initiated: 04/06/2016 Target Resolution Date: 05/13/2016 Goal Status: Active Interventions: Assess patient nutrition upon admission and as needed per policy Notes: ` Orientation to the Wound Care Program Nursing Diagnoses: Knowledge deficit related to the wound healing center program Goals: Patient/caregiver will verbalize understanding of the [place] Date Initiated: 04/06/2016 Target Resolution Date: 04/25/2016 Goal Status: Active Interventions: Provide education on orientation to the wound center Notes: ` Pain, Acute or Chronic Nursing Diagnoses: Pain Management - Cyclic Acute (Dressing Change Related) Schaben, Charnese A. ([id number]) Pain Management - Non-cyclic Acute (Procedural) Pain Management - Non-cyclic Chronic Pain Pain, acute or chronic: actual or potential Potential alteration in comfort, pain Goals: Patient/caregiver will verbalize adequate pain control between visits Date Initiated: 04/06/2016 Target Resolution Date: 05/13/2016 Goal Status: Active Interventions: Assess comfort goal upon admission Complete pain assessment as per visit  requirements Notes: ` Soft Tissue Infection Nursing Diagnoses: Impaired tissue integrity Goals: Patient will remain free of wound infection Date Initiated: 04/06/2016 Target Resolution Date: 05/13/2016 Goal Status: Active Interventions: Assess signs and symptoms of infection every visit Notes: ` Wound/Skin Impairment Nursing Diagnoses: Impaired tissue integrity Goals: Ulcer/skin breakdown will have a volume reduction of 80% by week 12 Date Initiated: 04/06/2016 Target Resolution Date: 05/13/2016 Goal Status: Active Interventions: Assess patient/caregiver ability to perform ulcer/skin care regimen upon admission and as needed Notes: PIEDAD, STANDIFORD ([id number]) Electronic Signature(s) Signed: 04/30/2016 5:12:01 PM By: Montey Hora Entered By: Montey Hora on 04/30/2016 09:46:51 Mccall, Nila A. ([id number]) -------------------------------------------------------------------------------- Pain Assessment Details Patient Name: Kim Pandy A. Date of Service: 04/30/2016 9:15 AM Medical Record Number: [id number] Patient Account Number: [id number] Date of Birth/Sex: June 16, 1965 (50 y.o. Female) Treating RN: Montey Hora Primary Care Neel Buffone: Vic Blackbird Other Clinician: Referring Amillion Scobee: Vic Blackbird Treating Puanani Gene/Extender: Frann Rider in Treatment: 3 Active Problems Location of  Pain Severity and Description of Pain Patient Has Paino Yes Site Locations Pain Location: Pain in Ulcers With Dressing Change: Yes Duration of the Pain. Constant / Intermittento Constant Rate the pain. Worst Pain Level: 3 Pain Management and Medication Current Pain Management: Notes Topical or injectable lidocaine is offered to patient for acute pain when surgical debridement is performed. If needed, Patient is instructed to use over the counter pain medication for the following 24-48 hours after debridement. Wound care MDs do not prescribed pain medications. Patient has  chronic pain or uncontrolled pain. Patient has been instructed to make an appointment with their Primary Care Physician for pain management. Electronic Signature(s) Signed: 04/30/2016 5:12:01 PM By: Montey Hora Entered By: Montey Hora on 04/30/2016 09:26:45 [name] ([id number]) -------------------------------------------------------------------------------- Patient/Caregiver Education Details Patient Name: Kim Pandy A. Date of Service: 04/30/2016 9:15 AM Medical Record Number: [id number] Patient Account Number: [id number] Date of Birth/Gender: [birth year] (51 y.o. Female) Treating RN: Montey Hora Primary Care Physician: Vic Blackbird Other Clinician: Referring Physician: Vic Blackbird Treating Physician/Extender: Frann Rider in Treatment: 3 Education Assessment Education Provided To: Patient Education Topics Provided Wound/Skin Impairment: Handouts: Other: wound care as ordered Methods: Demonstration, Explain/Verbal Responses: State content correctly Electronic Signature(s) Signed: 04/30/2016 5:12:01 PM By: Montey Hora Entered By: Montey Hora on 04/30/2016 09:50:27 Siple, Kona A. ([id number]) -------------------------------------------------------------------------------- Wound Assessment Details Patient Name: Kim Pandy A. Date of Service: 04/30/2016 9:15 AM Medical Record Number: [id number] Patient Account Number: [id number] Date of Birth/Sex: [birth year] (51 y.o. Female) Treating RN: Montey Hora Primary Care Liya Strollo: Vic Blackbird Other Clinician: Referring Allyne Hebert: Vic Blackbird Treating Santoria Chason/Extender: Frann Rider in Treatment: 3 Wound Status Wound Number: 1 Primary Etiology: Pyoderma Wound Location: Left Lower Leg - Lateral Wound Status: Open Wounding Event: Gradually Appeared Comorbid History: Colitis Date Acquired: 02/13/2016 Weeks Of Treatment: 3 Clustered Wound: No Photos Wound Measurements Length:  (cm) 1 Width: (cm) 0.9 Depth: (cm) 0.1 Area: (cm) 0.707 Volume: (cm) 0.071 % Reduction in Area: 59.3% % Reduction in Volume: 79.5% Epithelialization: Small (1-33%) Tunneling: No Undermining: No Wound Description Classification: Partial Thickness Wound Margin: Flat and Intact Exudate Amount: Large Exudate Type: Serosanguineous Exudate Color: red, brown Foul Odor After Cleansing: No Slough/Fibrino Yes Wound Bed Granulation Amount: Large (67-100%) Exposed Structure Granulation Quality: Red Fascia Exposed: No Necrotic Amount: Small (1-33%) Fat Layer (Subcutaneous Tissue) Exposed: Yes Necrotic Quality: Eschar, Adherent Slough Tendon Exposed: No Muscle Exposed: No Garson, Niylah A. ([id number]) Joint Exposed: No Bone Exposed: No Periwound Skin Texture Texture Color No Abnormalities Noted: No No Abnormalities Noted: No Erythema: Yes Moisture Erythema Location: Circumferential No Abnormalities Noted: No Dry / Scaly: Yes Temperature / Pain Temperature: No Abnormality Tenderness on Palpation: Yes Wound Preparation Ulcer Cleansing: Rinsed/Irrigated with Saline Topical Anesthetic Applied: Other: lidocaine 4%, Treatment Notes Wound #1 (Left, Lateral Lower Leg) 1. Cleansed with: Clean wound with Normal Saline 2. Anesthetic Topical Lidocaine 4% cream to wound bed prior to debridement 4. Dressing Applied: Medihoney Gel 5. Secondary Dressing Applied Dry [place] Signature(s) Signed: 04/30/2016 5:12:01 PM By: Montey Hora Entered By: Montey Hora on 04/30/2016 09:34:14 [name] ([id number]) -------------------------------------------------------------------------------- [place] Details Patient Name: Kim Pandy A. Date of Service: 04/30/2016 9:15 AM Medical Record Number: [id number] Patient Account Number: [id number] Date of Birth/Sex: October 12, 1965 (51 y.o. Female) Treating RN: Montey Hora Primary Care Khari Mally: Vic Blackbird Other  Clinician: Referring Romero Letizia: Vic Blackbird Treating Arihaan Bellucci/Extender: Frann Rider in Treatment: 3 Vital Signs Time Taken: 09:32 Temperature (F): 98.3 Height (in): 64 Pulse (  bpm): 64 Weight (lbs): 152.6 Respiratory Rate (breaths/min): 16 Body Mass Index (BMI): 26.2 Blood Pressure (mmHg): 124/69 Reference Range: 80 - 120 mg / dl Electronic Signature(s) Signed: 04/30/2016 5:12:01 PM By: Montey Hora Entered By: Montey Hora on 04/30/2016 09:33:01

## 2016-05-11 MED FILL — LIALDA 1.2 GM TABLET SA: 1.2 | 30 days supply | Qty: 120 | Fill #3

## 2016-05-11 MED FILL — SIMPONI 100 MG/ML SOAJ: 100 | 28 days supply | Qty: 1 | Fill #7

## 2016-05-14 ENCOUNTER — Encounter: Payer: 59 | Attending: Surgery | Admitting: Surgery

## 2016-05-14 DIAGNOSIS — K51918 Ulcerative colitis, unspecified with other complication: Secondary | ICD-10-CM | POA: Insufficient documentation

## 2016-05-14 DIAGNOSIS — D819 Combined immunodeficiency, unspecified: Secondary | ICD-10-CM | POA: Insufficient documentation

## 2016-05-14 DIAGNOSIS — L88 Pyoderma gangrenosum: Secondary | ICD-10-CM | POA: Diagnosis not present

## 2016-05-14 DIAGNOSIS — L97222 Non-pressure chronic ulcer of left calf with fat layer exposed: Secondary | ICD-10-CM | POA: Diagnosis not present

## 2016-05-14 DIAGNOSIS — S81802D Unspecified open wound, left lower leg, subsequent encounter: Secondary | ICD-10-CM | POA: Diagnosis not present

## 2016-05-15 NOTE — Progress Notes (Signed)
[name] ([id number]) Visit Report for 05/14/2016 Chief Complaint Document Details Patient Name: [name], [name] Date of Service: 05/14/2016 11:00 AM Medical Record Number: [id number] Patient Account Number: [id number] Date of Birth/Sex: [birth year] (51 y.o. Female) Treating RN: Carolyne Fiscal, Debi Primary Care Provider: Vic Blackbird Other Clinician: Referring Provider: Vic Blackbird Treating Provider/Extender: Frann Rider in Treatment: 5 Information Obtained from: Patient Chief Complaint Patient presents to the wound care center for a consult due non healing wound the left lower extremity which she's had for over 7 weeks Electronic Signature(s) Signed: 05/14/2016 11:33:55 AM By: Christin Fudge MD, FACS Entered By: Christin Fudge on 05/14/2016 11:33:55 [name] ([id number]) -------------------------------------------------------------------------------- HPI Details Patient Name: Kim Pandy A. Date of Service: 05/14/2016 11:00 AM Medical Record Number: [id number] Patient Account Number: [id number] Date of Birth/Sex: Dec 10, 1965 (51 y.o. Female) Treating RN: Carolyne Fiscal, Debi Primary Care Provider: Vic Blackbird Other Clinician: Referring Provider: Vic Blackbird Treating Provider/Extender: Frann Rider in Treatment: 5 History of Present Illness Location: started off as a pustule on the left lower extremity Quality: Patient reports experiencing a sharp pain to affected area(s). Severity: Patient states wound are getting worse. Duration: Patient has had the wound for > 8 weeks prior to seeking treatment at the wound center Timing: Pain in wound is constant (hurts all the time) Context: The wound appeared gradually over time Modifying Factors: Other treatment(s) tried include:Bactrim and a couple of IandD's Associated Signs and Symptoms: Patient reports having increase discharge. HPI Description: 51 year old patient was seen by her PCP Dr. Buelah Manis, for left lower  extremity cellulitis which has not improved over time. She has ulcerative colitis and is on Biologics -- Simponi. She was initially seen in December and a culture was negative and the patient had been started on Bactrim. She had an IandD of an superficial abscess done on December 28 and the repeat culture was negative. There was a question of whether this could be a spider bite. She is on immunosuppressants for her ulcerative colitis. Her past medical history significant for ulcerative colitis, status post abdominal hernia repair, multiple colonoscopies. She is not a smoker. 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 we have discussed this report and she is going to be in touch with a rheumatologist in [place] to look after her care Electronic Signature(s) Signed: 05/14/2016 11:34:00 AM By: Christin Fudge MD, FACS Entered By: Christin Fudge on 05/14/2016 11:34:00 [name] ([id number]) -------------------------------------------------------------------------------- Physical Exam Details Patient Name: Kim Pandy A. Date of Service: 05/14/2016 11:00 AM Medical Record Number: [id number] Patient Account Number: [id number] Date of Birth/Sex: [birth year] (51 y.o. Female) Treating RN: Carolyne Fiscal, Debi Primary Care Provider: Vic Blackbird Other Clinician: Referring Provider: Vic Blackbird Treating Provider/Extender: Frann Rider in Treatment: 5 Constitutional . Pulse regular. Respirations normal and unlabored. Afebrile. . Eyes Nonicteric. Reactive to light. Ears, Nose, Mouth, and Throat Lips, teeth, and gums WNL.Marland Kitchen Moist mucosa without lesions. Neck supple and nontender. No palpable supraclavicular or cervical adenopathy. Normal sized without goiter. Respiratory WNL. No retractions.. Breath sounds WNL, No rubs, rales, rhonchi, or wheeze.. Cardiovascular Heart rhythm and rate regular, no murmur or gallop.. Pedal  Pulses WNL. No clubbing, cyanosis or edema. Chest Breasts symmetical and no nipple discharge.. Breast tissue WNL, no masses, lumps, or tenderness.. Lymphatic No adneopathy. No adenopathy. No adenopathy. Musculoskeletal Adexa without tenderness or enlargement.. Digits and nails w/o clubbing, cyanosis, infection, petechiae, ischemia, or inflammatory conditions.. Integumentary (Hair, Skin) No suspicious lesions. No crepitus or  fluctuance. No peri-wound warmth or erythema. No masses.Marland Kitchen Psychiatric Judgement and insight Intact.. No evidence of depression, anxiety, or agitation.. Notes the wound is completely healed and there is no evidence of any open ulceration. Electronic Signature(s) Signed: 05/14/2016 11:34:18 AM By: Christin Fudge MD, FACS Entered By: Christin Fudge on 05/14/2016 11:34:17 [name] ([id number]) -------------------------------------------------------------------------------- Physician Orders Details Patient Name: Kim Pandy A. Date of Service: 05/14/2016 11:00 AM Medical Record Number: [id number] Patient Account Number: [id number] Date of Birth/Sex: 11/06/1965 (51 y.o. Female) Treating RN: Carolyne Fiscal, Debi Primary Care Provider: Vic Blackbird Other Clinician: Referring Provider: Vic Blackbird Treating Provider/Extender: Frann Rider in Treatment: 5 Verbal / Phone Orders: Yes Clinician: Carolyne Fiscal, Debi Read Back and Verified: Yes Diagnosis Coding Discharge From [facility] Services o Discharge from [place] - Please call or contact our office if you have any questions or concerns. Electronic Signature(s) Signed: 05/14/2016 4:14:08 PM By: Christin Fudge MD, FACS Signed: 05/14/2016 4:36:14 PM By: Alric Quan Entered By: Alric Quan on 05/14/2016 11:05:06 [name] ([id number]) -------------------------------------------------------------------------------- Problem List Details Patient Name: Kim Pandy A. Date of Service: 05/14/2016  11:00 AM Medical Record Number: [id number] Patient Account Number: [id number] Date of Birth/Sex: 11/23/1965 (51 y.o. 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 Date of Service: 05/14/2016 11:00 AM Medical Record Number: [id number] Patient Account Number: [id number] Date of Birth/Sex: September 09, 1965 (51 y.o. Female) Treating RN: Carolyne Fiscal, Debi Primary Care Provider: Vic Blackbird Other Clinician: Referring Provider: Vic Blackbird Treating Provider/Extender: Frann Rider in Treatment: 5 Subjective Chief Complaint Information obtained from Patient Patient presents to the wound care center for a consult due non healing wound the left lower extremity which she's had for over 7 weeks History of Present Illness (HPI) The following HPI elements were documented for the patient's wound: Location: started off as a pustule on the left lower extremity Quality: Patient reports experiencing a sharp pain to affected area(s). Severity: Patient states wound are getting worse. Duration: Patient has had the wound for > 8 weeks prior to seeking treatment at the wound center Timing: Pain in  wound is constant (hurts all the time) Context: The wound appeared gradually over time Modifying Factors: Other treatment(s) tried include:Bactrim and a couple of IandD's Associated Signs and Symptoms: Patient reports having increase discharge. 51 year old patient was seen by her PCP Dr. Buelah Manis, for left lower extremity cellulitis which has not improved over time. She has ulcerative colitis and is on Biologics -- Simponi. She was initially seen in December and a culture was negative and the patient had been started on Bactrim. She had an IandD of an superficial abscess done on December 28 and the repeat culture was negative. There was a question of whether this could be a spider bite. She is on immunosuppressants for her ulcerative colitis. Her past medical history significant for ulcerative colitis, status post abdominal hernia repair, multiple colonoscopies. She is not a smoker. 04/30/2016 -- -- pathology report of the left lower  extremity was consistent with pyoderma gangrenosum. we have discussed this report and she is going to be in touch with a rheumatologist in [place] to look after her care Objective Constitutional Pulse regular. Respirations normal and unlabored. Afebrile. [name], Kim A. ([id number]) Vitals Time Taken: 10:49 AM, Height: 64 in, Weight: 152.6 lbs, BMI: 26.2, Temperature: 97.6 F, Pulse: 71 bpm, Respiratory Rate: 16 breaths/min, Blood Pressure: 113/66 mmHg. Eyes Nonicteric. Reactive to light. Ears, Nose, Mouth, and Throat Lips, teeth, and gums WNL.Marland Kitchen Moist mucosa without lesions. Neck supple and nontender. No palpable supraclavicular or cervical adenopathy. Normal sized without goiter. Respiratory WNL. No retractions.. Breath sounds WNL, No rubs, rales, rhonchi, or wheeze.. Cardiovascular Heart rhythm and rate regular, no murmur or gallop.. Pedal Pulses WNL. No clubbing, cyanosis or edema. Chest Breasts symmetical and no nipple discharge.. Breast tissue WNL,  no masses, lumps, or tenderness.. Lymphatic No adneopathy. No adenopathy. No adenopathy. Musculoskeletal Adexa without tenderness or enlargement.. Digits and nails w/o clubbing, cyanosis, infection, petechiae, ischemia, or inflammatory conditions.Marland Kitchen Psychiatric Judgement and insight Intact.. No evidence of depression, anxiety, or agitation.. General Notes: the wound is completely healed and there is no evidence of any open ulceration. Integumentary (Hair, Skin) No suspicious lesions. No crepitus or fluctuance. No peri-wound warmth or erythema. No masses.. Wound #1 status is Healed - Epithelialized. Original cause of wound was Gradually Appeared. The wound is located on the Left,Lateral Lower Leg. The wound measures 0cm length x 0cm width x 0cm depth; 0cm^2 area and 0cm^3 volume. There is no tunneling or undermining noted. There is a none present amount of drainage noted. The wound margin is flat and intact. There is no granulation within the wound bed. There is no necrotic tissue within the wound bed. Periwound temperature was noted as No Abnormality. Assessment [name], Kim A. ([id number]) Active Problems ICD-10 [phone] - Non-pressure chronic ulcer of left calf with fat layer exposed L88 - Pyoderma gangrenosum K51.918 - Ulcerative colitis, unspecified with other complication B51.0 - Combined immunodeficiency, unspecified Plan Discharge From [facility] Services: Discharge from [place] - Please call or contact our office if you have any questions or concerns. I have recommended she follow-up with her rheumatologist who she is awaiting an appointment at [facility]. I would be happy to talk to them if the need arises. now that the wound has healed, I have recommended we treat her with a bordered foam dressing to protect this supple scar She may need appropriate long-term therapy in conjunction with her gastroenterologist and internal medicine. She is discharged on the wound care  services and be seen back as needed. Electronic Signature(s) Signed: 05/14/2016 11:35:50 AM By: Christin Fudge MD, FACS Entered By: Christin Fudge on 05/14/2016 11:35:50 [name] ([id number]) -------------------------------------------------------------------------------- SuperBill Details Patient Name: Kim Pandy A. Date of Service: 05/14/2016 Medical Record Number: [id number] Patient Account Number: [id number] Date of Birth/Sex: [birth year] (51 y.o. Female) Treating RN: Carolyne Fiscal, Debi Primary Care Provider: Vic Blackbird Other Clinician: Referring Provider: Vic Blackbird Treating Provider/Extender: Christin Fudge Service Line: Outpatient Weeks in Treatment: 5 Diagnosis Coding ICD-10 Codes Code Description [phone] Non-pressure chronic ulcer of left calf with fat layer exposed L88 Pyoderma gangrenosum K51.918 Ulcerative colitis, unspecified with other complication Q67.6 Combined immunodeficiency, unspecified Facility Procedures CPT4 Code: [id number] Description: 71245 - WOUND CARE VISIT-LEV 2 EST PT Modifier: Quantity: 1 Physician Procedures CPT4 Code: 8099833 Description: 82505 - WC PHYS LEVEL 3 - EST PT ICD-10 Description Diagnosis L97.222 Non-pressure chronic ulcer of left calf with fat L88 Pyoderma gangrenosum K51.918 Ulcerative  colitis, unspecified with other compli D81.9 Combined immunodeficiency,  unspecified Modifier: layer exposed cation Quantity: 1 Electronic Signature(s) Signed: 05/14/2016 4:14:08 PM By: Christin Fudge MD, FACS Signed: 05/14/2016 4:36:14 PM By: Alric Quan Previous Signature: 05/14/2016 11:36:03 AM Version By: Christin Fudge MD, FACS Entered By: Alric Quan on 05/14/2016 11:38:29

## 2016-05-15 NOTE — Progress Notes (Signed)
SPRUHA, WEIGHT ([id number]) Visit Report for 05/14/2016 Arrival Information Details Patient Name: [name], Kim A. Date of Service: 05/14/2016 11:00 AM Medical Record Number: [id number] Patient Account Number: [id number] Date of Birth/Sex: 05-18-1965 (51 y.o. Female) Treating RN: Carolyne Fiscal, Debi Primary Care Jefferey Lippmann: Vic Blackbird Other Clinician: Referring Ingvald Theisen: Vic Blackbird Treating Lateefah Mallery/Extender: Frann Rider in Treatment: 5 Visit Information History Since Last Visit All ordered tests and consults were completed: No Patient Arrived: Ambulatory Added or deleted any medications: No Arrival Time: 10:47 Any new allergies or adverse reactions: No Accompanied By: mother Had a fall or experienced change in No Transfer Assistance: None activities of daily living that may affect Patient Identification Verified: Yes risk of falls: Secondary Verification Process Yes Signs or symptoms of abuse/neglect since last No Completed: visito Patient Requires Transmission-Based No Hospitalized since last visit: No Precautions: Has Dressing in Place as Prescribed: Yes Patient Has Alerts: No Pain Present Now: No Electronic Signature(s) Signed: 05/14/2016 4:36:14 PM By: Alric Quan Entered By: Alric Quan on 05/14/2016 10:49:09 Einstein, [name] ([id number]) -------------------------------------------------------------------------------- Clinic Level of Care Assessment Details Patient Name: Kim Pandy A. Date of Service: 05/14/2016 11:00 AM Medical Record Number: [id number] Patient Account Number: [id number] Date of Birth/Sex: 03-May-1965 (51 y.o. Female) Treating RN: Carolyne Fiscal, Debi Primary Care Nhung Danko: Vic Blackbird Other Clinician: Referring Alonzo Loving: Vic Blackbird Treating Natalie Leclaire/Extender: Frann Rider in Treatment: 5 Clinic Level of Care Assessment Items TOOL 4 Quantity Score X - Use when only an EandM is performed on FOLLOW-UP visit 1  0 ASSESSMENTS - Nursing Assessment / Reassessment X - Reassessment of Co-morbidities (includes updates in patient status) 1 10 X - Reassessment of Adherence to Treatment Plan 1 5 ASSESSMENTS - Wound and Skin Assessment / Reassessment X - Simple Wound Assessment / Reassessment - one wound 1 5 []  - Complex Wound Assessment / Reassessment - multiple wounds 0 []  - Dermatologic / Skin Assessment (not related to wound area) 0 ASSESSMENTS - Focused Assessment []  - Circumferential Edema Measurements - multi extremities 0 []  - Nutritional Assessment / Counseling / Intervention 0 []  - Lower Extremity Assessment (monofilament, tuning fork, pulses) 0 []  - Peripheral Arterial Disease Assessment (using hand held doppler) 0 ASSESSMENTS - Ostomy and/or Continence Assessment and Care []  - Incontinence Assessment and Management 0 []  - Ostomy Care Assessment and Management (repouching, etc.) 0 PROCESS - Coordination of Care X - Simple Patient / Family Education for ongoing care 1 15 []  - Complex (extensive) Patient / Family Education for ongoing care 0 []  - Staff obtains Programmer, systems, Records, Test Results / Process Orders 0 []  - Staff telephones HHA, Nursing Homes / Clarify orders / etc 0 []  - Routine Transfer to another Facility (non-emergent condition) 0 Virag, Anneta A. ([id number]) []  - Routine Hospital Admission (non-emergent condition) 0 []  - New Admissions / Biomedical engineer / Ordering NPWT, Apligraf, etc. 0 []  - Emergency Hospital Admission (emergent condition) 0 X - Simple Discharge Coordination 1 10 []  - Complex (extensive) Discharge Coordination 0 PROCESS - Special Needs []  - Pediatric / Minor Patient Management 0 []  - Isolation Patient Management 0 []  - Hearing / Language / Visual special needs 0 []  - Assessment of Community assistance (transportation, D/C planning, etc.) 0 []  - Additional assistance / Altered mentation 0 []  - Support Surface(s) Assessment (bed, cushion, seat, etc.)  0 INTERVENTIONS - Wound Cleansing / Measurement X - Simple Wound Cleansing - one wound 1 5 []  - Complex Wound Cleansing - multiple wounds 0 X - Wound Imaging (photographs -  any number of wounds) 1 5 []  - Wound Tracing (instead of photographs) 0 []  - Simple Wound Measurement - one wound 0 []  - Complex Wound Measurement - multiple wounds 0 INTERVENTIONS - Wound Dressings []  - Small Wound Dressing one or multiple wounds 0 []  - Medium Wound Dressing one or multiple wounds 0 []  - Large Wound Dressing one or multiple wounds 0 []  - Application of Medications - topical 0 []  - Application of Medications - injection 0 INTERVENTIONS - Miscellaneous []  - External ear exam 0 Rubio, Kim A. ([id number]) []  - Specimen Collection (cultures, biopsies, blood, body fluids, etc.) 0 []  - Specimen(s) / Culture(s) sent or taken to Lab for analysis 0 []  - Patient Transfer (multiple staff / Harrel Lemon Lift / Similar devices) 0 []  - Simple Staple / Suture removal (25 or less) 0 []  - Complex Staple / Suture removal (26 or more) 0 []  - Hypo / Hyperglycemic Management (close monitor of Blood Glucose) 0 []  - Ankle / Brachial Index (ABI) - do not check if billed separately 0 X - Vital Signs 1 5 Has the patient been seen at the hospital within the last three years: Yes Total Score: 60 Level Of Care: New/Established - Level 2 Electronic Signature(s) Signed: 05/14/2016 4:36:14 PM By: Alric Quan Entered By: Alric Quan on 05/14/2016 11:38:20 [name] ([id number]) -------------------------------------------------------------------------------- Encounter Discharge Information Details Patient Name: Kim Pandy A. Date of Service: 05/14/2016 11:00 AM Medical Record Number: [id number] Patient Account Number: [id number] Date of Birth/Sex: [birth year] (51 y.o. Female) Treating RN: Carolyne Fiscal, Debi Primary Care Refoel Palladino: Vic Blackbird Other Clinician: Referring Lavinia Mcneely: Vic Blackbird Treating  Frayda Egley/Extender: Frann Rider in Treatment: 5 Encounter Discharge Information Items Discharge Pain Level: 0 Discharge Condition: Stable Ambulatory Status: Ambulatory Discharge Destination: Home Transportation: Private Auto Accompanied By: mother Schedule Follow-up Appointment: No Medication Reconciliation completed and provided to Patient/Care No Leo Weyandt: Provided on Clinical Summary of Care: 05/14/2016 Form Type Recipient Paper Patient RM Electronic Signature(s) Signed: 05/14/2016 11:07:52 AM By: Ruthine Dose Entered By: Ruthine Dose on 05/14/2016 11:07:51 Muckey, Medina A. ([id number]) -------------------------------------------------------------------------------- Lower Extremity Assessment Details Patient Name: Kim Pandy A. Date of Service: 05/14/2016 11:00 AM Medical Record Number: [id number] Patient Account Number: [id number] Date of Birth/Sex: 1965-07-16 (51 y.o. Female) Treating RN: Carolyne Fiscal, Debi Primary Care Kewan Mcnease: Vic Blackbird Other Clinician: Referring Lashuna Tamashiro: Vic Blackbird Treating Athanasios Heldman/Extender: Frann Rider in Treatment: 5 Vascular Assessment Pulses: Dorsalis Pedis Palpable: [Left:Yes] Posterior Tibial Extremity colors, hair growth, and conditions: Extremity Color: [Left:Normal] Temperature of Extremity: [Left:Warm] Capillary Refill: [Left:< 3 seconds] Electronic Signature(s) Signed: 05/14/2016 4:36:14 PM By: Alric Quan Entered By: Alric Quan on 05/14/2016 10:53:14 Kulikowski, Tayra A. ([id number]) -------------------------------------------------------------------------------- Multi Wound Chart Details Patient Name: Kim Pandy A. Date of Service: 05/14/2016 11:00 AM Medical Record Number: [id number] Patient Account Number: [id number] Date of Birth/Sex: 1966/02/06 (51 y.o. 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] [N/A:N/A] Comorbid History: [1:Colitis] [N/A:N/A] Date Acquired: [1:02/13/2016] [N/A:N/A] Weeks of Treatment: [1:5] [N/A:N/A] Wound Status: [1:Healed - Epithelialized] [N/A:N/A] Measurements L x W x D 0x0x0 [N/A:N/A] (cm) Area (cm) : [1:0] [N/A:N/A] Volume (cm) : [1:0] [N/A:N/A] % Reduction in Area: [1:100.00%] [N/A:N/A] % Reduction  in Volume: 100.00% [N/A:N/A] Classification: [1:Partial Thickness] [N/A:N/A] Exudate Amount: [1:None Present] [N/A:N/A] Wound Margin: [1:Flat and Intact] [N/A:N/A] Granulation Amount: [1:None Present (0%)] [N/A:N/A] Necrotic Amount: [1:None Present (0%)] [N/A:N/A] Epithelialization: [1:Large (67-100%)] [N/A:N/A] Periwound Skin Texture: No Abnormalities Noted [N/A:N/A] Periwound Skin [1:No Abnormalities Noted] [N/A:N/A] Moisture: Periwound Skin Color: No Abnormalities Noted [N/A:N/A] Temperature: [1:No Abnormality] [N/A:N/A] Tenderness on [1:No] [N/A:N/A] Palpation: Wound Preparation: [1:Ulcer Cleansing: Rinsed/Irrigated with] [N/A:N/A] Saline Topical Anesthetic Applied: None Treatment Notes Electronic Signature(s) Signed: 05/14/2016 11:33:50 AM By: Christin Fudge MD, FACS Entered By: Christin Fudge on 05/14/2016 11:33:49 [name] ([id number]) -------------------------------------------------------------------------------- [place] Details Patient Name: Kim Pandy A. Date of Service: 05/14/2016 11:00 AM Medical Record Number: [id number] Patient Account Number: [id number] Date of Birth/Sex: 04-25-1965 (51 y.o.  Female) Treating RN: Carolyne Fiscal, Debi Primary Care Josette Shimabukuro: Vic Blackbird Other Clinician: Referring Kaileena Obi: Vic Blackbird Treating Andreus Cure/Extender: Frann Rider in Treatment: 5 Active Inactive Electronic Signature(s) Signed: 05/14/2016 4:36:14 PM By: Alric Quan Entered By: Alric Quan on 05/14/2016 11:04:22 [name] ([id number]) -------------------------------------------------------------------------------- Pain Assessment Details Patient Name: Kim Pandy A. Date of Service: 05/14/2016 11:00 AM Medical Record Number: [id number] Patient Account Number: [id number] Date of Birth/Sex: Sep 21, 1965 (51 y.o. Female) Treating RN: Carolyne Fiscal, Debi Primary Care Rhealynn Myhre: Vic Blackbird Other Clinician: Referring Atia Haupt: Vic Blackbird Treating Myranda Pavone/Extender: Frann Rider in Treatment: 5 Active Problems Location of Pain Severity and Description of Pain Patient Has Paino No Site Locations With Dressing Change: No Pain Management and Medication Current Pain Management: Electronic Signature(s) Signed: 05/14/2016 4:36:14 PM By: Alric Quan Entered By: Alric Quan on 05/14/2016 10:49:16 [name] ([id number]) -------------------------------------------------------------------------------- Patient/Caregiver Education Details Patient Name: Kim Pandy A. Date of Service: 05/14/2016 11:00 AM Medical Record Number: [id number] Patient Account Number: [id number] Date of Birth/Gender: 02-12-1966 (51 y.o. Female) Treating RN: Carolyne Fiscal, Debi Primary Care Physician: Vic Blackbird Other Clinician: Referring Physician: Vic Blackbird Treating Physician/Extender: Frann Rider in Treatment: 5 Education Assessment Education Provided To: Patient Education Topics Provided Wound/Skin Impairment: Handouts: Other: Please call or contact our office if you have any questions or concerns. Methods: Explain/Verbal Responses: State  content correctly Electronic Signature(s) Signed: 05/14/2016 4:36:14 PM By: Alric Quan Entered By: Alric Quan on 05/14/2016 11:06:11 [name] ([id number]) -------------------------------------------------------------------------------- Wound Assessment Details Patient Name: Kim Pandy A. Date of Service: 05/14/2016 11:00 AM Medical Record Number: [id number] Patient Account Number: [id number] Date of Birth/Sex: [birth year] (51 y.o. Female) Treating RN: Carolyne Fiscal, Debi Primary Care Dan Dissinger: Vic Blackbird Other Clinician: Referring Jontavious Commons: Vic Blackbird Treating Anniebell Bedore/Extender: Frann Rider in Treatment: 5 Wound Status Wound Number: 1 Primary Etiology: Pyoderma Wound Location: Left Lower Leg - Lateral Wound Status: Healed - Epithelialized Wounding Event: Gradually Appeared Comorbid History: Colitis Date Acquired: 02/13/2016 Weeks Of Treatment: 5 Clustered Wound: No Photos Photo Uploaded By: Alric Quan on 05/14/2016 11:46:37 Wound Measurements Length: (cm) 0 % Reduction i Width: (cm) 0 % Reduction i Depth: (cm) 0 Epithelializa Area: (cm) 0 Tunneling: Volume: (cm) 0 Undermining: n Area: 100% n Volume: 100% tion: Large (67-100%) No No Wound Description Classification: Partial Thickness Foul Odor Aft Wound Margin: Flat and Intact Slough/Fibrin Exudate Amount: None Present er Cleansing: No o No Wound Bed Granulation Amount: None Present (0%) Necrotic Amount: None Present (0%) Periwound Skin Texture Texture Color No Abnormalities Noted: No No Abnormalities Noted: No Moisture Temperature / Pain Zentz, Dannah A. ([id number]) No Abnormalities Noted: No Temperature: No Abnormality Wound Preparation Ulcer Cleansing: Rinsed/Irrigated with Saline Topical Anesthetic Applied: None Electronic Signature(s) Signed: 05/14/2016 4:36:14 PM  By: Alric Quan Entered By: Alric Quan on 05/14/2016 11:03:59 Longest, [name]  ([id number]) -------------------------------------------------------------------------------- Vitals Details Patient Name: Kim Pandy A. Date of Service: 05/14/2016 11:00 AM Medical Record Number: [id number] Patient Account Number: [id number] Date of Birth/Sex: 1965/09/05 (51 y.o. Female) Treating RN: Carolyne Fiscal, Debi Primary Care Ricquel Foulk: Vic Blackbird Other Clinician: Referring Cherilynn Schomburg: Vic Blackbird Treating Camary Sosa/Extender: Frann Rider in Treatment: 5 Vital Signs Time Taken: 10:49 Temperature (F): 97.6 Height (in): 64 Pulse (bpm): 71 Weight (lbs): 152.6 Respiratory Rate (breaths/min): 16 Body Mass Index (BMI): 26.2 Blood Pressure (mmHg): 113/66 Reference Range: 80 - 120 mg / dl Electronic Signature(s) Signed: 05/14/2016 4:36:14 PM By: Alric Quan Entered By: Alric Quan on 05/14/2016 10:50:09

## 2016-06-11 MED FILL — RHOFADE 1% CREAM: 1 | 30 days supply | Qty: 30 | Fill #1

## 2016-06-11 MED FILL — LIALDA 1.2 GM TABLET SA: 1.2 | 30 days supply | Qty: 120 | Fill #4

## 2016-06-11 MED FILL — SIMPONI 100 MG/ML SOAJ: 100 | 28 days supply | Qty: 1 | Fill #8

## 2016-06-11 MED FILL — NORETHIN-ESTRAD-FERR 1-0.02: 1-20 | 84 days supply | Qty: 84 | Fill #1

## 2016-07-13 MED FILL — SIMPONI 100 MG/ML SOAJ: 100 | 28 days supply | Qty: 1 | Fill #9

## 2016-07-13 MED FILL — SERTRALINE HCL 50 MG TABLET: 50 | 90 days supply | Qty: 90 | Fill #2

## 2016-07-13 MED FILL — LIALDA 1.2 GM TABLET SA: 1.2 | 30 days supply | Qty: 120 | Fill #5

## 2016-07-22 DIAGNOSIS — F419 Anxiety disorder, unspecified: Secondary | ICD-10-CM | POA: Diagnosis not present

## 2016-08-04 ENCOUNTER — Encounter: Payer: Self-pay | Admitting: Family Medicine

## 2016-08-04 ENCOUNTER — Ambulatory Visit (INDEPENDENT_AMBULATORY_CARE_PROVIDER_SITE_OTHER): Payer: 59 | Admitting: Family Medicine

## 2016-08-04 VITALS — BP 108/70 | HR 78 | Temp 97.2°F | Resp 14 | Ht 64.0 in | Wt 162.0 lb

## 2016-08-04 DIAGNOSIS — M722 Plantar fascial fibromatosis: Secondary | ICD-10-CM | POA: Diagnosis not present

## 2016-08-04 DIAGNOSIS — J0100 Acute maxillary sinusitis, unspecified: Secondary | ICD-10-CM

## 2016-08-04 MED ORDER — AMOXICILLIN 875 MG PO TABS: 875.0000 mg | ORAL_TABLET | Freq: Two times a day (BID) | ORAL | 0 refills | Status: DC

## 2016-08-04 MED FILL — AMOXICILLIN 875 MG TABLET: 875 | 7 days supply | Qty: 14 | Fill #0

## 2016-08-04 NOTE — Progress Notes (Signed)
   Subjective:    Patient ID: [name], female    DOB: Dec 01, 1965, 51 y.o.   MRN: [id number]  Patient presents for R Foot Plantar Fascitis (R heel pain- worse in the AM and when she stands up- throbbing pain at night- using brace at night) and Illness (x2 weeks- began as allergies- now has sinus pressure and greeen nasal draiange)   Sinus pressure draiange for past 2 weeks. Initially clear drainage with allergies, no improvement with zyrtec. No fever, no cough  Right heel pain for past few months, started with [facility] therapy, does treadmill. Pain worst in morning when she puts foot down, has aching throughout the day. She has done ice, STRETCHING, inserts, few NSAIDS due to her UC for past month or so but not improving   Weight gain with zoloft, makes her eat more, was on celexa but hot flashes were worst,. GYN switched her to zoloft which helps the hot flashes but weight started packing on  Review Of Systems:  GEN- denies fatigue, fever, weight loss,weakness, recent illness HEENT- denies eye drainage, change in vision, +nasal discharge, CVS- denies chest pain, palpitations RESP- denies SOB, cough, wheeze ABD- denies N/V, change in stools, abd pain GU- denies dysuria, hematuria, dribbling, incontinence MSK- + joint pain, muscle aches, injury Neuro- denies headache, dizziness, syncope, seizure activity       Objective:    BP 108/70   Pulse 78   Temp 97.2 F (36.2 C) (Oral)   Resp 14   Ht 5' 4"  (1.626 m)   Wt 162 lb (73.5 kg)   SpO2 98%   BMI 27.81 kg/m  GEN- NAD, alert and oriented x3 HEENT- PERRL, EOMI, non injected sclera, pink conjunctiva, MMM, oropharynxclear, TM clear bilat no effusion,  + maxillary sinus tenderness,+  Nasal drainage  Neck- Supple, no LAD CVS- RRR, no murmur RESP-CTAB EXT- No edema MSK- TTP Right heel and at plantar insertion ,FROM ankle, knee, normal appearance to foot  Pulses- Radial 2+         Assessment & Plan:      Problem List  Items Addressed This Visit    None    Visit Diagnoses    Acute non-recurrent maxillary sinusitis    -  Primary   Treat with amox, nasal saline, allergy meds   Relevant Medications   amoxicillin (AMOXIL) 875 MG tablet   Plantar fasciitis       She has failed more consertaive therapy, recommend podiatry which she will call for, maybeneift from injection since she can not take NSAIDS by mouth for any prolonged period of time      Note: This dictation was prepared with Dragon dictation along with smaller phrase technology. Any transcriptional errors that result from this process are unintentional.

## 2016-08-04 NOTE — Patient Instructions (Signed)
F/U as needed

## 2016-08-05 DIAGNOSIS — F419 Anxiety disorder, unspecified: Secondary | ICD-10-CM | POA: Diagnosis not present

## 2016-08-14 ENCOUNTER — Other Ambulatory Visit: Payer: Self-pay | Admitting: Internal Medicine

## 2016-08-14 MED FILL — SIMPONI 100 MG/ML SOAJ: 100 | 28 days supply | Qty: 1 | Fill #10

## 2016-08-14 MED FILL — LIALDA 1.2 GM TABLET SA: 1.2 | 30 days supply | Qty: 120 | Fill #0

## 2016-08-27 ENCOUNTER — Ambulatory Visit (INDEPENDENT_AMBULATORY_CARE_PROVIDER_SITE_OTHER): Payer: 59

## 2016-08-27 ENCOUNTER — Ambulatory Visit (INDEPENDENT_AMBULATORY_CARE_PROVIDER_SITE_OTHER): Payer: 59 | Admitting: Podiatry

## 2016-08-27 ENCOUNTER — Encounter: Payer: Self-pay | Admitting: Podiatry

## 2016-08-27 DIAGNOSIS — M722 Plantar fascial fibromatosis: Secondary | ICD-10-CM | POA: Diagnosis not present

## 2016-08-27 DIAGNOSIS — M7661 Achilles tendinitis, right leg: Secondary | ICD-10-CM | POA: Diagnosis not present

## 2016-08-27 MED ORDER — METHYLPREDNISOLONE 4 MG PO TBPK: ORAL_TABLET | ORAL | 0 refills | Status: DC

## 2016-08-27 MED FILL — METHYLPREDNISOLONE 4 MG TAB: 4 | 6 days supply | Qty: 21 | Fill #0

## 2016-08-27 NOTE — Patient Instructions (Signed)

## 2016-08-27 NOTE — Progress Notes (Signed)
   Subjective:    Patient ID: [name], female    DOB: 06-27-1965, 51 y.o.   MRN: [id number]  HPI: She presents today with right posterior inferior heel pain since that is sharp and burning since 2018 noticing pain around the ankle radiating into the calf into the hip. States that she was once an active runner but now only walks. 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 She's tried over-the-counter inserts I's night splint not things made it better.    Review of Systems  Constitutional: Positive for activity change.  Musculoskeletal: Positive for gait problem.  All other systems reviewed and are negative.      Objective:   Physical Exam: Vital signs are stable alert and oriented 3. Pulses are palpable. Neurologic sensorium is intact. The tendon reflexes are intact. Muscle strength is 5 over 5 dorsiflexion plantar flexors and inverters and everters all inverters and intrinsic musculature is intact. Orthopedic evaluation demonstrates all joints distal to the ankle for range of motion without crepitation. Cutaneous evaluation of a straight supple hydrated cutis no erythema edema cellulitis drainage or odor. She is pain on palpation medial calcaneal tubercle right heel. Mild tenderness on palpation of the Achilles. Radiographs demonstrates soft tissue increase in density of plantar fascia insertion site small calcaneal heel spurs          Assessment & Plan:  Plantar fasciitis with some compensatory Achilles tendinitis.  Plan: I injected the plantar heel right foot with Kenalog and local anesthetic. Start her on a Medrol Dosepak to be followed by meloxicam. Also start her plantar fascia brace encouraged appropriate shoe gear also encouraged her to wear her night splint which she already has.

## 2016-09-08 MED FILL — NORETHIN-ESTRAD-FERR 1-0.02: 1-20 | 84 days supply | Qty: 84 | Fill #2

## 2016-09-14 DIAGNOSIS — H5203 Hypermetropia, bilateral: Secondary | ICD-10-CM | POA: Diagnosis not present

## 2016-09-15 ENCOUNTER — Telehealth: Payer: Self-pay | Admitting: Internal Medicine

## 2016-09-15 MED ORDER — MESALAMINE 1.2 G PO TBEC: DELAYED_RELEASE_TABLET | ORAL | 0 refills | Status: DC

## 2016-09-15 MED FILL — LIALDA 1.2 GM TABLET SA: 1.2 | 30 days supply | Qty: 120 | Fill #0

## 2016-09-15 NOTE — Telephone Encounter (Signed)
Rx sent 

## 2016-09-17 ENCOUNTER — Other Ambulatory Visit: Payer: Self-pay | Admitting: Internal Medicine

## 2016-09-18 ENCOUNTER — Other Ambulatory Visit: Payer: Self-pay | Admitting: Pharmacist

## 2016-09-18 ENCOUNTER — Other Ambulatory Visit: Payer: Self-pay

## 2016-09-18 MED ORDER — GOLIMUMAB 100 MG/ML ~~LOC~~ SOAJ: SUBCUTANEOUS | 0 refills | Status: DC

## 2016-09-18 MED ORDER — GOLIMUMAB 100 MG/ML ~~LOC~~ SOAJ: 100.0000 mg | SUBCUTANEOUS | 11 refills | Status: DC

## 2016-09-18 MED ORDER — GOLIMUMAB 100 MG/ML ~~LOC~~ SOAJ
100.0000 mg | SUBCUTANEOUS | 11 refills | Status: DC
Start: 2016-09-18 — End: 2016-09-18

## 2016-09-18 MED FILL — SIMPONI 100 MG/ML SOAJ: 100 | 28 days supply | Qty: 1 | Fill #0

## 2016-10-01 ENCOUNTER — Ambulatory Visit: Payer: 59 | Admitting: Podiatry

## 2016-10-08 ENCOUNTER — Ambulatory Visit (INDEPENDENT_AMBULATORY_CARE_PROVIDER_SITE_OTHER): Payer: 59 | Admitting: Podiatry

## 2016-10-08 ENCOUNTER — Encounter: Payer: Self-pay | Admitting: Podiatry

## 2016-10-08 DIAGNOSIS — M722 Plantar fascial fibromatosis: Secondary | ICD-10-CM | POA: Diagnosis not present

## 2016-10-09 ENCOUNTER — Ambulatory Visit: Payer: 59 | Admitting: Internal Medicine

## 2016-10-09 NOTE — Progress Notes (Signed)
She presents today for follow-up of her plantar fasciitis of her right foot. She states that she is doing much better than she was last time at least she can walk on the foot without pain. She continues to take her medication she continues to utilize her night splint and plantar fascial brace.  Objective: Vital signs are stable alert and oriented 3 pulses are palpable. No calf pain. She still has some tenderness on deep palpation of the plantar fascia at its insertion site on the right foot.  Assessment: Plantar fasciitis right resolving.  Plan: Recommended another injection which was performed today with Kenalog and local anesthetic I also recommended that she continue all other conservative therapies and I will follow-up with her in 1 month if necessary. We may need to consider orthotics at that point.  Roselind Messier DPM

## 2016-10-12 ENCOUNTER — Telehealth: Payer: Self-pay | Admitting: Internal Medicine

## 2016-10-13 MED ORDER — MESALAMINE 1.2 G PO TBEC: DELAYED_RELEASE_TABLET | ORAL | 1 refills | Status: DC

## 2016-10-13 NOTE — Telephone Encounter (Signed)
Rx sent 

## 2016-10-14 DIAGNOSIS — N951 Menopausal and female climacteric states: Secondary | ICD-10-CM | POA: Diagnosis not present

## 2016-10-14 DIAGNOSIS — R635 Abnormal weight gain: Secondary | ICD-10-CM | POA: Diagnosis not present

## 2016-10-16 ENCOUNTER — Other Ambulatory Visit: Payer: Self-pay | Admitting: *Deleted

## 2016-10-16 MED ORDER — MESALAMINE 1.2 G PO TBEC: DELAYED_RELEASE_TABLET | ORAL | 1 refills | Status: DC

## 2016-10-16 MED FILL — LIALDA 1.2 GM TABLET SA: 1.2 | 30 days supply | Qty: 120 | Fill #0

## 2016-10-19 MED FILL — SIMPONI 100 MG/ML SOAJ: 100 | 28 days supply | Qty: 1 | Fill #1

## 2016-10-20 DIAGNOSIS — E663 Overweight: Secondary | ICD-10-CM | POA: Diagnosis not present

## 2016-10-20 DIAGNOSIS — E782 Mixed hyperlipidemia: Secondary | ICD-10-CM | POA: Diagnosis not present

## 2016-10-20 DIAGNOSIS — E559 Vitamin D deficiency, unspecified: Secondary | ICD-10-CM | POA: Diagnosis not present

## 2016-10-20 DIAGNOSIS — N951 Menopausal and female climacteric states: Secondary | ICD-10-CM | POA: Diagnosis not present

## 2016-10-26 DIAGNOSIS — E663 Overweight: Secondary | ICD-10-CM | POA: Diagnosis not present

## 2016-10-26 DIAGNOSIS — E782 Mixed hyperlipidemia: Secondary | ICD-10-CM | POA: Diagnosis not present

## 2016-11-02 DIAGNOSIS — E559 Vitamin D deficiency, unspecified: Secondary | ICD-10-CM | POA: Diagnosis not present

## 2016-11-02 DIAGNOSIS — E663 Overweight: Secondary | ICD-10-CM | POA: Diagnosis not present

## 2016-11-09 DIAGNOSIS — E663 Overweight: Secondary | ICD-10-CM | POA: Diagnosis not present

## 2016-11-09 DIAGNOSIS — E782 Mixed hyperlipidemia: Secondary | ICD-10-CM | POA: Diagnosis not present

## 2016-11-09 DIAGNOSIS — E559 Vitamin D deficiency, unspecified: Secondary | ICD-10-CM | POA: Diagnosis not present

## 2016-11-10 MED FILL — SIMPONI 100 MG/ML SOAJ: 100 | 28 days supply | Qty: 1 | Fill #2

## 2016-11-17 DIAGNOSIS — E559 Vitamin D deficiency, unspecified: Secondary | ICD-10-CM | POA: Diagnosis not present

## 2016-11-17 DIAGNOSIS — E782 Mixed hyperlipidemia: Secondary | ICD-10-CM | POA: Diagnosis not present

## 2016-11-17 DIAGNOSIS — E663 Overweight: Secondary | ICD-10-CM | POA: Diagnosis not present

## 2016-11-18 ENCOUNTER — Ambulatory Visit: Payer: 59 | Admitting: Internal Medicine

## 2016-11-23 DIAGNOSIS — E559 Vitamin D deficiency, unspecified: Secondary | ICD-10-CM | POA: Diagnosis not present

## 2016-11-23 DIAGNOSIS — E782 Mixed hyperlipidemia: Secondary | ICD-10-CM | POA: Diagnosis not present

## 2016-11-23 DIAGNOSIS — E663 Overweight: Secondary | ICD-10-CM | POA: Diagnosis not present

## 2016-11-24 MED FILL — NORETHIN-ESTRAD-FERR 1-0.02: 1-20 | 84 days supply | Qty: 84 | Fill #3

## 2016-11-24 MED FILL — SERTRALINE HCL 50 MG TABLET: 50 | 90 days supply | Qty: 90 | Fill #3

## 2016-12-07 DIAGNOSIS — E782 Mixed hyperlipidemia: Secondary | ICD-10-CM | POA: Diagnosis not present

## 2016-12-07 DIAGNOSIS — E663 Overweight: Secondary | ICD-10-CM | POA: Diagnosis not present

## 2016-12-07 DIAGNOSIS — E559 Vitamin D deficiency, unspecified: Secondary | ICD-10-CM | POA: Diagnosis not present

## 2016-12-09 ENCOUNTER — Encounter: Payer: Self-pay | Admitting: Internal Medicine

## 2016-12-09 ENCOUNTER — Ambulatory Visit (INDEPENDENT_AMBULATORY_CARE_PROVIDER_SITE_OTHER): Payer: 59 | Admitting: Internal Medicine

## 2016-12-09 DIAGNOSIS — K5150 Left sided colitis without complications: Secondary | ICD-10-CM | POA: Diagnosis not present

## 2016-12-09 DIAGNOSIS — L88 Pyoderma gangrenosum: Secondary | ICD-10-CM

## 2016-12-09 MED ORDER — MESALAMINE 1.2 G PO TBEC: DELAYED_RELEASE_TABLET | ORAL | 2 refills | Status: DC

## 2016-12-09 MED FILL — LIALDA 1.2 GM TABLET SA: 1.2 | 30 days supply | Qty: 120 | Fill #0

## 2016-12-09 MED FILL — SIMPONI 100 MG/ML SOAJ: 100 | 28 days supply | Qty: 1 | Fill #3

## 2016-12-09 NOTE — Patient Instructions (Signed)
You will be due for a recall colonoscopy in 08/2017. We will send you a reminder in the mail when it gets closer to that time.  Please continue Simponi.  Please decrease your Lialda to 2.4 grams daily.  If you are age 49 or older, your body mass index should be between 23-30. Your There is no height or weight on file to calculate BMI. If this is out of the aforementioned range listed, please consider follow up with your Primary Care Provider.  If you are age 7 or younger, your body mass index should be between 19-25. Your There is no height or weight on file to calculate BMI. If this is out of the aformentioned range listed, please consider follow up with your Primary Care Provider.

## 2016-12-09 NOTE — Progress Notes (Signed)
   Subjective:    Patient ID: [name], female    DOB: 1965-09-17, 51 y.o.   MRN: [id number]  HPI [name] is a 51 year old female with a history of chronic long-standing left-sided ulcerative colitis, recent treatment for left lower extremity pyoderma gangrenosum who is here for follow-up.  She was last seen in the office on 02/03/2016. She has been maintained on Lialda 4.8 g daily and Simponi 100 mg every 28 days.  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 She is very happy with her current therapy and feels "like I don't even have colitis". Her bowel movements are regular without blood or melena. No abdominal pain. No nausea or vomiting. GI or hepatobiliary complaint.  She did develop a circular spot on her left lateral lower extremity initially felt to be a spider bite. She was treated with doxycycline and then developed an allergic reaction to this medication. This lesion increased in size and she was seen both by Dr. Allyson Sabal with dermatology for biopsy and then eventually wound care and wound management. She was diagnosed with pyoderma gangrenosum and this was treated at some point by debridement. It does not sound like she was ever given topical steroids.  This lesion was considerably painful for her but has now healed. There is no further or new lesions and no pain. She does have a scar in this location.  She received her flu shot yesterday  Review of Systems As per history of present illness, otherwise negative  Current Medications, Allergies, Past Medical History, Past Surgical History, Family History and Social History were reviewed in [employer] record.     Objective:   Physical Exam There were no vitals taken for this visit. Constitutional: Well-developed and well-nourished. No distress. HEENT: Normocephalic and atraumatic. Oropharynx is clear and moist. Conjunctivae are normal.  No scleral icterus. Neck: Neck supple. Trachea  midline. Cardiovascular: Normal rate, regular rhythm and intact distal pulses. No M/R/G Pulmonary/chest: Effort normal and breath sounds normal. No wheezing, rales or rhonchi. Abdominal: Soft, nontender, nondistended. Bowel sounds active throughout. There are no masses palpable. No hepatosplenomegaly. Extremities: no clubbing, cyanosis, or edema Neurological: Alert and oriented to person place and time. Skin: Skin is warm and dry, On the left lateral lower extremity there is an approximate 4 cm flat slightly depressed scar with hyperpigmentation which is nontender; no other lesions on bilateral lower or upper extremities Psychiatric: Normal mood and affect. Behavior is normal.      Assessment & Plan:  51 year old female with a history of chronic long-standing left-sided ulcerative colitis, recent treatment for left lower extremity pyoderma gangrenosum who is here for follow-up.   1. Left sided ulcerative colitis -- in clinical remission on Simponi and Lialda. Will decrease Lialda to 2.4 g daily and continue Simponi 100 mg every 28 days. Repeat surveillance colonoscopy recommended in June 2019. --Patient needs quantiferon gold and will have labs performed in the coming weeks with Dr. Garwin Brothers, her gynecologist. She will have her quantiferon gold test performed through Dr. Garwin Brothers. This will be available in Epic --Surveillance colonoscopy June 2019 --Up-to-date with influenza vaccination  2. Pyoderma gangrenosum -- now resolved after wound management.I asked that she notify me immediately if she has any recurrent lesions as this would likely be best treated with topical steroid, likely with dermatology involvement  25 minutes spent with the patient today. Greater than 50% was spent in counseling and coordination of care with the patient

## 2016-12-14 DIAGNOSIS — E663 Overweight: Secondary | ICD-10-CM | POA: Diagnosis not present

## 2016-12-14 DIAGNOSIS — R790 Abnormal level of blood mineral: Secondary | ICD-10-CM | POA: Diagnosis not present

## 2016-12-14 DIAGNOSIS — E611 Iron deficiency: Secondary | ICD-10-CM | POA: Diagnosis not present

## 2016-12-14 DIAGNOSIS — E559 Vitamin D deficiency, unspecified: Secondary | ICD-10-CM | POA: Diagnosis not present

## 2016-12-21 DIAGNOSIS — E663 Overweight: Secondary | ICD-10-CM | POA: Diagnosis not present

## 2016-12-21 DIAGNOSIS — E559 Vitamin D deficiency, unspecified: Secondary | ICD-10-CM | POA: Diagnosis not present

## 2016-12-28 ENCOUNTER — Other Ambulatory Visit: Payer: Self-pay | Admitting: Obstetrics and Gynecology

## 2016-12-28 DIAGNOSIS — Z1231 Encounter for screening mammogram for malignant neoplasm of breast: Secondary | ICD-10-CM

## 2016-12-28 DIAGNOSIS — E559 Vitamin D deficiency, unspecified: Secondary | ICD-10-CM | POA: Diagnosis not present

## 2016-12-28 DIAGNOSIS — E663 Overweight: Secondary | ICD-10-CM | POA: Diagnosis not present

## 2017-01-04 DIAGNOSIS — E782 Mixed hyperlipidemia: Secondary | ICD-10-CM | POA: Diagnosis not present

## 2017-01-04 DIAGNOSIS — E611 Iron deficiency: Secondary | ICD-10-CM | POA: Diagnosis not present

## 2017-01-04 DIAGNOSIS — E663 Overweight: Secondary | ICD-10-CM | POA: Diagnosis not present

## 2017-01-04 DIAGNOSIS — E559 Vitamin D deficiency, unspecified: Secondary | ICD-10-CM | POA: Diagnosis not present

## 2017-01-11 DIAGNOSIS — E559 Vitamin D deficiency, unspecified: Secondary | ICD-10-CM | POA: Diagnosis not present

## 2017-01-11 DIAGNOSIS — R790 Abnormal level of blood mineral: Secondary | ICD-10-CM | POA: Diagnosis not present

## 2017-01-11 DIAGNOSIS — E663 Overweight: Secondary | ICD-10-CM | POA: Diagnosis not present

## 2017-01-12 MED FILL — LIALDA 1.2 GM TABLET SA: 1.2 | 30 days supply | Qty: 120 | Fill #1

## 2017-01-12 MED FILL — SIMPONI 100 MG/ML SOAJ: 100 | 28 days supply | Qty: 1 | Fill #4

## 2017-01-14 ENCOUNTER — Ambulatory Visit
Admission: RE | Admit: 2017-01-14 | Discharge: 2017-01-14 | Disposition: A | Discharge status: Home / Self Care | Payer: 59 | Source: Ambulatory Visit | Attending: Obstetrics and Gynecology | Admitting: Obstetrics and Gynecology

## 2017-01-14 DIAGNOSIS — Z1231 Encounter for screening mammogram for malignant neoplasm of breast: Secondary | ICD-10-CM | POA: Diagnosis not present

## 2017-01-25 DIAGNOSIS — E663 Overweight: Secondary | ICD-10-CM | POA: Diagnosis not present

## 2017-01-25 DIAGNOSIS — E559 Vitamin D deficiency, unspecified: Secondary | ICD-10-CM | POA: Diagnosis not present

## 2017-01-25 DIAGNOSIS — R790 Abnormal level of blood mineral: Secondary | ICD-10-CM | POA: Diagnosis not present

## 2017-02-08 DIAGNOSIS — E663 Overweight: Secondary | ICD-10-CM | POA: Diagnosis not present

## 2017-02-08 DIAGNOSIS — R790 Abnormal level of blood mineral: Secondary | ICD-10-CM | POA: Diagnosis not present

## 2017-02-09 MED FILL — SIMPONI 100 MG/ML SOAJ: 100 | 28 days supply | Qty: 1 | Fill #5

## 2017-02-09 MED FILL — LIALDA 1.2 GM TABLET SA: 1.2 | 30 days supply | Qty: 120 | Fill #2

## 2017-02-17 DIAGNOSIS — Z1151 Encounter for screening for human papillomavirus (HPV): Secondary | ICD-10-CM | POA: Diagnosis not present

## 2017-02-17 DIAGNOSIS — Z6825 Body mass index (BMI) 25.0-25.9, adult: Secondary | ICD-10-CM | POA: Diagnosis not present

## 2017-02-17 DIAGNOSIS — Z01419 Encounter for gynecological examination (general) (routine) without abnormal findings: Secondary | ICD-10-CM | POA: Diagnosis not present

## 2017-02-17 DIAGNOSIS — Z0000 Encounter for general adult medical examination without abnormal findings: Secondary | ICD-10-CM | POA: Diagnosis not present

## 2017-02-17 DIAGNOSIS — Z13220 Encounter for screening for lipoid disorders: Secondary | ICD-10-CM | POA: Diagnosis not present

## 2017-02-17 DIAGNOSIS — Z1329 Encounter for screening for other suspected endocrine disorder: Secondary | ICD-10-CM | POA: Diagnosis not present

## 2017-02-17 DIAGNOSIS — Z131 Encounter for screening for diabetes mellitus: Secondary | ICD-10-CM | POA: Diagnosis not present

## 2017-02-17 DIAGNOSIS — Z130 Encounter for screening for diseases of the blood and blood-forming organs and certain disorders involving the immune mechanism: Secondary | ICD-10-CM | POA: Diagnosis not present

## 2017-02-17 LAB — HM PAP SMEAR

## 2017-03-01 DIAGNOSIS — E782 Mixed hyperlipidemia: Secondary | ICD-10-CM | POA: Diagnosis not present

## 2017-03-01 DIAGNOSIS — E663 Overweight: Secondary | ICD-10-CM | POA: Diagnosis not present

## 2017-03-01 MED FILL — LARIN FE 1-20 TABLET: 1-20 | 84 days supply | Qty: 84 | Fill #0

## 2017-03-01 MED FILL — SERTRALINE HCL 50 MG TABLET: 50 | 90 days supply | Qty: 90 | Fill #0

## 2017-03-11 MED FILL — LIALDA 1.2 GM TABLET SA: 1.2 | 30 days supply | Qty: 120 | Fill #1

## 2017-03-11 MED FILL — SIMPONI 100 MG/ML SOAJ: 100 | 28 days supply | Qty: 1 | Fill #6

## 2017-04-13 ENCOUNTER — Other Ambulatory Visit: Payer: Self-pay | Admitting: Internal Medicine

## 2017-04-13 MED FILL — SIMPONI 100 MG/ML SOAJ: 100 | 28 days supply | Qty: 1 | Fill #7

## 2017-04-14 ENCOUNTER — Telehealth: Payer: Self-pay | Admitting: Internal Medicine

## 2017-04-14 MED ORDER — MESALAMINE 1.2 G PO TBEC: 2.4000 g | DELAYED_RELEASE_TABLET | Freq: Every day | ORAL | 2 refills | Status: DC

## 2017-04-14 MED FILL — LIALDA 1.2 GM TABLET SA: 1.2 | 30 days supply | Qty: 60 | Fill #0

## 2017-04-14 NOTE — Telephone Encounter (Signed)
Confirmed with [name] that per Dr Vena Rua last office note, patient should decrease to 2.4 grams of Lialda daily. The script sent yesterday had 2 sets of directions sent in error. [name] verbalizes understanding and a new corrected script was sent to pharmacy.

## 2017-05-13 ENCOUNTER — Other Ambulatory Visit: Payer: Self-pay

## 2017-05-13 ENCOUNTER — Encounter: Payer: Self-pay | Admitting: Physician Assistant

## 2017-05-13 ENCOUNTER — Ambulatory Visit (INDEPENDENT_AMBULATORY_CARE_PROVIDER_SITE_OTHER): Payer: 59 | Admitting: Physician Assistant

## 2017-05-13 VITALS — BP 114/76 | HR 65 | Temp 98.0°F | Resp 16 | Wt 157.8 lb

## 2017-05-13 DIAGNOSIS — J0100 Acute maxillary sinusitis, unspecified: Secondary | ICD-10-CM | POA: Diagnosis not present

## 2017-05-13 DIAGNOSIS — B9689 Other specified bacterial agents as the cause of diseases classified elsewhere: Secondary | ICD-10-CM

## 2017-05-13 DIAGNOSIS — J988 Other specified respiratory disorders: Secondary | ICD-10-CM

## 2017-05-13 MED ORDER — AMOXICILLIN 875 MG PO TABS: 875.0000 mg | ORAL_TABLET | Freq: Two times a day (BID) | ORAL | 0 refills | Status: DC

## 2017-05-13 MED FILL — AMOXICILLIN 875 MG TABLET: 875 | 10 days supply | Qty: 20 | Fill #0

## 2017-05-13 NOTE — Progress Notes (Signed)
    Patient ID: [name] MRN: [id number], DOB: December 04, 1965, 52 y.o. Date of Encounter: 05/13/2017, 12:27 PM    Chief Complaint:  Chief Complaint  Patient presents with  . Headache    x1week  . pressure in face    x1week     HPI: 52 y.o. year old female presents with above.   Reports that last week she thought she may just have allergies and used medications for allergies.  However symptoms have persisted and worsened.  Getting thick dark mucus from nose.  Having some pressure in right maxillary sinus and pressure across forehead.  Has had no significant sore throat.  No fevers or chills.  No chest congestion or cough.     Home Meds:   Outpatient Medications Prior to Visit  Medication Sig Dispense Refill  . Golimumab 100 MG/ML SOAJ Inject 100 mg into the skin every 28 (twenty-eight) days. 1 Syringe 11  . mesalamine (LIALDA) 1.2 g EC tablet Take 2 tablets (2.4 g total) by mouth daily with breakfast. 60 tablet 2  . Multiple Vitamin (MULTIVITAMIN) tablet Take 1 tablet by mouth daily.    . norgestrel-ethinyl estradiol (LO/OVRAL,CRYSELLE) 0.3-30 MG-MCG tablet Take 1 tablet by mouth daily.    . sertraline (ZOLOFT) 50 MG tablet Take 50 mg by mouth daily.     No facility-administered medications prior to visit.     Allergies:  Allergies  Allergen Reactions  . Doxycycline Rash      Review of Systems: See HPI for pertinent ROS. All other ROS negative.    Physical Exam: Blood pressure 114/76, pulse 65, temperature 98 F (36.7 C), temperature source Oral, resp. rate 16, weight 71.6 kg (157 lb 12.8 oz), SpO2 98 %., Body mass index is 27.09 kg/m. General: WNWD WF.  Appears in no acute distress. HEENT: Normocephalic, atraumatic, eyes without discharge, sclera non-icteric, nares are without discharge. Bilateral auditory canals clear, TM's are without perforation, pearly grey and translucent with reflective cone of light bilaterally. Oral cavity moist, posterior pharynx without  exudate, erythema, peritonsillar abscess.  She has mild tenderness with percussion to right maxillary sinus.  No tenderness with percussion to left maxillary sinus.  No tenderness with percussion to frontal sinuses.  Neck: Supple. No thyromegaly. No lymphadenopathy. Lungs: Clear bilaterally to auscultation without wheezes, rales, or rhonchi. Breathing is unlabored. Heart: Regular rhythm. No murmurs, rubs, or gallops. Msk:  Strength and tone normal for age. Extremities/Skin: Warm and dry.  Neuro: Alert and oriented X 3. Moves all extremities spontaneously. Gait is normal. CNII-XII grossly in tact. Psych:  Responds to questions appropriately with a normal affect.     ASSESSMENT AND PLAN:  52 y.o. year old female with  1. Bacterial respiratory infection I reviewed that last time she had sinus infection she saw Dr. Buelah Manis and was treated with amoxicillin.  She states that her symptoms did resolve with amoxicillin so will stick with this.  She is to take amoxicillin as directed.  Follow-up if symptoms do not resolve upon completion of this. - amoxicillin (AMOXIL) 875 MG tablet; Take 1 tablet (875 mg total) by mouth 2 (two) times daily.  Dispense: 20 tablet; Refill: 0  2. Acute maxillary sinusitis, recurrence not specified    Signed, [address] [place], [place], [facility] 05/13/2017 12:27 PM

## 2017-05-14 MED FILL — SIMPONI 100 MG/ML SOAJ: 100 | 28 days supply | Qty: 1 | Fill #8

## 2017-05-14 MED FILL — LIALDA 1.2 GM TABLET SA: 1.2 | 30 days supply | Qty: 60 | Fill #1

## 2017-05-14 MED FILL — LARIN FE 1-20 TABLET: 1-20 | 84 days supply | Qty: 84 | Fill #1

## 2017-06-09 MED FILL — SIMPONI 100 MG/ML SOAJ: 100 | 28 days supply | Qty: 1 | Fill #9

## 2017-06-09 MED FILL — SERTRALINE HCL 50 MG TABLET: 50 | 90 days supply | Qty: 90 | Fill #1

## 2017-06-09 MED FILL — LIALDA 1.2 GM TABLET SA: 1.2 | 30 days supply | Qty: 60 | Fill #2

## 2017-07-14 ENCOUNTER — Other Ambulatory Visit: Payer: Self-pay | Admitting: Internal Medicine

## 2017-07-14 MED FILL — LIALDA 1.2 GM TABLET SA: 1.2 | 30 days supply | Qty: 60 | Fill #0

## 2017-07-16 ENCOUNTER — Ambulatory Visit: Payer: 59 | Admitting: Family Medicine

## 2017-07-16 ENCOUNTER — Other Ambulatory Visit: Payer: Self-pay

## 2017-07-16 ENCOUNTER — Telehealth: Payer: Self-pay | Admitting: Internal Medicine

## 2017-07-16 ENCOUNTER — Encounter: Payer: Self-pay | Admitting: Family Medicine

## 2017-07-16 ENCOUNTER — Ambulatory Visit
Admission: RE | Admit: 2017-07-16 | Discharge: 2017-07-16 | Disposition: A | Discharge status: Home / Self Care | Payer: 59 | Source: Ambulatory Visit | Attending: Family Medicine | Admitting: Family Medicine

## 2017-07-16 VITALS — BP 108/82 | HR 60 | Temp 98.1°F | Resp 14 | Ht 64.0 in | Wt 156.4 lb

## 2017-07-16 DIAGNOSIS — J209 Acute bronchitis, unspecified: Secondary | ICD-10-CM

## 2017-07-16 DIAGNOSIS — R6889 Other general symptoms and signs: Secondary | ICD-10-CM

## 2017-07-16 DIAGNOSIS — R05 Cough: Secondary | ICD-10-CM | POA: Diagnosis not present

## 2017-07-16 DIAGNOSIS — J0100 Acute maxillary sinusitis, unspecified: Secondary | ICD-10-CM | POA: Diagnosis not present

## 2017-07-16 DIAGNOSIS — R0602 Shortness of breath: Secondary | ICD-10-CM

## 2017-07-16 LAB — INFLUENZA A AND B AG, IMMUNOASSAY
INFLUENZA A ANTIGEN: NOT DETECTED
INFLUENZA B ANTIGEN: NOT DETECTED

## 2017-07-16 MED ORDER — IPRATROPIUM-ALBUTEROL 0.5-2.5 (3) MG/3ML IN SOLN
3.0000 mL | Freq: Once | RESPIRATORY_TRACT | Status: AC
  Administered 2017-07-16: 3 mL via RESPIRATORY_TRACT

## 2017-07-16 MED ORDER — ALBUTEROL SULFATE 108 (90 BASE) MCG/ACT IN AEPB: 2.0000 | INHALATION_SPRAY | RESPIRATORY_TRACT | 0 refills | Status: DC | PRN

## 2017-07-16 MED ORDER — MONTELUKAST SODIUM 10 MG PO TABS: 10.0000 mg | ORAL_TABLET | Freq: Every day | ORAL | 1 refills | Status: DC

## 2017-07-16 MED ORDER — PREDNISONE 20 MG PO TABS: ORAL_TABLET | ORAL | 0 refills | Status: DC

## 2017-07-16 MED FILL — predniSONE 20 MG TABS: 20 | 6 days supply | Qty: 12 | Fill #0

## 2017-07-16 MED FILL — PROAIR RESPICLICK INHAL PWD: 108 (90 BAS | 17 days supply | Qty: 1 | Fill #0

## 2017-07-16 MED FILL — MONTELUKAST SOD 10 MG TAB: 10 | 30 days supply | Qty: 30 | Fill #0

## 2017-07-16 NOTE — Telephone Encounter (Signed)
Let pt know that we received a fax yesterday that the simponi needed a PA. Let pt know that this was initiated yesterday and it could take up to 72 hours. Will let pt know as soon as we hear back regarding the PA.

## 2017-07-16 NOTE — Patient Instructions (Signed)
Get CXR at [facility] steroids, use inhaler and add singulair to allergy meds.  I will call you with chest xray results.

## 2017-07-16 NOTE — Progress Notes (Signed)
Patient ID: [name], female    DOB: 1965-07-17, 52 y.o.   MRN: [id number]  PCP: Alycia Rossetti, MD  Chief Complaint  Patient presents with  . sinus drainage    symptoms for 3 weeks  . Cough  . Headache  . Shortness of Breath    When experiencing     Subjective:   [name] is a 52 y.o. female,  with history of ulcerative colitis, is on a Biologics and mesalamine, she is an ICU nurse, she presents with 3 weeks of URI symptoms including nasal congestion, nasal drainage, sinus headache, sore throats, productive cough with clear to yellow sputum, and generalized fatigue and malaise, has very elevated heart rate and shortness of breath when trying to do physical activities.  Cough is irritating she feels tightness and some burning up high in her chest.  She states she is drinking 2 bottles a cough medicine without any improvement, she is also started Claritin and Flonase.  When all of her symptoms first began 3 weeks ago these medications make her felt a little bit better for 4 days but then she had an acute worsening that has been gradually worsening for the past 1 to 2 weeks.  She also works in the ICU, had a patient recently that she worked with for several days who later tested positive for flu A. She also goes to a place called [employer] and has been doing these group exercise classes for long time and over the past several weeks her heart rate has been 180s, she been very short of breath and fatigued, unable to do as much exercise as she was previously.  Before getting ill her heart rate would elevate but only to about 140s.    Patient Active Problem List   Diagnosis Date Noted  . Pyoderma gangrenosum 12/09/2016  . RUQ abdominal pain 07/14/2013  . Nausea alone 07/14/2013  . LEFT SIDED ULCERATIVE COLITIS 08/15/2008     Prior to Admission medications   Medication Sig Start Date End Date Taking? Authorizing Provider  Golimumab 100 MG/ML SOAJ Inject 100 mg into the  skin every 28 (twenty-eight) days. 09/18/16  Yes Tresa Garter, MD  LIALDA 1.2 g EC tablet TAKE 2 TABLETS BY MOUTH DAILY WITH BREAKFAST 07/14/17  Yes Pyrtle, Lajuan Lines, MD  Multiple Vitamin (MULTIVITAMIN) tablet Take 1 tablet by mouth daily.   Yes [provider]  norgestrel-ethinyl estradiol (LO/OVRAL,CRYSELLE) 0.3-30 MG-MCG tablet Take 1 tablet by mouth daily.   Yes [provider]  sertraline (ZOLOFT) 50 MG tablet Take 50 mg by mouth daily.   Yes [provider]     Allergies  Allergen Reactions  . Doxycycline Rash     Family History  Problem Relation Age of Onset  . Macular degeneration Father   . Lung cancer Father 30  . Breast cancer Mother   . Hyperlipidemia Mother   . Glaucoma Mother   . Colon cancer Neg Hx      Social History   Socioeconomic History  . Marital status: Single    Spouse name: Not on file  . Number of children: 2  . Years of education: Not on file  . Highest education level: Not on file  Occupational History  . Occupation: Marine scientist, [employer]   Social Needs  . Financial resource strain: Not on file  . Food insecurity:    Worry: Not on file    Inability: Not on file  .  Transportation needs:    Medical: Not on file    Non-medical: Not on file  Tobacco Use  . Smoking status: Never Smoker  . Smokeless tobacco: Never Used  Substance and Sexual Activity  . Alcohol use: No  . Drug use: No  . Sexual activity: Not on file  Lifestyle  . Physical activity:    Days per week: Not on file    Minutes per session: Not on file  . Stress: Not on file  Relationships  . Social connections:    Talks on phone: Not on file    Gets together: Not on file    Attends religious service: Not on file    Active member of club or organization: Not on file    Attends meetings of clubs or organizations: Not on file    Relationship status: Not on file  . Intimate partner violence:    Fear of current or ex partner: Not on file     Emotionally abused: Not on file    Physically abused: Not on file    Forced sexual activity: Not on file  Other Topics Concern  . Not on file  Social History Narrative  . Not on file     Review of Systems  Constitutional: Positive for activity change and fatigue. Negative for appetite change, chills, diaphoresis, fever and unexpected weight change.  HENT: Positive for congestion, postnasal drip, rhinorrhea, sinus pressure, sinus pain and sneezing. Negative for ear pain, hearing loss, sore throat, tinnitus and voice change.   Eyes: Negative.   Respiratory: Positive for cough, chest tightness, shortness of breath and wheezing. Negative for apnea, choking and stridor.   Cardiovascular: Negative for chest pain, palpitations and leg swelling.  Gastrointestinal: Negative.   Endocrine: Negative.   Genitourinary: Negative.   Musculoskeletal: Positive for myalgias.  Skin: Negative.  Negative for color change, pallor, rash and wound.  Allergic/Immunologic: Positive for environmental allergies and immunocompromised state. Negative for food allergies.  Neurological: Positive for headaches. Negative for dizziness, syncope, weakness, light-headedness and numbness.  Hematological: Negative.   Psychiatric/Behavioral: Negative.   All other systems reviewed and are negative.      Objective:    Vitals:   07/16/17 1011  BP: 108/82  Pulse: 60  Resp: 14  Temp: 98.1 F (36.7 C)  TempSrc: Oral  SpO2: 98%  Weight: 156 lb 6.4 oz (70.9 kg)  Height: 5' 4"  (1.626 m)      Physical Exam  Constitutional: She is oriented to person, place, and time. She appears well-developed and well-nourished.  Non-toxic appearance. She does not appear ill. No distress.  Tired appearing female appears stated age, nontoxic appearing, mildly diaphoretic, no distress  HENT:  Head: Normocephalic and atraumatic.  Right Ear: External ear normal.  Left Ear: External ear normal.  Mouth/Throat: Uvula is midline and  mucous membranes are normal.  Mucous membranes moist Posterior oropharynx diffusely injected, no exudate, no edema Bilateral nasal mucosa edematous and erythematous, left nostril able to visualize to turbinates that are both enlarged and erythematous Very mild bilateral maxillary sinus tenderness to palpation, no frontal sinus tenderness to palpation No cervical lymphadenopathy  Eyes: Pupils are equal, round, and reactive to light. Conjunctivae, EOM and lids are normal. Right eye exhibits no discharge. Left eye exhibits no discharge. No scleral icterus.  Neck: Normal range of motion and phonation normal. Neck supple. No tracheal deviation present.  Cardiovascular: Normal rate, regular rhythm, normal heart sounds, intact distal pulses and normal pulses. Exam reveals no gallop  and no friction rub.  No murmur heard. Pulses:      Radial pulses are 2+ on the right side, and 2+ on the left side.       Posterior tibial pulses are 2+ on the right side, and 2+ on the left side.  No lower extremity edema  Pulmonary/Chest: Effort normal. No stridor. No respiratory distress. She has no wheezes. She has no rhonchi. She has no rales. She exhibits no tenderness.  Splinted inspiratory effort, shallow respirations, symmetrical chest expansion, no tachypnea, no retractions, diminished breath sounds bilaterally mid to lower lung fields, no wheeze rales rhonchi  Abdominal: Soft. Normal appearance and bowel sounds are normal. She exhibits no distension. There is no tenderness. There is no rebound and no guarding.  Musculoskeletal: Normal range of motion. She exhibits no edema or deformity.  Lymphadenopathy:    She has no cervical adenopathy.  Neurological: She is alert and oriented to person, place, and time. She exhibits normal muscle tone. Coordination and gait normal.  Skin: Skin is warm and intact. Capillary refill takes less than 2 seconds. No rash noted. She is not diaphoretic. No pallor.  Psychiatric: She  has a normal mood and affect. Her speech is normal and behavior is normal.  Nursing note and vitals reviewed.         Assessment & Plan:      ICD-10-CM   1. Acute bronchitis, unspecified organism J20.9 Albuterol Sulfate (PROAIR RESPICLICK) 191 (90 Base) MCG/ACT AEPB    predniSONE (DELTASONE) 20 MG tablet    DG Chest 2 View    ipratropium-albuterol (DUONEB) 0.5-2.5 (3) MG/3ML nebulizer solution 3 mL  2. Flu-like symptoms R68.89 Influenza A and B Ag, Immunoassay  3. 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    Immunocompromise exposed to multiple sick contacts at work, flu testing negative.  Chest x-ray to rule out pneumonia, patient wishes to avoid antibiotics because of her UC.  Will treat for bronchitis and increased management of sinusitis which may be allergy mediated, adding Singulair prednisone and albuterol.    Breath sounds improved after breathing treatment here in clinic.  She noted that shortness of breath and congestion in her chest felt improved after treatment as well.  Vital signs are reassuring.  Will follow up with chest x-ray results, low threshold to initiate antibiotics if patient does not improve I discussed this with her and she verbalizes understanding however she would like to try to avoid them and try treatment for acute bronchitis first.  Delsa Grana, PA-C 07/16/17 10:38 AM

## 2017-07-16 NOTE — Telephone Encounter (Signed)
Vaughan Basta- I think patient may be speaking of Simponi-

## 2017-07-20 MED FILL — SIMPONI 100 MG/ML SOAJ: 100 | 28 days supply | Qty: 1 | Fill #10

## 2017-08-03 ENCOUNTER — Encounter: Payer: Self-pay | Admitting: Internal Medicine

## 2017-08-12 MED FILL — NORETHIN-ESTRAD-FERR 1-0.02: 1-20 | 84 days supply | Qty: 84 | Fill #2

## 2017-08-16 MED FILL — LIALDA 1.2 GM TABLET SA: 1.2 | 30 days supply | Qty: 60 | Fill #1

## 2017-08-16 MED FILL — SIMPONI 100 MG/ML SOAJ: 100 | 28 days supply | Qty: 1 | Fill #11

## 2017-08-19 ENCOUNTER — Encounter: Payer: Self-pay | Admitting: Internal Medicine

## 2017-09-02 ENCOUNTER — Ambulatory Visit (INDEPENDENT_AMBULATORY_CARE_PROVIDER_SITE_OTHER): Payer: 59 | Admitting: Family Medicine

## 2017-09-02 ENCOUNTER — Encounter: Payer: Self-pay | Admitting: Family Medicine

## 2017-09-02 ENCOUNTER — Other Ambulatory Visit: Payer: Self-pay

## 2017-09-02 ENCOUNTER — Encounter: Payer: Self-pay | Admitting: *Deleted

## 2017-09-02 VITALS — BP 110/64 | HR 62 | Temp 98.1°F | Resp 12 | Ht 64.0 in | Wt 158.0 lb

## 2017-09-02 DIAGNOSIS — D899 Disorder involving the immune mechanism, unspecified: Secondary | ICD-10-CM

## 2017-09-02 DIAGNOSIS — E663 Overweight: Secondary | ICD-10-CM | POA: Diagnosis not present

## 2017-09-02 DIAGNOSIS — Z111 Encounter for screening for respiratory tuberculosis: Secondary | ICD-10-CM

## 2017-09-02 DIAGNOSIS — K5150 Left sided colitis without complications: Secondary | ICD-10-CM

## 2017-09-02 DIAGNOSIS — Z0000 Encounter for general adult medical examination without abnormal findings: Secondary | ICD-10-CM | POA: Diagnosis not present

## 2017-09-02 DIAGNOSIS — Z23 Encounter for immunization: Secondary | ICD-10-CM

## 2017-09-02 DIAGNOSIS — Z6828 Body mass index (BMI) 28.0-28.9, adult: Secondary | ICD-10-CM | POA: Insufficient documentation

## 2017-09-02 DIAGNOSIS — D849 Immunodeficiency, unspecified: Secondary | ICD-10-CM | POA: Diagnosis not present

## 2017-09-02 LAB — COMPLETE METABOLIC PANEL WITH GFR
AG RATIO: 1.6 (calc) (ref 1.0–2.5)
ALT: 7 U/L (ref 6–29)
AST: 14 U/L (ref 10–35)
Albumin: 4.5 g/dL (ref 3.6–5.1)
Alkaline phosphatase (APISO): 57 U/L (ref 33–130)
BUN: 14 mg/dL (ref 7–25)
CALCIUM: 9.6 mg/dL (ref 8.6–10.4)
CO2: 25 mmol/L (ref 20–32)
CREATININE: 0.77 mg/dL (ref 0.50–1.05)
Chloride: 103 mmol/L (ref 98–110)
GFR, EST AFRICAN AMERICAN: 104 mL/min/{1.73_m2} (ref >=60)
GFR, EST NON AFRICAN AMERICAN: 90 mL/min/{1.73_m2} (ref >=60)
GLUCOSE: 85 mg/dL (ref 65–99)
Globulin: 2.8 g/dL (calc) (ref 1.9–3.7)
Potassium: 4.4 mmol/L (ref 3.5–5.3)
Sodium: 138 mmol/L (ref 135–146)
TOTAL PROTEIN: 7.3 g/dL (ref 6.1–8.1)
Total Bilirubin: 0.6 mg/dL (ref 0.2–1.2)

## 2017-09-02 LAB — URINALYSIS, ROUTINE W REFLEX MICROSCOPIC
Bilirubin Urine: NEGATIVE
GLUCOSE, UA: NEGATIVE
HGB URINE DIPSTICK: NEGATIVE
Ketones, ur: NEGATIVE
Leukocytes, UA: NEGATIVE
Nitrite: NEGATIVE
PROTEIN: NEGATIVE
Specific Gravity, Urine: 1.025 (ref 1.001–1.03)
pH: 5.5 (ref 5.0–8.0)

## 2017-09-02 LAB — CBC WITH DIFFERENTIAL/PLATELET
BASOS PCT: 0.6 %
Basophils Absolute: 41 cells/uL (ref 0–200)
Eosinophils Absolute: 129 cells/uL (ref 15–500)
Eosinophils Relative: 1.9 %
HCT: 40.9 % (ref 35.0–45.0)
Hemoglobin: 13.9 g/dL (ref 11.7–15.5)
Lymphs Abs: 1591 cells/uL (ref 850–3900)
MCH: 30 pg (ref 27.0–33.0)
MCHC: 34 g/dL (ref 32.0–36.0)
MCV: 88.1 fL (ref 80.0–100.0)
MPV: 10.6 fL (ref 7.5–12.5)
Monocytes Relative: 7.6 %
Neutro Abs: 4522 cells/uL (ref 1500–7800)
Neutrophils Relative %: 66.5 %
Platelets: 308 10*3/uL (ref 140–400)
RBC: 4.64 10*6/uL (ref 3.80–5.10)
RDW: 12.2 % (ref 11.0–15.0)
TOTAL LYMPHOCYTE: 23.4 %
WBC: 6.8 10*3/uL (ref 3.8–10.8)
WBCMIX: 517 {cells}/uL (ref 200–950)

## 2017-09-02 LAB — LIPID PANEL
Cholesterol: 226 mg/dL — ABNORMAL HIGH (ref <200)
HDL: 53 mg/dL (ref >50)
LDL CHOLESTEROL (CALC): 140 mg/dL — AB
Non-HDL Cholesterol (Calc): 173 mg/dL (calc) — ABNORMAL HIGH (ref <130)
Total CHOL/HDL Ratio: 4.3 (calc) (ref <5.0)
Triglycerides: 194 mg/dL — ABNORMAL HIGH (ref <150)

## 2017-09-02 MED ORDER — ZOSTER VAC RECOMB ADJUVANTED 50 MCG/0.5ML IM SUSR: 0.5000 mL | Freq: Once | INTRAMUSCULAR | 0 refills | Status: AC

## 2017-09-02 NOTE — Progress Notes (Addendum)
Patient: [name], Female    DOB: 1965/06/21, 52 y.o.   MRN: [id number] Visit Date: 09/02/2017  Today's Provider: Delsa Grana, PA-C   Chief Complaint  Patient presents with  . CPE    is fasting   Subjective:    Annual physical exam [name] is a 52 y.o. female who presents today for health maintenance and complete physical. She feels well. She reports exercising 3-4 times a week, doing weightlifting classes and goes walking. She reports she is sleeping well.  ----------------------------------------------------------------- She has UC, managed by GI - she is due for quantiferon gold screening for TB annually for immunocompromised state.  GI will do a repeat colonoscopy in 2 months (does annual to biannual colonoscopy with GI).  She has hx of elevated cholesterol, and strong family hx of same, she reports that she has gained some weight and believes they will be elevated, but wants to work on diet and exercise to avoid statin.    Last labs in chart 03/03/00  total cholesterol 206, HDL 41, LDL 142, triglyc 100 Per [facility] report 06/24/15 total cholesterol 179, HDL 60, LDL 101, triglyc 88  She is not chronically on steroids, no hx of pre-diabetes.    Review of Systems  Constitutional: Negative.  Negative for activity change, appetite change, chills, diaphoresis, fatigue, fever and unexpected weight change.  HENT: Negative.   Eyes: Negative.   Respiratory: Negative.   Cardiovascular: Negative.   Gastrointestinal: Negative.   Endocrine: Negative.   Genitourinary: Negative.   Musculoskeletal: Negative.   Skin: Negative.  Negative for color change, pallor, rash and wound.       No skin or hair changes  Allergic/Immunologic: Negative.   Neurological: Negative.   Hematological: Negative.   Psychiatric/Behavioral: Negative.   All other systems reviewed and are negative.     Social History      She  reports that she has never smoked. She has never used smokeless tobacco.  She reports that she does not drink alcohol or use drugs.       Social History   Socioeconomic History  . Marital status: Single    Spouse name: Not on file  . Number of children: 2  . Years of education: Not on file  . Highest education level: Not on file  Occupational History  . Occupation: Marine scientist, [employer]   Social Needs  . Financial resource strain: Not on file  . Food insecurity:    Worry: Not on file    Inability: Not on file  . Transportation needs:    Medical: Not on file    Non-medical: Not on file  Tobacco Use  . Smoking status: Never Smoker  . Smokeless tobacco: Never Used  Substance and Sexual Activity  . Alcohol use: No  . Drug use: No  . Sexual activity: Not on file  Lifestyle  . Physical activity:    Days per week: Not on file    Minutes per session: Not on file  . Stress: Not on file  Relationships  . Social connections:    Talks on phone: Not on file    Gets together: Not on file    Attends religious service: Not on file    Active member of club or organization: Not on file    Attends meetings of clubs or organizations: Not on file    Relationship status: Not on file  Other Topics Concern  . Not on file  Social History Narrative  .  Not on file    Past Medical History:  Diagnosis Date  . DDD (degenerative disc disease), lumbar    S-I joints  . Superficial granulomatous pyoderma   . Ulcerative colitis      Patient Active Problem List   Diagnosis Date Noted  . Pyoderma gangrenosum 12/09/2016  . RUQ abdominal pain 07/14/2013  . Nausea alone 07/14/2013  . LEFT SIDED ULCERATIVE COLITIS 08/15/2008    Past Surgical History:  Procedure Laterality Date  . ABDOMINAL HERNIA REPAIR     age 57  . COLONOSCOPY  05/12/2012   multiple   . TONSILLECTOMY      Family History        Family Status  Relation Name Status  . Father  Deceased  . Mother  (Not Specified)  . Neg Hx  (Not Specified)        Her family history includes Breast cancer  in her mother; Glaucoma in her mother; Hyperlipidemia in her mother; Lung cancer (age of onset: 65) in her father; Macular degeneration in her father. There is no history of Colon cancer.      Allergies  Allergen Reactions  . Doxycycline Rash     Current Outpatient Medications:  Marland Kitchen  Golimumab 100 MG/ML SOAJ, Inject 100 mg into the skin every 28 (twenty-eight) days., Disp: 1 Syringe, Rfl: 11 .  LIALDA 1.2 g EC tablet, TAKE 2 TABLETS BY MOUTH DAILY WITH BREAKFAST, Disp: 60 tablet, Rfl: 2 .  Multiple Vitamin (MULTIVITAMIN) tablet, Take 1 tablet by mouth daily., Disp: , Rfl:  .  norgestrel-ethinyl estradiol (LO/OVRAL,CRYSELLE) 0.3-30 MG-MCG tablet, Take 1 tablet by mouth daily., Disp: , Rfl:  .  sertraline (ZOLOFT) 50 MG tablet, Take 50 mg by mouth daily., Disp: , Rfl:    Patient Care Team: Alycia Rossetti, MD as PCP - General (Family Medicine)      Objective:   Vitals: BP 110/64   Pulse 62   Temp 98.1 F (36.7 C) (Oral)   Resp 12   Ht 5' 4"  (1.626 m)   Wt 158 lb (71.7 kg)   LMP 08/09/2017 Comment: regular  SpO2 97%   BMI 27.12 kg/m    Vitals:   09/02/17 0842  BP: 110/64  Pulse: 62  Resp: 12  Temp: 98.1 F (36.7 C)  TempSrc: Oral  SpO2: 97%  Weight: 158 lb (71.7 kg)  Height: 5' 4"  (1.626 m)     Physical Exam  Constitutional: She is oriented to person, place, and time. She appears well-developed and well-nourished.  Non-toxic appearance. No distress.  HENT:  Head: Normocephalic and atraumatic.  Right Ear: Tympanic membrane, external ear and ear canal normal.  Left Ear: Tympanic membrane, external ear and ear canal normal.  Nose: Nose normal.  Mouth/Throat: Uvula is midline, oropharynx is clear and moist and mucous membranes are normal. Mucous membranes are not pale, not dry and not cyanotic. No oropharyngeal exudate, posterior oropharyngeal edema, posterior oropharyngeal erythema or tonsillar abscesses. Tonsils are 0 on the right. Tonsils are 0 on the left.  Eyes:  Pupils are equal, round, and reactive to light. Conjunctivae, EOM and lids are normal. No scleral icterus.  Neck: Normal range of motion and phonation normal. Neck supple. No tracheal deviation present.  Cardiovascular: Normal rate, regular rhythm, normal heart sounds and normal pulses. Exam reveals no gallop and no friction rub.  No murmur heard. Pulses:      Radial pulses are 2+ on the right side, and 2+ on the left side.  Posterior tibial pulses are 2+ on the right side, and 2+ on the left side.  No LE edema  Pulmonary/Chest: Effort normal and breath sounds normal. No accessory muscle usage or stridor. No respiratory distress. She has no decreased breath sounds. She has no wheezes. She has no rhonchi. She has no rales. She exhibits no tenderness.  Abdominal: Soft. Normal appearance and bowel sounds are normal. She exhibits no distension. There is no tenderness. There is no rigidity, no rebound, no guarding and no CVA tenderness.  Musculoskeletal: Normal range of motion. She exhibits no edema or deformity.  Lymphadenopathy:    She has no cervical adenopathy.  Neurological: She is alert and oriented to person, place, and time. She exhibits normal muscle tone. Gait normal.  Skin: Skin is warm, dry and intact. Capillary refill takes less than 2 seconds. No rash noted. She is not diaphoretic. No pallor.  Psychiatric: She has a normal mood and affect. Her speech is normal and behavior is normal.  Nursing note and vitals reviewed.    Depression Screen PHQ 2/9 Scores 09/02/2017 05/13/2017  PHQ - 2 Score 0 0  PHQ- 9 Score 1 -      Assessment & Plan:     Routine Health Maintenance and Physical Exam  Exercise Activities and Dietary recommendations Goals    . DIET - REDUCE FAT INTAKE       Immunization History  Administered Date(s) Administered  . Hepatitis B 07/29/2005, 09/03/2005, 01/28/2006  . Influenza-Unspecified 12/14/2001, 12/14/2004, 01/14/2006, 12/15/2006  . PPD Test  05/04/2012    Health Maintenance  Topic Date Due  . HIV Screening  09/03/2018 (Originally 06/15/1982)  . COLONOSCOPY  10/17/2018 (Originally 08/27/2017)  . INFLUENZA VACCINE  10/14/2017  . MAMMOGRAM  01/15/2019  . PAP SMEAR  02/16/2020  . TETANUS/TDAP  03/17/2023   Colonoscopy - in August for UC, has GI established Dr. Hilarie Fredrickson Mammogram and PAP with OBGYN, UTD (usually done in Dec.)   Discussed health benefits of physical activity, and encouraged her to engage in regular exercise appropriate for her age and condition.    --------------------------------------------------------------------  1. General medical exam - Lipid panel - CBC with Differential/Platelet - COMPLETE METABOLIC PANEL WITH GFR - Urinalysis, Routine w reflex microscopic  2. Screening examination for pulmonary tuberculosis - QuantiFERON-TB Gold Plus  3. Left sided colitis without complications ([place]) Managed by GI  4. Overweight (BMI 25.0-29.9) Counseling done Diet and exercise, encouraged to increase amount and intensity of aerobic exercise and continue weight lifting for bone health Will use employer health insurance resources for achieving some of her wellness goals including weight loss and managing cholesterol which she anticipates will be elevated.  5. Immunocompromised ([place])   Delsa Grana, PA-C 09/02/17 1:20 PM  [facility]    2:47 PM Will add shingrix vaccine order to be sent to pharmacy and notify pt she is due (52 y/o) and non-live, recombinant vaccine is safe to administer to immunocompromised.   Pt advised to check with pharmacy re: insurance coverage/administration and advise if not able to afford or obtain.   Pt also needs to request records for Tdap (per pt report done in 2015). Need to obtain PAP report (last done 02/2017).

## 2017-09-02 NOTE — Addendum Note (Signed)
Addended by: Delsa Grana on: 09/02/2017 02:52 PM   Modules accepted: Orders

## 2017-09-04 LAB — QUANTIFERON-TB GOLD PLUS
Mitogen-NIL: 7.73 IU/mL
NIL: 0.02 IU/mL
QUANTIFERON-TB GOLD PLUS: NEGATIVE
TB1-NIL: 0 [IU]/mL
TB2-NIL: 0.01 IU/mL

## 2017-09-07 MED FILL — SERTRALINE HCL 50 MG TABLET: 50 | 90 days supply | Qty: 90 | Fill #2

## 2017-09-07 NOTE — Progress Notes (Signed)
Labs resulted:  CMP excellent, fasting glucose normal (85), CBC normal Lipid panel - total cholesterol increased from past labs - total 226, HDL is good @ 53, however LDL 140.  Using the AHA/ACC guideline and risk calculator - you only have 1.1% 10-year risk of heart disease or stroke and moderate to high dose statins are not indicated to reduce the 10 year risk.  However old guidelines would have wanted your LDL under 100.  This can be done with diet and exercise, but we can also start fenofibrate to help get better cholesterol control.   I would recheck lipids in 6 months with either approach (diet and exercise or diet, exercise and fenofibrate).

## 2017-09-10 ENCOUNTER — Other Ambulatory Visit: Payer: Self-pay | Admitting: Internal Medicine

## 2017-09-10 MED FILL — LIALDA 1.2 GM TABLET SA: 1.2 | 30 days supply | Qty: 60 | Fill #2

## 2017-09-13 ENCOUNTER — Other Ambulatory Visit: Payer: Self-pay | Admitting: Internal Medicine

## 2017-09-13 MED ORDER — GOLIMUMAB 100 MG/ML ~~LOC~~ SOAJ: SUBCUTANEOUS | 11 refills | Status: DC

## 2017-09-13 MED FILL — SIMPONI 100 MG/ML SOAJ: 100 | 28 days supply | Qty: 1 | Fill #0

## 2017-09-24 ENCOUNTER — Encounter: Payer: Self-pay | Admitting: Internal Medicine

## 2017-10-11 ENCOUNTER — Other Ambulatory Visit: Payer: Self-pay | Admitting: Internal Medicine

## 2017-10-11 MED FILL — SIMPONI 100 MG/ML SOAJ: 100 | 28 days supply | Qty: 1 | Fill #1

## 2017-10-11 MED FILL — LIALDA 1.2 GM TABLET SA: 1.2 | 30 days supply | Qty: 60 | Fill #0

## 2017-10-27 ENCOUNTER — Encounter: Payer: 59 | Admitting: Internal Medicine

## 2017-10-28 MED FILL — NORETHIN-ESTRAD-FERR 1-0.02: 1-20 | 84 days supply | Qty: 84 | Fill #3

## 2017-10-29 ENCOUNTER — Ambulatory Visit (AMBULATORY_SURGERY_CENTER): Payer: Self-pay

## 2017-10-29 VITALS — Ht 64.0 in | Wt 158.0 lb

## 2017-10-29 DIAGNOSIS — K5150 Left sided colitis without complications: Secondary | ICD-10-CM

## 2017-10-29 MED ORDER — NA SULFATE-K SULFATE-MG SULF 17.5-3.13-1.6 GM/177ML PO SOLN
1.0000 | Freq: Once | ORAL | 0 refills | Status: AC
Start: 2017-10-29 — End: 2017-10-29

## 2017-10-29 MED FILL — SUPREP BOWEL PREP KIT: 17.5-3.13-1 | 1 days supply | Qty: 354 | Fill #0

## 2017-10-29 NOTE — Progress Notes (Signed)
No egg or soy allergy known to patient  No issues with past sedation with any surgeries  or procedures, no intubation problems  No diet pills per patient No home 02 use per patient  No blood thinners per patient  Pt denies issues with constipation  No A fib or A flutter  EMMI video sent to pt's e mail. Pt denies

## 2017-11-04 ENCOUNTER — Encounter: Payer: Self-pay | Admitting: Internal Medicine

## 2017-11-05 ENCOUNTER — Telehealth: Payer: Self-pay | Admitting: Pharmacist

## 2017-11-05 NOTE — Telephone Encounter (Signed)
Called patient to schedule an appointment for the [facility]. I was unable to reach the patient so I left a HIPAA-compliant message requesting that the patient return my call.

## 2017-11-09 MED FILL — SIMPONI 100 MG/ML SOAJ: 100 | 28 days supply | Qty: 1 | Fill #2

## 2017-11-10 ENCOUNTER — Encounter: Payer: Self-pay | Admitting: Family Medicine

## 2017-11-12 NOTE — Telephone Encounter (Signed)
Called patient to schedule an appointment for the [facility]. I was unable to reach the patient so I left a HIPAA-compliant message requesting that the patient return my call. Second attempt.

## 2017-11-17 ENCOUNTER — Ambulatory Visit (INDEPENDENT_AMBULATORY_CARE_PROVIDER_SITE_OTHER): Payer: 59 | Admitting: Pharmacist

## 2017-11-17 DIAGNOSIS — Z79899 Other long term (current) drug therapy: Secondary | ICD-10-CM

## 2017-11-17 MED ORDER — GOLIMUMAB 100 MG/ML ~~LOC~~ SOAJ
SUBCUTANEOUS | 11 refills | Status: DC
Start: 2017-11-17 — End: 2018-12-12

## 2017-11-17 NOTE — Progress Notes (Signed)
   S: Patient presents to [facility] for review of their specialty medication therapy.  Patient is currently taking Simponi for ulcerative colitis. Patient is managed by Dr. Hilarie Fredrickson for this.   Adherence: denies any missed doses  Efficacy: feels like it is working very well for her. Getting a colonoscopy tomorrow. Denies any adverse effects.  Dosing:  Ulcerative colitis: 100 mg every 4 weeks   Screening: TB test: negative 08/2017 Hepatitis: negative 04/2015  Monitoring: S/sx of infection: denies CBC: see below S/sx of hypersensitivity: denies S/sx of malignancy: denies S/sx of heart failure: denies S/sx of autoimmune disorder: denies   O:     Lab Results  Component Value Date   WBC 6.8 09/02/2017   HGB 13.9 09/02/2017   HCT 40.9 09/02/2017   MCV 88.1 09/02/2017   PLT 308 09/02/2017      Chemistry      Component Value Date/Time   NA 138 09/02/2017 0925   K 4.4 09/02/2017 0925   CL 103 09/02/2017 0925   CO2 25 09/02/2017 0925   BUN 14 09/02/2017 0925   CREATININE 0.77 09/02/2017 0925      Component Value Date/Time   CALCIUM 9.6 09/02/2017 0925   ALKPHOS 58 02/03/2016 1410   AST 14 09/02/2017 0925   ALT 7 09/02/2017 0925   BILITOT 0.6 09/02/2017 0925       A/P: 1. Medication review: Patient currently on Simponi for ulcerative colitis. Reviewed the medication with the patient, including the following: Simponi, golimumab, is a TNF? blocker.  Patient educated on purpose, proper use and potential adverse effects of Simponi. There is an increased risk of infection and malignancy with this medication. Do not give patients live vaccinations while they are on this medication. No recommendations for changes.    Christella Hartigan, PharmD, BCPS, BCACP, CPP Clinical Pharmacist Practitioner  [phone]

## 2017-11-18 ENCOUNTER — Encounter: Payer: Self-pay | Admitting: Internal Medicine

## 2017-11-18 ENCOUNTER — Ambulatory Visit (AMBULATORY_SURGERY_CENTER): Payer: 59 | Admitting: Internal Medicine

## 2017-11-18 VITALS — BP 103/64 | HR 60 | Temp 98.9°F | Resp 18 | Ht 64.0 in | Wt 155.0 lb

## 2017-11-18 DIAGNOSIS — K599 Functional intestinal disorder, unspecified: Secondary | ICD-10-CM | POA: Diagnosis not present

## 2017-11-18 DIAGNOSIS — Z1211 Encounter for screening for malignant neoplasm of colon: Secondary | ICD-10-CM | POA: Diagnosis not present

## 2017-11-18 DIAGNOSIS — K5150 Left sided colitis without complications: Secondary | ICD-10-CM | POA: Diagnosis not present

## 2017-11-18 MED ORDER — SODIUM CHLORIDE 0.9 % IV SOLN: 500.0000 mL | Freq: Once | INTRAVENOUS | Status: DC

## 2017-11-18 MED ORDER — MESALAMINE 1.2 G PO TBEC: 1.2000 g | DELAYED_RELEASE_TABLET | Freq: Every day | ORAL | 3 refills | Status: DC

## 2017-11-18 MED FILL — LIALDA 1.2 GM TABLET SA: 1.2 | 60 days supply | Qty: 60 | Fill #0

## 2017-11-18 NOTE — Op Note (Signed)
[facility] Patient Name: [name] Procedure Date: 11/18/2017 3:45 PM MRN: [id number] Endoscopist: Jerene Bears , MD Age: 52 Referring MD:  Date of Birth: 05-Mar-1966 Gender: Female Account #: [id number] Procedure:                Colonoscopy Indications:              High risk colon cancer surveillance: Ulcerative                            left sided colitis of 15 (or more) years duration,                            Last colonoscopy: June 2017 Medicines:                Monitored Anesthesia Care Procedure:                Pre-Anesthesia Assessment:                           - Prior to the procedure, a History and Physical                            was performed, and patient medications and                            allergies were reviewed. The patient's tolerance of                            previous anesthesia was also reviewed. The risks                            and benefits of the procedure and the sedation                            options and risks were discussed with the patient.                            All questions were answered, and informed consent                            was obtained. Prior Anticoagulants: The patient has                            taken no previous anticoagulant or antiplatelet                            agents. ASA Grade Assessment: II - A patient with                            mild systemic disease. After reviewing the risks                            and benefits, the patient was deemed in  satisfactory condition to undergo the procedure.                           After obtaining informed consent, the colonoscope                            was passed under direct vision. Throughout the                            procedure, the patient's blood pressure, pulse, and                            oxygen saturations were monitored continuously. The                            Model PCF-H190DL [id number]) scope  was introduced                            through the anus and advanced to the terminal                            ileum. The colonoscopy was performed without                            difficulty. The patient tolerated the procedure                            well. The quality of the bowel preparation was                            good. The terminal ileum, ileocecal valve,                            appendiceal orifice, and rectum were photographed. Scope In: 4:01:19 PM Scope Out: 9:47:65 PM Scope Withdrawal Time: 0 hours 12 minutes 30 seconds  Total Procedure Duration: 0 hours 15 minutes 27 seconds  Findings:                 The digital rectal exam was normal.                           The terminal ileum appeared normal.                           Inflammation was not found based on the endoscopic                            appearance of the mucosa in the colon. This was                            graded as Mayo Score 0 (normal or inactive                            disease), and when compared to the previous  examination, the findings are improved. Four                            biopsies were taken every 10 cm with a cold forceps                            from the entire colon for ulcerative colitis                            surveillance. These biopsy specimens from the right                            colon and left colon were sent to Pathology.                           Internal hemorrhoids were found during                            retroflexion. The hemorrhoids were small. Complications:            No immediate complications. Estimated Blood Loss:     Estimated blood loss was minimal. Impression:               - The examined portion of the ileum was normal.                           - Inactive (Mayo Score 0) ulcerative colitis, in                            remission, improved since the last examination.                            Biopsied.                            - Small internal hemorrhoids. Recommendation:           - Patient has a contact number available for                            emergencies. The signs and symptoms of potential                            delayed complications were discussed with the                            patient. Return to normal activities tomorrow.                            Written discharge instructions were provided to the                            patient.                           - Resume previous diet.                           -  Continue present medications including Simponi.                           - Await pathology results.                           - Repeat colonoscopy is recommended for                            surveillance. The colonoscopy date will be                            determined after pathology results from today's                            exam become available for review.                           - Referral to [facility] for                            evaluation of polyarthritis symptoms. Jerene Bears, MD 11/18/2017 4:25:05 PM This report has been signed electronically.

## 2017-11-18 NOTE — Progress Notes (Signed)
Called to room to assist during endoscopic procedure.  Patient ID and intended procedure confirmed with present staff. Received instructions for my participation in the procedure from the performing physician.  

## 2017-11-18 NOTE — Progress Notes (Signed)
Pt's states no medical or surgical changes since previsit or office visit. 

## 2017-11-18 NOTE — Patient Instructions (Signed)
YOU HAD AN ENDOSCOPIC PROCEDURE TODAY AT [facility]:   Refer to the procedure report that was given to you for any specific questions about what was found during the examination.  If the procedure report does not answer your questions, please call your gastroenterologist to clarify.  If you requested that your care partner not be given the details of your procedure findings, then the procedure report has been included in a sealed envelope for you to review at your convenience later.  YOU SHOULD EXPECT: Some feelings of bloating in the abdomen. Passage of more gas than usual.  Walking can help get rid of the air that was put into your GI tract during the procedure and reduce the bloating. If you had a lower endoscopy (such as a colonoscopy or flexible sigmoidoscopy) you may notice spotting of blood in your stool or on the toilet paper. If you underwent a bowel prep for your procedure, you may not have a normal bowel movement for a few days.  Please Note:  You might notice some irritation and congestion in your nose or some drainage.  This is from the oxygen used during your procedure.  There is no need for concern and it should clear up in a day or so.  SYMPTOMS TO REPORT IMMEDIATELY:   Following lower endoscopy (colonoscopy or flexible sigmoidoscopy):  Excessive amounts of blood in the stool  Significant tenderness or worsening of abdominal pains  Swelling of the abdomen that is new, acute  Fever of 100F or higher  For urgent or emergent issues, a gastroenterologist can be reached at any hour by calling [phone].   DIET:  We do recommend a small meal at first, but then you may proceed to your regular diet.  Drink plenty of fluids but you should avoid alcoholic beverages for 24 hours.  ACTIVITY:  You should plan to take it easy for the rest of today and you should NOT DRIVE or use heavy machinery until tomorrow (because of the sedation medicines used during the test).     FOLLOW UP: Our staff will call the number listed on your records the next business day following your procedure to check on you and address any questions or concerns that you may have regarding the information given to you following your procedure. If we do not reach you, we will leave a message.  However, if you are feeling well and you are not experiencing any problems, there is no need to return our call.  We will assume that you have returned to your regular daily activities without incident.  If any biopsies were taken you will be contacted by phone or by letter within the next 1-3 weeks.  Please call us at [phone] if you have not heard about the biopsies in 3 weeks.    SIGNATURES/CONFIDENTIALITY: You and/or your care partner have signed paperwork which will be entered into your electronic medical record.  These signatures attest to the fact that that the information above on your After Visit Summary has been reviewed and is understood.  Full responsibility of the confidentiality of this discharge information lies with you and/or your care-partner.

## 2017-11-18 NOTE — Progress Notes (Signed)
Alert and oriented x 3, pleased with MAC, report to RN

## 2017-11-19 ENCOUNTER — Telehealth: Payer: Self-pay | Admitting: *Deleted

## 2017-11-19 NOTE — Telephone Encounter (Signed)
  Follow up Call-  Call back number 11/18/2017 08/28/2015  Post procedure Call Back phone  # [phone]  Permission to leave phone message Yes Yes  Some recent data might be hidden     Patient questions:  Do you have a fever, pain , or abdominal swelling? No. Pain Score  0 *  Have you tolerated food without any problems? Yes.    Have you been able to return to your normal activities? Yes.    Do you have any questions about your discharge instructions: Diet   No. Medications  No. Follow up visit  No.  Do you have questions or concerns about your Care? No.  Actions: * If pain score is 4 or above: No action needed, pain <4.

## 2017-11-25 ENCOUNTER — Encounter: Payer: Self-pay | Admitting: Internal Medicine

## 2017-11-26 ENCOUNTER — Telehealth: Payer: Self-pay | Admitting: Internal Medicine

## 2017-11-26 NOTE — Telephone Encounter (Signed)
Pt said that Dr. Hilarie Fredrickson wanted her to see a Rheumatologist but she is not sure if he was going to refer her to one. Pls call her.

## 2017-11-29 NOTE — Telephone Encounter (Signed)
Dr. Hilarie Fredrickson were you going to refer pt to a rhematologist? See note below and advise.  Referral faxed to Dr. Melissa Noon office at [phone]. Pt aware.

## 2017-11-29 NOTE — Telephone Encounter (Signed)
Referral faxed to Dr. Melissa Noon office, pt aware.

## 2017-11-29 NOTE — Telephone Encounter (Signed)
Yes, please refer for consult with Dr. Amil Amen or Trudie Reed for arthritis symptoms, hx of colitis.  For evaluation of inflammatory arthritis given her history of autoimmune disease/colitis

## 2017-12-13 MED FILL — SIMPONI 100 MG/ML SOAJ: 100 | 28 days supply | Qty: 1 | Fill #3

## 2017-12-17 DIAGNOSIS — K51818 Other ulcerative colitis with other complication: Secondary | ICD-10-CM | POA: Diagnosis not present

## 2017-12-17 DIAGNOSIS — E663 Overweight: Secondary | ICD-10-CM | POA: Diagnosis not present

## 2017-12-17 DIAGNOSIS — R5383 Other fatigue: Secondary | ICD-10-CM | POA: Diagnosis not present

## 2017-12-17 DIAGNOSIS — L88 Pyoderma gangrenosum: Secondary | ICD-10-CM | POA: Diagnosis not present

## 2017-12-17 DIAGNOSIS — Z6827 Body mass index (BMI) 27.0-27.9, adult: Secondary | ICD-10-CM | POA: Diagnosis not present

## 2017-12-17 DIAGNOSIS — M2550 Pain in unspecified joint: Secondary | ICD-10-CM | POA: Diagnosis not present

## 2017-12-24 ENCOUNTER — Other Ambulatory Visit: Payer: Self-pay | Admitting: Obstetrics and Gynecology

## 2017-12-24 DIAGNOSIS — Z1231 Encounter for screening mammogram for malignant neoplasm of breast: Secondary | ICD-10-CM

## 2017-12-29 ENCOUNTER — Encounter: Payer: Self-pay | Admitting: *Deleted

## 2017-12-31 MED FILL — SERTRALINE HCL 50 MG TABLET: 50 | 90 days supply | Qty: 90 | Fill #3

## 2018-01-03 MED FILL — NORETHIN-ESTRAD-FERR 1-0.02: 1-20 | 84 days supply | Qty: 84 | Fill #4

## 2018-01-06 DIAGNOSIS — Z83511 Family history of glaucoma: Secondary | ICD-10-CM | POA: Diagnosis not present

## 2018-01-06 DIAGNOSIS — K51818 Other ulcerative colitis with other complication: Secondary | ICD-10-CM | POA: Diagnosis not present

## 2018-01-06 DIAGNOSIS — M2550 Pain in unspecified joint: Secondary | ICD-10-CM | POA: Diagnosis not present

## 2018-01-06 DIAGNOSIS — H5203 Hypermetropia, bilateral: Secondary | ICD-10-CM | POA: Diagnosis not present

## 2018-01-06 DIAGNOSIS — L88 Pyoderma gangrenosum: Secondary | ICD-10-CM | POA: Diagnosis not present

## 2018-01-06 DIAGNOSIS — H524 Presbyopia: Secondary | ICD-10-CM | POA: Diagnosis not present

## 2018-01-06 DIAGNOSIS — H00015 Hordeolum externum left lower eyelid: Secondary | ICD-10-CM | POA: Diagnosis not present

## 2018-01-06 DIAGNOSIS — E663 Overweight: Secondary | ICD-10-CM | POA: Diagnosis not present

## 2018-01-06 DIAGNOSIS — M150 Primary generalized (osteo)arthritis: Secondary | ICD-10-CM | POA: Diagnosis not present

## 2018-01-06 DIAGNOSIS — R5383 Other fatigue: Secondary | ICD-10-CM | POA: Diagnosis not present

## 2018-01-06 DIAGNOSIS — Z6827 Body mass index (BMI) 27.0-27.9, adult: Secondary | ICD-10-CM | POA: Diagnosis not present

## 2018-01-06 DIAGNOSIS — H52223 Regular astigmatism, bilateral: Secondary | ICD-10-CM | POA: Diagnosis not present

## 2018-01-06 MED FILL — NEO/POLY/DEXAMET EYE OINT: 3.5-10000-0 | 15 days supply | Qty: 4 | Fill #0

## 2018-01-10 MED FILL — DICLOFENAC SODIUM 1% GEL: 1 | 12 days supply | Qty: 100 | Fill #0

## 2018-01-12 MED FILL — LIALDA 1.2 GM TABLET SA: 1.2 | 60 days supply | Qty: 60 | Fill #1

## 2018-01-12 MED FILL — SIMPONI 100 MG/ML SOAJ: 100 | 28 days supply | Qty: 1 | Fill #4

## 2018-01-28 ENCOUNTER — Ambulatory Visit
Admission: RE | Admit: 2018-01-28 | Discharge: 2018-01-28 | Disposition: A | Discharge status: Home / Self Care | Payer: 59 | Source: Ambulatory Visit | Attending: Obstetrics and Gynecology | Admitting: Obstetrics and Gynecology

## 2018-01-28 DIAGNOSIS — Z1231 Encounter for screening mammogram for malignant neoplasm of breast: Secondary | ICD-10-CM | POA: Diagnosis not present

## 2018-02-14 MED FILL — SIMPONI 100 MG/ML SOAJ: 100 | 28 days supply | Qty: 1 | Fill #5

## 2018-02-23 DIAGNOSIS — Z01419 Encounter for gynecological examination (general) (routine) without abnormal findings: Secondary | ICD-10-CM | POA: Diagnosis not present

## 2018-02-23 DIAGNOSIS — R87610 Atypical squamous cells of undetermined significance on cytologic smear of cervix (ASC-US): Secondary | ICD-10-CM | POA: Diagnosis not present

## 2018-02-23 DIAGNOSIS — Z1151 Encounter for screening for human papillomavirus (HPV): Secondary | ICD-10-CM | POA: Diagnosis not present

## 2018-02-23 DIAGNOSIS — Z6827 Body mass index (BMI) 27.0-27.9, adult: Secondary | ICD-10-CM | POA: Diagnosis not present

## 2018-02-23 MED FILL — NORETHINDRONE 0.35 MG TAB: 0.35 | 28 days supply | Qty: 28 | Fill #0

## 2018-03-17 MED FILL — SERTRALINE HCL 50 MG TABLET: 50 | 90 days supply | Qty: 90 | Fill #0

## 2018-03-17 MED FILL — SIMPONI 100 MG/ML SOAJ: 100 | 28 days supply | Qty: 1 | Fill #6

## 2018-03-17 MED FILL — LIALDA 1.2 GM TABLET SA: 1.2 | 60 days supply | Qty: 60 | Fill #2

## 2018-04-11 ENCOUNTER — Telehealth: Payer: Self-pay | Admitting: Internal Medicine

## 2018-04-11 MED ORDER — MESALAMINE 1.2 G PO TBEC: DELAYED_RELEASE_TABLET | ORAL | 1 refills | Status: DC

## 2018-04-11 MED FILL — NORLYDA 0.35 MG TABS: 0.35 | 28 days supply | Qty: 28 | Fill #1

## 2018-04-11 MED FILL — SIMPONI 100 MG/ML SOAJ: 100 | 28 days supply | Qty: 1 | Fill #0

## 2018-04-11 NOTE — Telephone Encounter (Signed)
Patient states she thought Dr.Pyrtle wanted her to take medication lialda 2x a day instead of 4 like she used to. Patient now states pharmacy will not refill medication bc it is wrote for once daily. Patient wanting to know which is correct.

## 2018-04-11 NOTE — Telephone Encounter (Signed)
Advised patient rx should be for two tablets daily per last orders. Rx sent to pharmacy.

## 2018-04-15 MED FILL — LIALDA 1.2 GM TABLET SA: 1.2 | 30 days supply | Qty: 60 | Fill #0

## 2018-05-04 ENCOUNTER — Encounter: Payer: Self-pay | Admitting: Family Medicine

## 2018-05-04 ENCOUNTER — Other Ambulatory Visit: Payer: Self-pay

## 2018-05-04 ENCOUNTER — Ambulatory Visit (INDEPENDENT_AMBULATORY_CARE_PROVIDER_SITE_OTHER): Payer: 59 | Admitting: Family Medicine

## 2018-05-04 VITALS — BP 120/66 | HR 58 | Temp 97.9°F | Resp 12 | Ht 64.0 in | Wt 158.0 lb

## 2018-05-04 DIAGNOSIS — M25511 Pain in right shoulder: Secondary | ICD-10-CM | POA: Diagnosis not present

## 2018-05-04 DIAGNOSIS — G8929 Other chronic pain: Secondary | ICD-10-CM

## 2018-05-04 DIAGNOSIS — R202 Paresthesia of skin: Secondary | ICD-10-CM

## 2018-05-04 DIAGNOSIS — M75101 Unspecified rotator cuff tear or rupture of right shoulder, not specified as traumatic: Secondary | ICD-10-CM | POA: Diagnosis not present

## 2018-05-04 MED ORDER — METHYLPREDNISOLONE 4 MG PO TBPK: ORAL_TABLET | ORAL | 0 refills | Status: DC

## 2018-05-04 NOTE — Progress Notes (Signed)
   Subjective:    Patient ID: [name], female    DOB: Nov 08, 1965, 53 y.o.   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 Iced and did home exercises. Has a throbbing pain with numbness down to 5th digit for past 10 days. 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 Had mildly positive RA but not diagnosed with RA,  She has used some NSAIDS but sparingly due to her UC       Right hand dominant.  Review Of Systems:  GEN- denies fatigue, fever, weight loss,weakness, recent illness HEENT- denies eye drainage, change in vision, nasal discharge, CVS- denies chest pain, palpitations RESP- denies SOB, cough, wheeze ABD- denies N/V, change in stools, abd pain GU- denies dysuria, hematuria, dribbling, incontinence MSK- + joint pain, muscle aches, injury Neuro- denies headache, dizziness, syncope, seizure activity       Objective:    BP 120/66   Pulse (!) 58   Temp 97.9 F (36.6 C) (Oral)   Resp 12   Ht 5' 4"  (1.626 m)   Wt 158 lb (71.7 kg)   SpO2 97%   BMI 27.12 kg/m  GEN- NAD, alert and oriented x3 HEENT- PERRL, EOMI, non injected sclera, pink conjunctiva, MMM, oropharynx clear Neck- Supple, no thyromegaly, neg spurlings , FROM CVS- RRR, no murmur RESP-CTAB MSK- Decreased ROM RUE compared to left, TTP near [facility] region, +empty can right side, positive neers, biceps in tact Neuro-CNII-XII in tact, normal tone, normal grasp, decreased sensation right hand/finger tips, neg tinels/phalens Pulses- Radial, DP- 2+        Assessment & Plan:      Problem List Items Addressed This Visit    None    Visit Diagnoses    Chronic right shoulder pain    -  Primary   Concern for rotator cuff  syndrome, given Medrol dosepak, has some nerve impingement to help with inflammation, tylenol also for pain Referral to orthopedics for evaluation defer imaging to them    Relevant Medications   methylPREDNISolone (MEDROL DOSEPAK) 4 MG TBPK tablet   Other Relevant Orders   Ambulatory referral to Orthopedic Surgery   Rotator cuff syndrome, right       Relevant Orders   Ambulatory referral to Orthopedic Surgery   Right hand paresthesia          Note: This dictation was prepared with Dragon dictation along with smaller phrase technology. Any transcriptional errors that result from this process are unintentional.

## 2018-05-04 NOTE — Patient Instructions (Addendum)
Referral to orthopedics  Medrol dose pak  Ice your shoulder  F/U as needed

## 2018-05-12 MED FILL — SIMPONI 100 MG/ML SOAJ: 100 | 28 days supply | Qty: 1 | Fill #1 | Status: TO

## 2018-05-12 MED FILL — LIALDA 1.2 GM TABLET SA: 1.2 | 30 days supply | Qty: 60 | Fill #1 | Status: TO

## 2018-05-13 DIAGNOSIS — M25511 Pain in right shoulder: Secondary | ICD-10-CM | POA: Diagnosis not present

## 2018-05-13 DIAGNOSIS — M519 Unspecified thoracic, thoracolumbar and lumbosacral intervertebral disc disorder: Secondary | ICD-10-CM | POA: Diagnosis not present

## 2018-05-13 DIAGNOSIS — M7501 Adhesive capsulitis of right shoulder: Secondary | ICD-10-CM | POA: Diagnosis not present

## 2018-05-13 DIAGNOSIS — M7541 Impingement syndrome of right shoulder: Secondary | ICD-10-CM | POA: Diagnosis not present

## 2018-05-13 MED FILL — NORLYDA 0.35 MG TABS: 0.35 | 28 days supply | Qty: 28 | Fill #2 | Status: TO

## 2018-05-13 MED FILL — predniSONE 10 MG TABS: 10 | 4 days supply | Qty: 10 | Fill #0 | Status: TO

## 2018-05-13 MED FILL — METHOCARBAMOL 500 MG TABS: 500 | 10 days supply | Qty: 10 | Fill #0

## 2018-06-13 MED FILL — SERTRALINE HCL 50 MG TABLET: 50 | 90 days supply | Qty: 90 | Fill #0

## 2018-06-13 MED FILL — NORETHINDRONE 0.35 MG TAB: 0.35 | 84 days supply | Qty: 84 | Fill #0

## 2018-06-13 MED FILL — SIMPONI 100 MG/ML SOAJ: 100 | 28 days supply | Qty: 1 | Fill #0

## 2018-06-13 MED FILL — LIALDA 1.2 GM TABLET SA: 1.2 | 30 days supply | Qty: 60 | Fill #0

## 2018-07-08 MED FILL — SIMPONI 100 MG/ML SOAJ: 100 | 28 days supply | Qty: 1 | Fill #1 | Status: TO

## 2018-07-08 MED FILL — predniSONE 10 MG TABS: 10 | 4 days supply | Qty: 10 | Fill #0

## 2018-07-08 MED FILL — LIALDA 1.2 GM TABLET SA: 1.2 | 30 days supply | Qty: 60 | Fill #1 | Status: TO

## 2018-07-08 MED FILL — DICLOFENAC SODIUM 1 % GEL: 1 | 12 days supply | Qty: 100 | Fill #0

## 2018-08-09 MED FILL — LIALDA 1.2 GM TABLET SA: 1.2 | 30 days supply | Qty: 60 | Fill #0

## 2018-08-10 ENCOUNTER — Telehealth: Payer: Self-pay | Admitting: *Deleted

## 2018-08-10 NOTE — Telephone Encounter (Signed)
Sent PA request through [email or web address] for simponi.   Patient has left sided ulcerative colitis.  She has antibiodies to Humira and has tried and failed alone in the past: prednisone, uceris, lialda, proctofoam, mercaptopurine, rowasa.  In addition, patient has dermatologic condition, pyoderma gangrenosum which is typically controlled with medications such as simponi. We will await insurance response.

## 2018-08-16 MED FILL — SIMPONI 100 MG/ML SOAJ: 100 | 28 days supply | Qty: 1 | Fill #0

## 2018-08-16 NOTE — Telephone Encounter (Signed)
[employer] has been approved from 08/12/18-08/11/19 for a maximum of 12 refills. PA reference [id number]. Request is approved for up to 39m (one pen) per 28 days. Medication must be filled through a [phone]

## 2018-09-05 MED FILL — NORLYDA 0.35 MG TABS: 0.35 | 84 days supply | Qty: 84 | Fill #0

## 2018-09-05 MED FILL — LIALDA 1.2 GM TABLET SA: 1.2 | 30 days supply | Qty: 60 | Fill #1

## 2018-09-05 MED FILL — SERTRALINE HCL 50 MG TABLET: 50 | 90 days supply | Qty: 90 | Fill #0

## 2018-09-14 DIAGNOSIS — M542 Cervicalgia: Secondary | ICD-10-CM | POA: Diagnosis not present

## 2018-09-14 DIAGNOSIS — M25511 Pain in right shoulder: Secondary | ICD-10-CM | POA: Diagnosis not present

## 2018-09-14 MED FILL — SIMPONI 100 MG/ML SOAJ: 100 | 28 days supply | Qty: 1 | Fill #1

## 2018-09-14 MED FILL — predniSONE 5 MG TABS: 5 | 6 days supply | Qty: 21 | Fill #0

## 2018-09-14 MED FILL — GABAPENTIN 300 MG CAPSULE: 300 | 30 days supply | Qty: 90 | Fill #0

## 2018-09-21 DIAGNOSIS — M25511 Pain in right shoulder: Secondary | ICD-10-CM | POA: Diagnosis not present

## 2018-09-28 DIAGNOSIS — M7501 Adhesive capsulitis of right shoulder: Secondary | ICD-10-CM | POA: Diagnosis not present

## 2018-09-28 DIAGNOSIS — M7541 Impingement syndrome of right shoulder: Secondary | ICD-10-CM | POA: Diagnosis not present

## 2018-10-13 ENCOUNTER — Other Ambulatory Visit: Payer: Self-pay | Admitting: Internal Medicine

## 2018-10-13 DIAGNOSIS — M7501 Adhesive capsulitis of right shoulder: Secondary | ICD-10-CM | POA: Diagnosis not present

## 2018-10-13 MED FILL — SIMPONI 100 MG/ML SOAJ: 100 | 28 days supply | Qty: 1 | Fill #2

## 2018-10-14 ENCOUNTER — Other Ambulatory Visit: Payer: Self-pay | Admitting: Internal Medicine

## 2018-10-18 DIAGNOSIS — M7501 Adhesive capsulitis of right shoulder: Secondary | ICD-10-CM | POA: Diagnosis not present

## 2018-10-21 DIAGNOSIS — M7501 Adhesive capsulitis of right shoulder: Secondary | ICD-10-CM | POA: Diagnosis not present

## 2018-10-25 DIAGNOSIS — M7501 Adhesive capsulitis of right shoulder: Secondary | ICD-10-CM | POA: Diagnosis not present

## 2018-10-28 DIAGNOSIS — M7501 Adhesive capsulitis of right shoulder: Secondary | ICD-10-CM | POA: Diagnosis not present

## 2018-11-01 DIAGNOSIS — M7501 Adhesive capsulitis of right shoulder: Secondary | ICD-10-CM | POA: Diagnosis not present

## 2018-11-04 DIAGNOSIS — M7501 Adhesive capsulitis of right shoulder: Secondary | ICD-10-CM | POA: Diagnosis not present

## 2018-11-08 DIAGNOSIS — M7501 Adhesive capsulitis of right shoulder: Secondary | ICD-10-CM | POA: Diagnosis not present

## 2018-11-11 DIAGNOSIS — M7501 Adhesive capsulitis of right shoulder: Secondary | ICD-10-CM | POA: Diagnosis not present

## 2018-11-11 MED FILL — SIMPONI 100 MG/ML SOAJ: 100 | 28 days supply | Qty: 1 | Fill #3

## 2018-12-06 ENCOUNTER — Other Ambulatory Visit: Payer: Self-pay

## 2018-12-06 ENCOUNTER — Encounter: Payer: Self-pay | Admitting: Internal Medicine

## 2018-12-06 ENCOUNTER — Ambulatory Visit: Payer: 59 | Admitting: Internal Medicine

## 2018-12-06 VITALS — BP 110/72 | HR 67 | Temp 97.8°F | Ht 64.0 in | Wt 158.1 lb

## 2018-12-06 DIAGNOSIS — Z79899 Other long term (current) drug therapy: Secondary | ICD-10-CM | POA: Diagnosis not present

## 2018-12-06 DIAGNOSIS — M130 Polyarthritis, unspecified: Secondary | ICD-10-CM | POA: Diagnosis not present

## 2018-12-06 DIAGNOSIS — Z23 Encounter for immunization: Secondary | ICD-10-CM

## 2018-12-06 DIAGNOSIS — K5150 Left sided colitis without complications: Secondary | ICD-10-CM

## 2018-12-06 DIAGNOSIS — L88 Pyoderma gangrenosum: Secondary | ICD-10-CM | POA: Diagnosis not present

## 2018-12-06 NOTE — Progress Notes (Signed)
Subjective:    Patient ID: [name], female    DOB: 09-08-1965, 53 y.o.   MRN: [id number]  HPI [name] is a 53 year old female with a history of chronic longstanding left-sided ulcerative colitis, history of pyoderma gangrenosum, history of polyarthritis who is here for follow-up.  She was last seen at the time of her surveillance colonoscopy performed on 11/18/2017.  She is here alone today.  Colonoscopy performed on 11/18/2017 revealed that her colitis is in complete endoscopic and histologic remission.  No evidence of dysplasia by biopsy.  She has been maintained on Simponi every 4 weeks and Lialda 2.4 g daily.  She reports that she is feeling well from a GI perspective.  No abdominal pain.  No diarrhea or constipation.  No blood in her stool or melena.  She feels that her colitis is under excellent control.  She is not had any further rash or pyoderma symptom.  She is having issue with joint pain.  She was seen by [facility] rheumatology in October 2019.  She had a slightly positive CCP antibody but there was possibility that this was a false positive.  She still having joint pains particularly hands wrists ankles and feet.  No knee pain or hip pain.  Can be moderate to severe at times.  Seems to be worse about a week before her next Simponi injection.  Has used very limited amounts of ibuprofen which seems to help.  She is concerned about losing benefit for Simponi but has approval through May 2021   Review of Systems As per HPI, otherwise negative  Current Medications, Allergies, Past Medical History, Past Surgical History, Family History and Social History were reviewed in [employer] record.     Objective:   Physical Exam BP 110/72   Pulse 67   Temp 97.8 F (36.6 C)   Ht 5' 4"  (1.626 m)   Wt 158 lb 2 oz (71.7 kg)   BMI 27.14 kg/m  Gen: awake, alert, NAD HEENT: anicteric, op clear CV: RRR, no mrg Pulm: CTA b/l Abd: soft, NT/ND, +BS throughout  Ext: no c/c/e Neuro: nonfocal  CBC    Component Value Date/Time   WBC 6.8 09/02/2017 0925   RBC 4.64 09/02/2017 0925   HGB 13.9 09/02/2017 0925   HCT 40.9 09/02/2017 0925   PLT 308 09/02/2017 0925   MCV 88.1 09/02/2017 0925   MCH 30.0 09/02/2017 0925   MCHC 34.0 09/02/2017 0925   RDW 12.2 09/02/2017 0925   LYMPHSABS 1,591 09/02/2017 0925   MONOABS 0.7 02/03/2016 1410   EOSABS 129 09/02/2017 0925   BASOSABS 41 09/02/2017 0925   CMP     Component Value Date/Time   NA 138 09/02/2017 0925   K 4.4 09/02/2017 0925   CL 103 09/02/2017 0925   CO2 25 09/02/2017 0925   GLUCOSE 85 09/02/2017 0925   BUN 14 09/02/2017 0925   CREATININE 0.77 09/02/2017 0925   CALCIUM 9.6 09/02/2017 0925   PROT 7.3 09/02/2017 0925   ALBUMIN 4.4 02/03/2016 1410   AST 14 09/02/2017 0925   ALT 7 09/02/2017 0925   ALKPHOS 58 02/03/2016 1410   BILITOT 0.6 09/02/2017 0925   GFRNONAA 90 09/02/2017 0925   GFRAA 104 09/02/2017 0925       Assessment & Plan:  53 year old female with a history of chronic longstanding left-sided ulcerative colitis, history of pyoderma gangrenosum, history of polyarthritis who is here for follow-up.   1.  Left-sided ulcerative colitis --in clinical  and endoscopic remission on Simponi and Lialda. --Increase Simponi to dosing every 21 days, see below --Continue Lialda 2.4 g daily --Surveillance colonoscopy due September 2021 --She will get flu vaccine at work soon --QuantiFERON gold today, CBC and CMP today --Pneumovax 13 today, also prescription for shingles vaccine  2.  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  Rheumatology did not find overt evidence of another autoimmune arthropathy.  They did give her a dose of Solu-Medrol which helped transiently.  She seems to have worsening pain about a week before dosing of her biologic thus I am going to increase dosing regimen to better control joint pains.  We also discussed adding immunomodulator therapy such as methotrexate or  azathioprine but will start first with more frequent Simponi dosing --Increase Simponi to every 3 weeks --Check TP MT today --Consideration of immunomodulator therapy if joint pains do not improve with increase Simponi dose  3.  Pyoderma gangrenosum --now resolved and being controlled with Simponi  Annual followup  25 minutes spent with the patient today. Greater than 50% was spent in counseling and coordination of care with the patient

## 2018-12-06 NOTE — Patient Instructions (Addendum)
Your provider has requested that you go to the basement level for lab work on Monday, 12/12/2018. Press "B" on the elevator. The lab is located at the first door on the left as you exit the elevator.  Please follow up with Dr Hilarie Fredrickson in 1 year.  Please contact [facility] to make an appointment for Shingrix vaccine. [phone].  We have given you a pneumonia (pneumovax) vaccine today. You may experience a small amount of swelling and redness at the injection site. This is normal. Should you experience these symptoms, please apply ice to the injection area for 10-15 minutes every 2-3 hours. However, should these symptoms or any other symptoms related to the injection concern you, please call our office at [phone].  We will probably be increasing your Symponi to every 3 week dosing.  Continue Lialda 2.4 grams daily.

## 2018-12-07 ENCOUNTER — Telehealth: Payer: Self-pay

## 2018-12-07 NOTE — Telephone Encounter (Signed)
Request to increase Simponi to every 21 days faxed to [facility] pharmacy for prior auth.

## 2018-12-07 NOTE — Telephone Encounter (Signed)
-----   Message from Jerene Bears, MD sent at 12/06/2018  1:36 PM EDT ----- Please increase Simponi to every 21 days (currently on every 28 days)

## 2018-12-09 ENCOUNTER — Telehealth: Payer: Self-pay | Admitting: Internal Medicine

## 2018-12-09 ENCOUNTER — Other Ambulatory Visit: Payer: Self-pay | Admitting: Internal Medicine

## 2018-12-09 MED ORDER — MESALAMINE 1.2 G PO TBEC: DELAYED_RELEASE_TABLET | ORAL | 1 refills | Status: DC

## 2018-12-09 MED FILL — SERTRALINE HCL 50 MG TABLET: 50 | 90 days supply | Qty: 90 | Fill #1

## 2018-12-09 MED FILL — NORLYDA 0.35 MG TABS: 0.35 | 84 days supply | Qty: 84 | Fill #1

## 2018-12-09 MED FILL — LIALDA 1.2 GM TABLET SA: 1.2 | 90 days supply | Qty: 180 | Fill #0

## 2018-12-09 NOTE — Telephone Encounter (Signed)
Pt needs a refill for her Hundred and Lialda

## 2018-12-09 NOTE — Telephone Encounter (Signed)
Lialda refilled. Vaughan Basta, can you help with simponi?

## 2018-12-12 ENCOUNTER — Telehealth: Payer: Self-pay | Admitting: Internal Medicine

## 2018-12-12 ENCOUNTER — Other Ambulatory Visit: Payer: Self-pay | Admitting: Pharmacist

## 2018-12-12 ENCOUNTER — Other Ambulatory Visit (INDEPENDENT_AMBULATORY_CARE_PROVIDER_SITE_OTHER): Payer: 59

## 2018-12-12 ENCOUNTER — Other Ambulatory Visit: Payer: Self-pay

## 2018-12-12 DIAGNOSIS — K5150 Left sided colitis without complications: Secondary | ICD-10-CM

## 2018-12-12 LAB — CBC WITH DIFFERENTIAL/PLATELET
Basophils Absolute: 0.1 10*3/uL (ref 0.0–0.1)
Basophils Relative: 1.1 % (ref 0.0–3.0)
Eosinophils Absolute: 0.2 10*3/uL (ref 0.0–0.7)
Eosinophils Relative: 2.8 % (ref 0.0–5.0)
HCT: 43.8 % (ref 36.0–46.0)
Hemoglobin: 14.8 g/dL (ref 12.0–15.0)
Lymphocytes Relative: 21.8 % (ref 12.0–46.0)
Lymphs Abs: 1.4 10*3/uL (ref 0.7–4.0)
MCHC: 33.9 g/dL (ref 30.0–36.0)
MCV: 90.1 fl (ref 78.0–100.0)
Monocytes Absolute: 0.7 10*3/uL (ref 0.1–1.0)
Monocytes Relative: 11.4 % (ref 3.0–12.0)
Neutro Abs: 4 10*3/uL (ref 1.4–7.7)
Neutrophils Relative %: 62.9 % (ref 43.0–77.0)
Platelets: 261 10*3/uL (ref 150.0–400.0)
RBC: 4.86 Mil/uL (ref 3.87–5.11)
RDW: 13.1 % (ref 11.5–15.5)
WBC: 6.3 10*3/uL (ref 4.0–10.5)

## 2018-12-12 LAB — COMPREHENSIVE METABOLIC PANEL
ALT: 11 U/L (ref 0–35)
AST: 14 U/L (ref 0–37)
Albumin: 4.7 g/dL (ref 3.5–5.2)
Alkaline Phosphatase: 75 U/L (ref 39–117)
BUN: 20 mg/dL (ref 6–23)
CO2: 25 mEq/L (ref 19–32)
Calcium: 9.7 mg/dL (ref 8.4–10.5)
Chloride: 104 mEq/L (ref 96–112)
Creatinine, Ser: 0.65 mg/dL (ref 0.40–1.20)
GFR: 95.91 mL/min (ref >60.00)
Glucose, Bld: 85 mg/dL (ref 70–99)
Potassium: 4.4 mEq/L (ref 3.5–5.1)
Sodium: 138 mEq/L (ref 135–145)
Total Bilirubin: 0.6 mg/dL (ref 0.2–1.2)
Total Protein: 8.2 g/dL (ref 6.0–8.3)

## 2018-12-12 MED ORDER — SIMPONI 100 MG/ML ~~LOC~~ SOAJ: 100.0000 mg | SUBCUTANEOUS | 11 refills | Status: DC

## 2018-12-12 NOTE — Telephone Encounter (Signed)
Pharmacist called regarding prescription.  Call back [phone]

## 2018-12-12 NOTE — Telephone Encounter (Signed)
New order sent to pharmacy for Simponi every 21 days.

## 2018-12-13 NOTE — Telephone Encounter (Signed)
[name], pharmacy calling regarding Simponi. Would you mind giving them a call back?

## 2018-12-13 NOTE — Telephone Encounter (Signed)
Received call from hospital pharmacy and fax from [facility] that the request for simponi every 21 days has been denied. They request provider give information showing that the more frequent amount is clinically necessary for her care including a statement that the less frequent dose did not or would not work for pt and medical literature supporting the requested dose. Her plan only allows for 1 pen every 28 days. Contact number [phone]

## 2018-12-13 NOTE — Telephone Encounter (Signed)
Can you provide a number for me to contact our Simponi rep Thanks

## 2018-12-13 NOTE — Telephone Encounter (Signed)
Jani Files Executive Immunology Specialist - [facility] [place], [place]  Email: [email or web address] Cell: [phone]

## 2018-12-14 NOTE — Telephone Encounter (Signed)
Study faxed per Dr. Vena Rua request below.

## 2018-12-14 NOTE — Telephone Encounter (Signed)
I have spoken directly with the pharmacy and they will be resubmitting this request with the additional data I provided. Also I have been in contact with Alphonsa Overall and received medical literature supporting the dose escalation. This study was referenced but needs to be faxed to [employer] (her pharmacy benefit) We should have a new decision in 24-72 hrs.  I will email you the study and please fax it to [phone], reference the Member ID [id number] Reference for my call is: [id number]/[email or web address]

## 2018-12-16 ENCOUNTER — Telehealth: Payer: Self-pay | Admitting: Internal Medicine

## 2018-12-16 NOTE — Telephone Encounter (Signed)
Explained to pt that information has been sent in including literature the try and get the simponi q 21 days approved. Pt aware.

## 2018-12-19 LAB — GOLIMUMAB AND ANTI-GOL AB
Anti-Golimumab Antibody: <20 ng/mL
Golimumab: 0.6 ug/mL

## 2018-12-20 LAB — QUANTIFERON-TB GOLD PLUS

## 2018-12-20 LAB — THIOPURINE METHYLTRANSFERASE (TPMT), RBC: Thiopurine Methyltransferase, RBC: 7 nmol/hr/mL RBC — ABNORMAL LOW

## 2018-12-21 ENCOUNTER — Other Ambulatory Visit: Payer: Self-pay

## 2018-12-21 DIAGNOSIS — K5150 Left sided colitis without complications: Secondary | ICD-10-CM

## 2018-12-23 ENCOUNTER — Other Ambulatory Visit: Payer: 59

## 2018-12-23 DIAGNOSIS — K5150 Left sided colitis without complications: Secondary | ICD-10-CM | POA: Diagnosis not present

## 2018-12-25 LAB — QUANTIFERON-TB GOLD PLUS
Mitogen-NIL: >10 IU/mL
NIL: 0.02 IU/mL
QuantiFERON-TB Gold Plus: NEGATIVE
TB1-NIL: 0 IU/mL
TB2-NIL: 0 IU/mL

## 2018-12-26 ENCOUNTER — Other Ambulatory Visit: Payer: Self-pay | Admitting: Obstetrics and Gynecology

## 2018-12-26 DIAGNOSIS — Z1231 Encounter for screening mammogram for malignant neoplasm of breast: Secondary | ICD-10-CM

## 2019-01-12 ENCOUNTER — Ambulatory Visit (HOSPITAL_BASED_OUTPATIENT_CLINIC_OR_DEPARTMENT_OTHER): Payer: 59 | Admitting: Pharmacist

## 2019-01-12 ENCOUNTER — Other Ambulatory Visit: Payer: Self-pay

## 2019-01-12 DIAGNOSIS — Z79899 Other long term (current) drug therapy: Secondary | ICD-10-CM

## 2019-01-12 NOTE — Progress Notes (Signed)
   S: Patient presents for review of their specialty medication therapy.  Patient is currently taking Simponi for ulcerative colitis. Patient is managed by Dr. Hilarie Fredrickson for this.   Adherence: pt's dosing frequency was increased to q21days. We have been in the process of getting this approved by pharmacy. Pt reports missing her last several doses because of this. I have corresponded with Verita Lamb and we are waiting to hear back from an outside vendor concerning approval.  Efficacy: feels like it is working very well for her when she takes it. I reinforced that she has samples available at Dr. Vena Rua office. These will help hold her over until we hear from insurance.   Denies any adverse effects.  Dosing:  Ulcerative colitis: 100 mg every 3 weeks   Screening: TB test: negative 08/2017 Hepatitis: negative 04/2015  Monitoring: S/sx of infection: denies (when taking) CBC: see below S/sx of hypersensitivity: denies (when taking) S/sx of malignancy: denies (when taking) S/sx of heart failure: denies (when taking) S/sx of autoimmune disorder: denies (when taking)  O:  Lab Results  Component Value Date   WBC 6.3 12/12/2018   HGB 14.8 12/12/2018   HCT 43.8 12/12/2018   MCV 90.1 12/12/2018   PLT 261.0 12/12/2018      Chemistry      Component Value Date/Time   NA 138 12/12/2018 1235   K 4.4 12/12/2018 1235   CL 104 12/12/2018 1235   CO2 25 12/12/2018 1235   BUN 20 12/12/2018 1235   CREATININE 0.65 12/12/2018 1235   CREATININE 0.77 09/02/2017 0925      Component Value Date/Time   CALCIUM 9.7 12/12/2018 1235   ALKPHOS 75 12/12/2018 1235   AST 14 12/12/2018 1235   ALT 11 12/12/2018 1235   BILITOT 0.6 12/12/2018 1235      A/P: 1. Medication review: Patient currently on Simponi for ulcerative colitis. Reviewed the medication with the patient, including the following: Simponi, golimumab, is a TNF? blocker.  Patient educated on purpose, proper use and potential adverse  effects of Simponi. There is an increased risk of infection and malignancy with this medication. Do not give patients live vaccinations while they are on this medication. No recommendations for changes. Pt is aware that she will be injecting every 21 days once approved by insurance.   Benard Halsted, PharmD, [place] [phone]

## 2019-02-01 ENCOUNTER — Telehealth: Payer: Self-pay | Admitting: Internal Medicine

## 2019-02-01 ENCOUNTER — Other Ambulatory Visit: Payer: Self-pay

## 2019-02-01 MED ORDER — GOLIMUMAB 100 MG/ML ~~LOC~~ SOAJ: 100.0000 mg | SUBCUTANEOUS | 11 refills | Status: DC

## 2019-02-01 MED ORDER — SIMPONI 100 MG/ML ~~LOC~~ SOAJ: 100.0000 mg | SUBCUTANEOUS | 11 refills | Status: DC

## 2019-02-01 MED FILL — SIMPONI 100 MG/ML SOAJ: 100 | 28 days supply | Qty: 1 | Fill #0

## 2019-02-01 NOTE — Telephone Encounter (Signed)
Ok to renew for every 28 days I am sorry to hear that the insurance company would not work with me and her (I do not think I have another appeal option at this point)

## 2019-02-01 NOTE — Telephone Encounter (Signed)
Pt aware and script sent to pharmacy.

## 2019-02-01 NOTE — Telephone Encounter (Signed)
Pt states she received a letter from her insurance company stating that the 3rd and final appeal has been denied for her simponi every 21days. Pt wants a prescription sent in for the every 28 day dosing and she would like to think about what she wants to do regarding the joint pain. She is not sure she wants to take Mtx or Imuran. Please advise.

## 2019-02-01 NOTE — Addendum Note (Signed)
Addended by: Daisy Blossom, Annie Main L on: 02/01/2019 12:04 PM   Modules accepted: Orders

## 2019-02-01 NOTE — Progress Notes (Signed)
Addendum: Pt's insurance has denied Simponi at q21day dosing. Per the physician managing her UC, pt will be placed on q28day dosing.

## 2019-02-02 ENCOUNTER — Ambulatory Visit
Admission: RE | Admit: 2019-02-02 | Discharge: 2019-02-02 | Disposition: A | Discharge status: Home / Self Care | Payer: 59 | Source: Ambulatory Visit | Attending: Obstetrics and Gynecology | Admitting: Obstetrics and Gynecology

## 2019-02-02 ENCOUNTER — Other Ambulatory Visit: Payer: Self-pay

## 2019-02-02 DIAGNOSIS — Z1231 Encounter for screening mammogram for malignant neoplasm of breast: Secondary | ICD-10-CM

## 2019-02-03 MED FILL — SHINGRIX 50 MCG SUS: 50 | 1 days supply | Qty: 1 | Fill #0

## 2019-02-16 MED FILL — NORLYDA 0.35 MG TABS: 0.35 | 28 days supply | Qty: 28 | Fill #2

## 2019-02-24 DIAGNOSIS — H5203 Hypermetropia, bilateral: Secondary | ICD-10-CM | POA: Diagnosis not present

## 2019-02-27 DIAGNOSIS — Z01419 Encounter for gynecological examination (general) (routine) without abnormal findings: Secondary | ICD-10-CM | POA: Diagnosis not present

## 2019-02-27 DIAGNOSIS — Z1151 Encounter for screening for human papillomavirus (HPV): Secondary | ICD-10-CM | POA: Diagnosis not present

## 2019-02-27 DIAGNOSIS — Z6827 Body mass index (BMI) 27.0-27.9, adult: Secondary | ICD-10-CM | POA: Diagnosis not present

## 2019-02-27 MED FILL — SERTRALINE HCL 50 MG TABS: 50 | 30 days supply | Qty: 30 | Fill #0

## 2019-02-28 MED FILL — SIMPONI 100 MG/ML SOAJ: 100 | 28 days supply | Qty: 1 | Fill #1

## 2019-02-28 MED FILL — LIALDA 1.2 GM TABLET SA: 1.2 | 90 days supply | Qty: 180 | Fill #1

## 2019-03-06 LAB — HM PAP SMEAR: HM Pap smear: NEGATIVE

## 2019-03-22 ENCOUNTER — Other Ambulatory Visit: Payer: Self-pay | Admitting: Internal Medicine

## 2019-04-03 MED FILL — SIMPONI 100 MG/ML SOAJ: 100 | 28 days supply | Qty: 1 | Fill #0

## 2019-04-10 MED FILL — SERTRALINE HCL 50 MG TABLET: 50 | 30 days supply | Qty: 30 | Fill #0

## 2019-04-10 MED FILL — NORETHINDRONE 0.35 MG TAB: 0.35 | 28 days supply | Qty: 28 | Fill #0

## 2019-05-01 MED FILL — SIMPONI 100 MG/ML SOAJ: 100 | 28 days supply | Qty: 1 | Fill #1

## 2019-05-01 MED FILL — SHINGRIX 50 MCG SUS: 50 | 1 days supply | Qty: 1 | Fill #1

## 2019-05-09 MED FILL — NORETHINDRONE 0.35 MG TAB: 0.35 | 28 days supply | Qty: 28 | Fill #1

## 2019-05-09 MED FILL — SERTRALINE HCL 50 MG TABLET: 50 | 30 days supply | Qty: 30 | Fill #1

## 2019-05-30 MED FILL — SIMPONI 100 MG/ML SOAJ: 100 | 28 days supply | Qty: 1 | Fill #2

## 2019-05-30 MED FILL — LIALDA 1.2 GM TABLET SA: 1.2 | 90 days supply | Qty: 180 | Fill #0

## 2019-06-09 MED FILL — SERTRALINE HCL 50 MG TABLET: 50 | 30 days supply | Qty: 30 | Fill #2

## 2019-06-09 MED FILL — NORETHINDRONE 0.35 MG TAB: 0.35 | 84 days supply | Qty: 84 | Fill #2

## 2019-06-30 MED FILL — SIMPONI 100 MG/ML SOAJ: 100 | 28 days supply | Qty: 1 | Fill #3

## 2019-07-11 DIAGNOSIS — D225 Melanocytic nevi of trunk: Secondary | ICD-10-CM | POA: Diagnosis not present

## 2019-07-11 DIAGNOSIS — Z85828 Personal history of other malignant neoplasm of skin: Secondary | ICD-10-CM | POA: Diagnosis not present

## 2019-07-11 DIAGNOSIS — L728 Other follicular cysts of the skin and subcutaneous tissue: Secondary | ICD-10-CM | POA: Diagnosis not present

## 2019-07-11 DIAGNOSIS — L821 Other seborrheic keratosis: Secondary | ICD-10-CM | POA: Diagnosis not present

## 2019-07-11 DIAGNOSIS — L905 Scar conditions and fibrosis of skin: Secondary | ICD-10-CM | POA: Diagnosis not present

## 2019-07-11 DIAGNOSIS — L814 Other melanin hyperpigmentation: Secondary | ICD-10-CM | POA: Diagnosis not present

## 2019-07-11 DIAGNOSIS — L570 Actinic keratosis: Secondary | ICD-10-CM | POA: Diagnosis not present

## 2019-07-19 MED FILL — SERTRALINE HCL 50 MG TABS: 50 | 30 days supply | Qty: 30 | Fill #3

## 2019-07-31 MED FILL — SIMPONI 100 MG/ML SOAJ: 100 | 28 days supply | Qty: 1 | Fill #4

## 2019-08-04 ENCOUNTER — Other Ambulatory Visit: Payer: Self-pay

## 2019-08-04 ENCOUNTER — Ambulatory Visit (INDEPENDENT_AMBULATORY_CARE_PROVIDER_SITE_OTHER): Payer: 59 | Admitting: Family Medicine

## 2019-08-04 ENCOUNTER — Encounter: Payer: Self-pay | Admitting: Family Medicine

## 2019-08-04 VITALS — BP 118/64 | HR 98 | Temp 98.1°F | Resp 12 | Ht 64.0 in | Wt 161.0 lb

## 2019-08-04 DIAGNOSIS — E663 Overweight: Secondary | ICD-10-CM | POA: Diagnosis not present

## 2019-08-04 DIAGNOSIS — H9190 Unspecified hearing loss, unspecified ear: Secondary | ICD-10-CM | POA: Diagnosis not present

## 2019-08-04 DIAGNOSIS — E782 Mixed hyperlipidemia: Secondary | ICD-10-CM

## 2019-08-04 DIAGNOSIS — Z0001 Encounter for general adult medical examination with abnormal findings: Secondary | ICD-10-CM

## 2019-08-04 DIAGNOSIS — K5150 Left sided colitis without complications: Secondary | ICD-10-CM

## 2019-08-04 DIAGNOSIS — Z0000 Encounter for general adult medical examination without abnormal findings: Secondary | ICD-10-CM | POA: Diagnosis not present

## 2019-08-04 DIAGNOSIS — E559 Vitamin D deficiency, unspecified: Secondary | ICD-10-CM | POA: Diagnosis not present

## 2019-08-04 MED ORDER — NORETHINDRONE 0.35 MG PO TABS: 1.0000 | ORAL_TABLET | Freq: Every day | ORAL | 11 refills | Status: DC

## 2019-08-04 MED ORDER — PHENTERMINE HCL 37.5 MG PO TABS: 37.5000 mg | ORAL_TABLET | Freq: Every day | ORAL | 1 refills | Status: DC

## 2019-08-04 MED FILL — PHENTERMINE 37.5 MG TABLET: 37.5 | 30 days supply | Qty: 30 | Fill #0

## 2019-08-04 NOTE — Assessment & Plan Note (Signed)
Medications per GI  Check metabolic panel

## 2019-08-04 NOTE — Patient Instructions (Addendum)
F/U 2 MONTHS for medications If labs are normal, start phentermine at 1/2 tablet daily , if after 3-4 weeks you still have not noticed much change in appetite/weight  then increase to 1 full tablet  Water at least 80 ounces a day

## 2019-08-04 NOTE — Assessment & Plan Note (Signed)
Pt overeight with hyperlipidemia Would benefit from weight reduction to reduce risk of CAD/DM Check labs today  Plan to start phentermine, she has tolerated in the past without any difficulty, no known cardiovascular disease

## 2019-08-04 NOTE — Progress Notes (Signed)
   Subjective:    Patient ID: [name], female    DOB: Aug 01, 1965, 54 y.o.   MRN: [id number]  Patient presents for Annual Exam (is fasting)   Pt here for CPE   Medications and history reviewed    PAP Smear UTD 2019  Colonoscopy- 2019  Mammogram UTD Nov 2020  Immunizations- COVID-19/TDAP/FLU/shingrix  UTD   GYN- Dr.Cousins , currently on mini pill and zoloft for hot flashes    hyperlipidemia, last LDL 140 in June 2019   [facility] dermatology   She has had difficulty losing weight for the past 6 months or more. She did Whole 30 for 6 weeks, and only lost 3lbs She has been doing low carb, no sugary beverages, increased veggies and water Weight lifting a couple times a week and walking for cardio 3-4 times a week   Review Of Systems:  GEN- denies fatigue, fever, weight loss,weakness, recent illness HEENT- denies eye drainage, change in vision, nasal discharge, CVS- denies chest pain, palpitations RESP- denies SOB, cough, wheeze ABD- denies N/V, change in stools, abd pain GU- denies dysuria, hematuria, dribbling, incontinence MSK- denies joint pain, muscle aches, injury Neuro- denies headache, dizziness, syncope, seizure activity       Objective:    BP 118/64   Pulse 98   Temp 98.1 F (36.7 C) (Temporal)   Resp 12   Ht 5' 4"  (1.626 m)   Wt 161 lb (73 kg)   SpO2 (!) 66%   BMI 27.64 kg/m  GEN- NAD, alert and oriented x3 HEENT- PERRL, EOMI, non injected sclera, pink conjunctiva, MMM, oropharynx clear, TM clear bilat, no effusion  Neck- Supple, no thyromegaly CVS- RRR, no murmur RESP-CTAB ABD-NABS,soft,NT,ND EXT- No edema Pulses- Radial, DP- 2+  FALL/DEPRESSION/CAGE negative       Assessment & Plan:      Problem List Items Addressed This Visit      Unprioritized   LEFT SIDED ULCERATIVE COLITIS    Medications per GI  Check metabolic panel       Mixed hyperlipidemia   Relevant Orders   Lipid panel   Overweight (BMI 25.0-29.9)    Pt overeight with  hyperlipidemia Would benefit from weight reduction to reduce risk of CAD/DM Check labs today  Plan to start phentermine, she has tolerated in the past without any difficulty, no known cardiovascular disease        Other Visit Diagnoses    Routine general medical examination at a health care facility    -  Primary   CPE done, fasting labs obtained , prevention UTD    Relevant Orders   CBC with Differential/Platelet   Comprehensive metabolic panel   Lipid panel   TSH   Vitamin D deficiency       Relevant Orders   Vitamin D, 25-hydroxy   Perceived hearing changes       No cerumen impaction, passed hearing screen, offered full ENT evaluation but we will just monitor for now       Note: This dictation was prepared with Dragon dictation along with smaller phrase technology. Any transcriptional errors that result from this process are unintentional.

## 2019-08-05 LAB — CBC WITH DIFFERENTIAL/PLATELET
Absolute Monocytes: 485 cells/uL (ref 200–950)
Basophils Absolute: 39 cells/uL (ref 0–200)
Basophils Relative: 0.8 %
Eosinophils Absolute: 132 cells/uL (ref 15–500)
Eosinophils Relative: 2.7 %
HCT: 44.1 % (ref 35.0–45.0)
Hemoglobin: 14.6 g/dL (ref 11.7–15.5)
Lymphs Abs: 1387 cells/uL (ref 850–3900)
MCH: 29.9 pg (ref 27.0–33.0)
MCHC: 33.1 g/dL (ref 32.0–36.0)
MCV: 90.2 fL (ref 80.0–100.0)
MPV: 10.3 fL (ref 7.5–12.5)
Monocytes Relative: 9.9 %
Neutro Abs: 2857 cells/uL (ref 1500–7800)
Neutrophils Relative %: 58.3 %
Platelets: 279 10*3/uL (ref 140–400)
RBC: 4.89 10*6/uL (ref 3.80–5.10)
RDW: 12.2 % (ref 11.0–15.0)
Total Lymphocyte: 28.3 %
WBC: 4.9 10*3/uL (ref 3.8–10.8)

## 2019-08-05 LAB — COMPREHENSIVE METABOLIC PANEL
AG Ratio: 1.7 (calc) (ref 1.0–2.5)
ALT: 9 U/L (ref 6–29)
AST: 14 U/L (ref 10–35)
Albumin: 4.4 g/dL (ref 3.6–5.1)
Alkaline phosphatase (APISO): 80 U/L (ref 37–153)
BUN: 13 mg/dL (ref 7–25)
CO2: 26 mmol/L (ref 20–32)
Calcium: 9.2 mg/dL (ref 8.6–10.4)
Chloride: 103 mmol/L (ref 98–110)
Creat: 0.71 mg/dL (ref 0.50–1.05)
Globulin: 2.6 g/dL (calc) (ref 1.9–3.7)
Glucose, Bld: 98 mg/dL (ref 65–99)
Potassium: 4.3 mmol/L (ref 3.5–5.3)
Sodium: 138 mmol/L (ref 135–146)
Total Bilirubin: 0.6 mg/dL (ref 0.2–1.2)
Total Protein: 7 g/dL (ref 6.1–8.1)

## 2019-08-05 LAB — LIPID PANEL
Cholesterol: 186 mg/dL (ref <200)
HDL: 47 mg/dL — ABNORMAL LOW (ref >=50)
LDL Cholesterol (Calc): 123 mg/dL (calc) — ABNORMAL HIGH
Non-HDL Cholesterol (Calc): 139 mg/dL (calc) — ABNORMAL HIGH (ref <130)
Total CHOL/HDL Ratio: 4 (calc) (ref <5.0)
Triglycerides: 68 mg/dL (ref <150)

## 2019-08-05 LAB — TSH: TSH: 1.09 mIU/L

## 2019-08-05 LAB — VITAMIN D 25 HYDROXY (VIT D DEFICIENCY, FRACTURES): Vit D, 25-Hydroxy: 34 ng/mL (ref 30–100)

## 2019-08-09 ENCOUNTER — Encounter: Payer: Self-pay | Admitting: *Deleted

## 2019-08-15 MED FILL — SERTRALINE HCL 50 MG TABS: 50 | 30 days supply | Qty: 30 | Fill #4

## 2019-08-15 MED FILL — NORETHINDRONE 0.35 MG TAB: 0.35 | 84 days supply | Qty: 84 | Fill #3

## 2019-08-15 MED FILL — LIALDA 1.2 GM TABLET SA: 1.2 | 90 days supply | Qty: 180 | Fill #1

## 2019-09-01 ENCOUNTER — Telehealth: Payer: Self-pay | Admitting: Internal Medicine

## 2019-09-05 NOTE — Telephone Encounter (Signed)
Spoke with pt and pharmacy was supposed to fax a form stating pt still needs simponi for the insurance to cover the med to her. Called [facility] and they are going to refax to [phone].

## 2019-09-06 NOTE — Telephone Encounter (Signed)
Called an initiated prior auth, will take at least 72 hours before we hear back from decision via fax.

## 2019-09-12 MED FILL — SIMPONI 100 MG/ML SOAJ: 100 | 28 days supply | Qty: 1 | Fill #5

## 2019-09-21 MED FILL — SERTRALINE HCL 50 MG TABS: 50 | 30 days supply | Qty: 30 | Fill #5

## 2019-10-02 DIAGNOSIS — M9903 Segmental and somatic dysfunction of lumbar region: Secondary | ICD-10-CM | POA: Diagnosis not present

## 2019-10-02 DIAGNOSIS — M530 Cervicocranial syndrome: Secondary | ICD-10-CM | POA: Diagnosis not present

## 2019-10-02 DIAGNOSIS — M9901 Segmental and somatic dysfunction of cervical region: Secondary | ICD-10-CM | POA: Diagnosis not present

## 2019-10-02 DIAGNOSIS — M9902 Segmental and somatic dysfunction of thoracic region: Secondary | ICD-10-CM | POA: Diagnosis not present

## 2019-10-02 DIAGNOSIS — M47816 Spondylosis without myelopathy or radiculopathy, lumbar region: Secondary | ICD-10-CM | POA: Diagnosis not present

## 2019-10-02 DIAGNOSIS — M62830 Muscle spasm of back: Secondary | ICD-10-CM | POA: Diagnosis not present

## 2019-10-03 DIAGNOSIS — M530 Cervicocranial syndrome: Secondary | ICD-10-CM | POA: Diagnosis not present

## 2019-10-03 DIAGNOSIS — M9901 Segmental and somatic dysfunction of cervical region: Secondary | ICD-10-CM | POA: Diagnosis not present

## 2019-10-03 DIAGNOSIS — M47816 Spondylosis without myelopathy or radiculopathy, lumbar region: Secondary | ICD-10-CM | POA: Diagnosis not present

## 2019-10-03 DIAGNOSIS — M62830 Muscle spasm of back: Secondary | ICD-10-CM | POA: Diagnosis not present

## 2019-10-03 DIAGNOSIS — M9903 Segmental and somatic dysfunction of lumbar region: Secondary | ICD-10-CM | POA: Diagnosis not present

## 2019-10-03 DIAGNOSIS — M9902 Segmental and somatic dysfunction of thoracic region: Secondary | ICD-10-CM | POA: Diagnosis not present

## 2019-10-09 DIAGNOSIS — M9902 Segmental and somatic dysfunction of thoracic region: Secondary | ICD-10-CM | POA: Diagnosis not present

## 2019-10-09 DIAGNOSIS — M47816 Spondylosis without myelopathy or radiculopathy, lumbar region: Secondary | ICD-10-CM | POA: Diagnosis not present

## 2019-10-09 DIAGNOSIS — M530 Cervicocranial syndrome: Secondary | ICD-10-CM | POA: Diagnosis not present

## 2019-10-09 DIAGNOSIS — M62830 Muscle spasm of back: Secondary | ICD-10-CM | POA: Diagnosis not present

## 2019-10-09 DIAGNOSIS — M9903 Segmental and somatic dysfunction of lumbar region: Secondary | ICD-10-CM | POA: Diagnosis not present

## 2019-10-09 DIAGNOSIS — M9901 Segmental and somatic dysfunction of cervical region: Secondary | ICD-10-CM | POA: Diagnosis not present

## 2019-10-10 ENCOUNTER — Ambulatory Visit: Payer: 59 | Admitting: Family Medicine

## 2019-10-12 MED FILL — SIMPONI 100 MG/ML SOAJ: 100 | 28 days supply | Qty: 1 | Fill #6

## 2019-10-16 DIAGNOSIS — M9901 Segmental and somatic dysfunction of cervical region: Secondary | ICD-10-CM | POA: Diagnosis not present

## 2019-10-16 DIAGNOSIS — M9902 Segmental and somatic dysfunction of thoracic region: Secondary | ICD-10-CM | POA: Diagnosis not present

## 2019-10-16 DIAGNOSIS — M530 Cervicocranial syndrome: Secondary | ICD-10-CM | POA: Diagnosis not present

## 2019-10-16 DIAGNOSIS — M47816 Spondylosis without myelopathy or radiculopathy, lumbar region: Secondary | ICD-10-CM | POA: Diagnosis not present

## 2019-10-16 DIAGNOSIS — M62830 Muscle spasm of back: Secondary | ICD-10-CM | POA: Diagnosis not present

## 2019-10-16 DIAGNOSIS — M9903 Segmental and somatic dysfunction of lumbar region: Secondary | ICD-10-CM | POA: Diagnosis not present

## 2019-10-17 DIAGNOSIS — M47816 Spondylosis without myelopathy or radiculopathy, lumbar region: Secondary | ICD-10-CM | POA: Diagnosis not present

## 2019-10-17 DIAGNOSIS — M9903 Segmental and somatic dysfunction of lumbar region: Secondary | ICD-10-CM | POA: Diagnosis not present

## 2019-10-17 DIAGNOSIS — M62830 Muscle spasm of back: Secondary | ICD-10-CM | POA: Diagnosis not present

## 2019-10-17 DIAGNOSIS — M9902 Segmental and somatic dysfunction of thoracic region: Secondary | ICD-10-CM | POA: Diagnosis not present

## 2019-10-17 DIAGNOSIS — M530 Cervicocranial syndrome: Secondary | ICD-10-CM | POA: Diagnosis not present

## 2019-10-17 DIAGNOSIS — M9901 Segmental and somatic dysfunction of cervical region: Secondary | ICD-10-CM | POA: Diagnosis not present

## 2019-10-18 DIAGNOSIS — M62830 Muscle spasm of back: Secondary | ICD-10-CM | POA: Diagnosis not present

## 2019-10-18 DIAGNOSIS — M9902 Segmental and somatic dysfunction of thoracic region: Secondary | ICD-10-CM | POA: Diagnosis not present

## 2019-10-18 DIAGNOSIS — M47816 Spondylosis without myelopathy or radiculopathy, lumbar region: Secondary | ICD-10-CM | POA: Diagnosis not present

## 2019-10-18 DIAGNOSIS — M9903 Segmental and somatic dysfunction of lumbar region: Secondary | ICD-10-CM | POA: Diagnosis not present

## 2019-10-18 DIAGNOSIS — M9901 Segmental and somatic dysfunction of cervical region: Secondary | ICD-10-CM | POA: Diagnosis not present

## 2019-10-18 DIAGNOSIS — M530 Cervicocranial syndrome: Secondary | ICD-10-CM | POA: Diagnosis not present

## 2019-10-23 DIAGNOSIS — M9903 Segmental and somatic dysfunction of lumbar region: Secondary | ICD-10-CM | POA: Diagnosis not present

## 2019-10-23 DIAGNOSIS — M530 Cervicocranial syndrome: Secondary | ICD-10-CM | POA: Diagnosis not present

## 2019-10-23 DIAGNOSIS — M47816 Spondylosis without myelopathy or radiculopathy, lumbar region: Secondary | ICD-10-CM | POA: Diagnosis not present

## 2019-10-23 DIAGNOSIS — M62830 Muscle spasm of back: Secondary | ICD-10-CM | POA: Diagnosis not present

## 2019-10-23 DIAGNOSIS — M9901 Segmental and somatic dysfunction of cervical region: Secondary | ICD-10-CM | POA: Diagnosis not present

## 2019-10-23 DIAGNOSIS — M9902 Segmental and somatic dysfunction of thoracic region: Secondary | ICD-10-CM | POA: Diagnosis not present

## 2019-10-24 DIAGNOSIS — M9901 Segmental and somatic dysfunction of cervical region: Secondary | ICD-10-CM | POA: Diagnosis not present

## 2019-10-24 DIAGNOSIS — M530 Cervicocranial syndrome: Secondary | ICD-10-CM | POA: Diagnosis not present

## 2019-10-24 DIAGNOSIS — M62830 Muscle spasm of back: Secondary | ICD-10-CM | POA: Diagnosis not present

## 2019-10-24 DIAGNOSIS — M9903 Segmental and somatic dysfunction of lumbar region: Secondary | ICD-10-CM | POA: Diagnosis not present

## 2019-10-24 DIAGNOSIS — M47816 Spondylosis without myelopathy or radiculopathy, lumbar region: Secondary | ICD-10-CM | POA: Diagnosis not present

## 2019-10-24 DIAGNOSIS — M9902 Segmental and somatic dysfunction of thoracic region: Secondary | ICD-10-CM | POA: Diagnosis not present

## 2019-10-24 MED FILL — SERTRALINE HCL 50 MG TABLET: 50 | 30 days supply | Qty: 30 | Fill #6

## 2019-10-24 MED FILL — PHENTERMINE 37.5 MG TABLET: 37.5 | 30 days supply | Qty: 30 | Fill #1

## 2019-10-30 DIAGNOSIS — M9902 Segmental and somatic dysfunction of thoracic region: Secondary | ICD-10-CM | POA: Diagnosis not present

## 2019-10-30 DIAGNOSIS — M62830 Muscle spasm of back: Secondary | ICD-10-CM | POA: Diagnosis not present

## 2019-10-30 DIAGNOSIS — M47816 Spondylosis without myelopathy or radiculopathy, lumbar region: Secondary | ICD-10-CM | POA: Diagnosis not present

## 2019-10-30 DIAGNOSIS — M530 Cervicocranial syndrome: Secondary | ICD-10-CM | POA: Diagnosis not present

## 2019-10-30 DIAGNOSIS — M9903 Segmental and somatic dysfunction of lumbar region: Secondary | ICD-10-CM | POA: Diagnosis not present

## 2019-10-30 DIAGNOSIS — M9901 Segmental and somatic dysfunction of cervical region: Secondary | ICD-10-CM | POA: Diagnosis not present

## 2019-11-01 ENCOUNTER — Ambulatory Visit: Payer: 59 | Admitting: Family Medicine

## 2019-11-01 DIAGNOSIS — M9903 Segmental and somatic dysfunction of lumbar region: Secondary | ICD-10-CM | POA: Diagnosis not present

## 2019-11-01 DIAGNOSIS — M9902 Segmental and somatic dysfunction of thoracic region: Secondary | ICD-10-CM | POA: Diagnosis not present

## 2019-11-01 DIAGNOSIS — M530 Cervicocranial syndrome: Secondary | ICD-10-CM | POA: Diagnosis not present

## 2019-11-01 DIAGNOSIS — M9901 Segmental and somatic dysfunction of cervical region: Secondary | ICD-10-CM | POA: Diagnosis not present

## 2019-11-01 DIAGNOSIS — M47816 Spondylosis without myelopathy or radiculopathy, lumbar region: Secondary | ICD-10-CM | POA: Diagnosis not present

## 2019-11-01 DIAGNOSIS — M62830 Muscle spasm of back: Secondary | ICD-10-CM | POA: Diagnosis not present

## 2019-11-06 DIAGNOSIS — M530 Cervicocranial syndrome: Secondary | ICD-10-CM | POA: Diagnosis not present

## 2019-11-06 DIAGNOSIS — M9903 Segmental and somatic dysfunction of lumbar region: Secondary | ICD-10-CM | POA: Diagnosis not present

## 2019-11-06 DIAGNOSIS — M62830 Muscle spasm of back: Secondary | ICD-10-CM | POA: Diagnosis not present

## 2019-11-06 DIAGNOSIS — M9902 Segmental and somatic dysfunction of thoracic region: Secondary | ICD-10-CM | POA: Diagnosis not present

## 2019-11-06 DIAGNOSIS — M47816 Spondylosis without myelopathy or radiculopathy, lumbar region: Secondary | ICD-10-CM | POA: Diagnosis not present

## 2019-11-06 DIAGNOSIS — M9901 Segmental and somatic dysfunction of cervical region: Secondary | ICD-10-CM | POA: Diagnosis not present

## 2019-11-07 DIAGNOSIS — M9902 Segmental and somatic dysfunction of thoracic region: Secondary | ICD-10-CM | POA: Diagnosis not present

## 2019-11-07 DIAGNOSIS — M9901 Segmental and somatic dysfunction of cervical region: Secondary | ICD-10-CM | POA: Diagnosis not present

## 2019-11-07 DIAGNOSIS — M47816 Spondylosis without myelopathy or radiculopathy, lumbar region: Secondary | ICD-10-CM | POA: Diagnosis not present

## 2019-11-07 DIAGNOSIS — M9903 Segmental and somatic dysfunction of lumbar region: Secondary | ICD-10-CM | POA: Diagnosis not present

## 2019-11-07 DIAGNOSIS — M62830 Muscle spasm of back: Secondary | ICD-10-CM | POA: Diagnosis not present

## 2019-11-07 DIAGNOSIS — M530 Cervicocranial syndrome: Secondary | ICD-10-CM | POA: Diagnosis not present

## 2019-11-08 DIAGNOSIS — M530 Cervicocranial syndrome: Secondary | ICD-10-CM | POA: Diagnosis not present

## 2019-11-08 DIAGNOSIS — M62830 Muscle spasm of back: Secondary | ICD-10-CM | POA: Diagnosis not present

## 2019-11-08 DIAGNOSIS — M47816 Spondylosis without myelopathy or radiculopathy, lumbar region: Secondary | ICD-10-CM | POA: Diagnosis not present

## 2019-11-08 DIAGNOSIS — M9901 Segmental and somatic dysfunction of cervical region: Secondary | ICD-10-CM | POA: Diagnosis not present

## 2019-11-08 DIAGNOSIS — M9903 Segmental and somatic dysfunction of lumbar region: Secondary | ICD-10-CM | POA: Diagnosis not present

## 2019-11-08 DIAGNOSIS — M9902 Segmental and somatic dysfunction of thoracic region: Secondary | ICD-10-CM | POA: Diagnosis not present

## 2019-11-09 MED FILL — SIMPONI 100 MG/ML SOAJ: 100 | 28 days supply | Qty: 1 | Fill #7

## 2019-11-21 MED FILL — SERTRALINE HCL 50 MG TABLET: 50 | 30 days supply | Qty: 30 | Fill #7

## 2019-11-21 MED FILL — NORETHINDRONE 0.35 MG TAB: 0.35 | 84 days supply | Qty: 84 | Fill #4

## 2019-12-01 ENCOUNTER — Other Ambulatory Visit: Payer: Self-pay | Admitting: Internal Medicine

## 2019-12-01 MED FILL — LIALDA 1.2 GM TABLET SA: 1.2 | 90 days supply | Qty: 180 | Fill #0

## 2019-12-06 MED FILL — SIMPONI 100 MG/ML SOAJ: 100 | 28 days supply | Qty: 1 | Fill #8

## 2019-12-21 ENCOUNTER — Other Ambulatory Visit: Payer: Self-pay | Admitting: Obstetrics and Gynecology

## 2019-12-21 DIAGNOSIS — Z1231 Encounter for screening mammogram for malignant neoplasm of breast: Secondary | ICD-10-CM

## 2019-12-21 MED FILL — SERTRALINE HCL 50 MG TABLET: 50 | 30 days supply | Qty: 30 | Fill #8

## 2020-01-08 ENCOUNTER — Ambulatory Visit (HOSPITAL_BASED_OUTPATIENT_CLINIC_OR_DEPARTMENT_OTHER): Payer: 59 | Admitting: Pharmacist

## 2020-01-08 ENCOUNTER — Telehealth: Payer: Self-pay | Admitting: Pharmacist

## 2020-01-08 ENCOUNTER — Other Ambulatory Visit: Payer: Self-pay

## 2020-01-08 ENCOUNTER — Encounter: Payer: Self-pay | Admitting: Pharmacist

## 2020-01-08 DIAGNOSIS — Z79899 Other long term (current) drug therapy: Secondary | ICD-10-CM

## 2020-01-08 NOTE — Telephone Encounter (Signed)
Called patient to schedule an appointment for the [facility]. I was unable to reach the patient so I left a HIPAA-compliant message requesting that the patient return my call.

## 2020-01-08 NOTE — Progress Notes (Signed)
   S: Patient presents for review of their specialty medication therapy.  Patient is currently taking Simponi for ulcerative colitis. Patient is managed by Dr. Hilarie Fredrickson for this.   Adherence: confirms   Efficacy: reports that she is complete remission!  Denies any adverse effects.  Dosing:  Ulcerative colitis: 100 mg every 4 weeks   Screening: TB test: negative 08/2017 Hepatitis: negative 04/2015  Monitoring: S/sx of infection: denies CBC: see below S/sx of hypersensitivity: denies S/sx of malignancy: denies  S/sx of heart failure: denies  S/sx of autoimmune disorder: denies   O:  Lab Results  Component Value Date   WBC 4.9 08/04/2019   HGB 14.6 08/04/2019   HCT 44.1 08/04/2019   MCV 90.2 08/04/2019   PLT 279 08/04/2019      Chemistry      Component Value Date/Time   NA 138 08/04/2019 0914   K 4.3 08/04/2019 0914   CL 103 08/04/2019 0914   CO2 26 08/04/2019 0914   BUN 13 08/04/2019 0914   CREATININE 0.71 08/04/2019 0914      Component Value Date/Time   CALCIUM 9.2 08/04/2019 0914   ALKPHOS 75 12/12/2018 1235   AST 14 08/04/2019 0914   ALT 9 08/04/2019 0914   BILITOT 0.6 08/04/2019 0914      A/P: 1. Medication review: Patient currently on Simponi for ulcerative colitis. Reviewed the medication with the patient, including the following: Simponi, golimumab, is a TNF? blocker.  Patient educated on purpose, proper use and potential adverse effects of Simponi. There is an increased risk of infection and malignancy with this medication. Do not give patients live vaccinations while they are on this medication. No recommendations for changes.    Benard Halsted, PharmD, [place] [phone]

## 2020-01-10 MED FILL — SIMPONI 100 MG/ML SOAJ: 100 | 28 days supply | Qty: 1 | Fill #9

## 2020-01-22 MED FILL — SERTRALINE HCL 50 MG TABLET: 50 | 30 days supply | Qty: 30 | Fill #9

## 2020-01-29 ENCOUNTER — Other Ambulatory Visit: Payer: Self-pay | Admitting: Pharmacist

## 2020-01-29 ENCOUNTER — Other Ambulatory Visit: Payer: Self-pay | Admitting: Internal Medicine

## 2020-01-29 MED ORDER — SIMPONI 100 MG/ML ~~LOC~~ SOAJ: SUBCUTANEOUS | 11 refills | Status: DC

## 2020-02-05 ENCOUNTER — Other Ambulatory Visit: Payer: Self-pay

## 2020-02-05 ENCOUNTER — Ambulatory Visit
Admission: RE | Admit: 2020-02-05 | Discharge: 2020-02-05 | Disposition: A | Discharge status: Home / Self Care | Payer: 59 | Source: Ambulatory Visit | Attending: Obstetrics and Gynecology | Admitting: Obstetrics and Gynecology

## 2020-02-05 DIAGNOSIS — Z1231 Encounter for screening mammogram for malignant neoplasm of breast: Secondary | ICD-10-CM | POA: Diagnosis not present

## 2020-02-05 MED FILL — SIMPONI 100 MG/ML SOAJ: 100 | 28 days supply | Qty: 1 | Fill #0

## 2020-02-12 ENCOUNTER — Other Ambulatory Visit: Payer: 59

## 2020-02-12 ENCOUNTER — Encounter: Payer: Self-pay | Admitting: Internal Medicine

## 2020-02-12 ENCOUNTER — Other Ambulatory Visit: Payer: Self-pay | Admitting: Internal Medicine

## 2020-02-12 ENCOUNTER — Ambulatory Visit: Payer: 59 | Admitting: Internal Medicine

## 2020-02-12 VITALS — BP 114/72 | HR 75 | Ht 64.0 in | Wt 162.0 lb

## 2020-02-12 DIAGNOSIS — K5150 Left sided colitis without complications: Secondary | ICD-10-CM | POA: Diagnosis not present

## 2020-02-12 DIAGNOSIS — Z79899 Other long term (current) drug therapy: Secondary | ICD-10-CM

## 2020-02-12 DIAGNOSIS — M130 Polyarthritis, unspecified: Secondary | ICD-10-CM | POA: Diagnosis not present

## 2020-02-12 MED ORDER — LIALDA 1.2 G PO TBEC
DELAYED_RELEASE_TABLET | ORAL | 3 refills | Status: DC
Start: 2020-02-12 — End: 2020-02-12

## 2020-02-12 MED FILL — LIALDA 1.2 GM TABLET SA: 1.2 | 90 days supply | Qty: 180 | Fill #0

## 2020-02-12 NOTE — Progress Notes (Signed)
Subjective:    Patient ID: [name], female    DOB: [birth year], 54 y.o.   MRN: [id number]  HPI [name] is a 54 year old female with a history of chronic longstanding left-sided ulcerative colitis on Simponi, history of pyoderma gangrenosum left lower extremity, history of polyarthritis felt secondary to IBD who is here for follow-up.  She is here alone today and was last seen in September 2020.  She reports she is doing very well.  Colitis symptoms are in complete clinical remission she reports "I would not even know I have ulcerative colitis".  No abdominal pain.  No rectal bleeding or blood in stool.  She has 1 formed bowel movement daily.  She is continue Lialda 2.4 g daily and Simponi 100 mg every 28 days.  Our attempt to move the dose to every 21 days for joint pains was denied by her insurance.  She does continue to have bilateral hand pain about a week before her next injection of Simponi.  She has been working recently with Dr. Deatra Robinson with chiropractic medicine.  She has found this to be very helpful with a lot of her previous joint pain.  That said she still has bilateral hand pain which is manageable with Tylenol and heat packs when needed.  She does see dermatology annually, Dr. Allyson Sabal  Review of Systems As per HPI, otherwise negative  Current Medications, Allergies, Past Medical History, Past Surgical History, Family History and Social History were reviewed in [facility] record.     Objective:   Physical Exam BP 114/72   Pulse 75   Ht 5' 4"  (1.626 m)   Wt 162 lb (73.5 kg)   SpO2 99%   BMI 27.81 kg/m  Gen: awake, alert, NAD HEENT: anicteric CV: RRR, no mrg Pulm: CTA b/l Abd: soft, NT/ND, +BS throughout Ext: no c/c/e Neuro: nonfocal  CBC    Component Value Date/Time   WBC 4.9 08/04/2019 0914   RBC 4.89 08/04/2019 0914   HGB 14.6 08/04/2019 0914   HCT 44.1 08/04/2019 0914   PLT 279 08/04/2019 0914   MCV 90.2 08/04/2019 0914    MCH 29.9 08/04/2019 0914   MCHC 33.1 08/04/2019 0914   RDW 12.2 08/04/2019 0914   LYMPHSABS 1,387 08/04/2019 0914   MONOABS 0.7 12/12/2018 1235   EOSABS 132 08/04/2019 0914   BASOSABS 39 08/04/2019 0914   CMP     Component Value Date/Time   NA 138 08/04/2019 0914   K 4.3 08/04/2019 0914   CL 103 08/04/2019 0914   CO2 26 08/04/2019 0914   GLUCOSE 98 08/04/2019 0914   BUN 13 08/04/2019 0914   CREATININE 0.71 08/04/2019 0914   CALCIUM 9.2 08/04/2019 0914   PROT 7.0 08/04/2019 0914   ALBUMIN 4.7 12/12/2018 1235   AST 14 08/04/2019 0914   ALT 9 08/04/2019 0914   ALKPHOS 75 12/12/2018 1235   BILITOT 0.6 08/04/2019 0914   GFRNONAA 90 09/02/2017 0925   GFRAA 104 09/02/2017 0925         Assessment & Plan:  54 year old female with a history of chronic longstanding left-sided ulcerative colitis on Simponi, history of pyoderma gangrenosum left lower extremity, history of polyarthritis felt secondary to IBD who is here for follow-up  1.  Left-sided ulcerative colitis, longstanding --in clinical and endoscopic plus histologic remission on Simponi and Lialda.  Last colonoscopy was just over 2 years ago.  We discussed how guidelines support surveillance every 1 to 2 years however given  her endoscopic and histologic remission I think it is reasonable to wait until next September for surveillance.  She is in agreement --Continue Simponi 100 mg every 28 days --Continue Lialda 2.4 g daily --Surveillance colonoscopy September 2022 --Quantiferon gold today; CBC and CMP normal in May --Discuss Pneumovax 23 next year, Pneumovax 13 given in 2020  2.  Polyarthritis --due to IBD.  Also getting help with chiropractor.  3.  Pyoderma gangrenosum --resolved with IBD treatment.  She sees dermatology annually  I will see her next year for colonoscopy, sooner if needed

## 2020-02-12 NOTE — Patient Instructions (Signed)
Your provider has requested that you go to the basement level for lab work before leaving today. Press "B" on the elevator. The lab is located at the first door on the left as you exit the elevator.  We have sent the following medications to your pharmacy for you to pick up at your convenience: [place].   You will be due for a recall colonoscopy in 11/2020. We will send you a reminder in the mail when it gets closer to that time.  Thank you for choosing me and [facility].  Pricilla Riffle. Dagoberto Ligas., MD., Marval Regal

## 2020-02-14 LAB — QUANTIFERON-TB GOLD PLUS
Mitogen-NIL: >10 IU/mL
NIL: 0.04 IU/mL
QuantiFERON-TB Gold Plus: NEGATIVE
TB1-NIL: 0 IU/mL
TB2-NIL: 0 IU/mL

## 2020-02-19 MED FILL — NORETHINDRONE 0.35 MG TAB: 0.35 | 56 days supply | Qty: 56 | Fill #5

## 2020-02-21 MED FILL — SERTRALINE HCL 50 MG TABLET: 50 | 30 days supply | Qty: 30 | Fill #10

## 2020-02-29 ENCOUNTER — Other Ambulatory Visit (HOSPITAL_COMMUNITY): Payer: Self-pay | Admitting: Obstetrics and Gynecology

## 2020-02-29 DIAGNOSIS — N943 Premenstrual tension syndrome: Secondary | ICD-10-CM | POA: Diagnosis not present

## 2020-02-29 DIAGNOSIS — R7303 Prediabetes: Secondary | ICD-10-CM | POA: Diagnosis not present

## 2020-02-29 DIAGNOSIS — Z01419 Encounter for gynecological examination (general) (routine) without abnormal findings: Secondary | ICD-10-CM | POA: Diagnosis not present

## 2020-03-04 MED FILL — SIMPONI 100 MG/ML SOAJ: 100 | 28 days supply | Qty: 1 | Fill #1

## 2020-03-22 ENCOUNTER — Ambulatory Visit: Payer: 59 | Admitting: Family Medicine

## 2020-03-22 ENCOUNTER — Encounter: Payer: Self-pay | Admitting: Family Medicine

## 2020-03-22 ENCOUNTER — Other Ambulatory Visit: Payer: Self-pay

## 2020-03-22 VITALS — BP 112/64 | HR 70 | Temp 98.1°F | Resp 14 | Ht 64.0 in | Wt 162.0 lb

## 2020-03-22 DIAGNOSIS — E782 Mixed hyperlipidemia: Secondary | ICD-10-CM

## 2020-03-22 DIAGNOSIS — E663 Overweight: Secondary | ICD-10-CM | POA: Diagnosis not present

## 2020-03-22 NOTE — Patient Instructions (Signed)
We will call with lab results and medication discussion Wegovy/ozempic Saxenda

## 2020-03-22 NOTE — Assessment & Plan Note (Addendum)
Patient overweight BMI 27.8 with hyperlipidemia.  She does have increased risk for heart disease.  Weight loss of another 10 to 15 pounds would be beneficial to her.  We will check W8E and metabolic panel today.  We did discuss GLP-1 therapy.  Options include Rochel Brome  based on availability Continue Whole 30  Diet, physical activity - at least  150 minutes per week

## 2020-03-22 NOTE — Progress Notes (Signed)
   Subjective:    Patient ID: [name], female    DOB: [birth year], 55 y.o.   MRN: [id number]  Patient presents for Follow-up (Phentermine/) Patient here for follow-up on phentermine.  She was started on low-dose at her physical examination back in May.  She has risk for coronary artery disease and diabetes mellitus she has hyperlipidemia with a BMI over 27. Weight at her visit in May was 161 pounds She did try the phentermine but did not have any significant weight loss so she discontinued.  She been talking with her gynecologist and her gynecologist was concerned for insulin resistance and asked her to discuss with me use of GLP-1 therapy to help with weight loss.  She has restarted her medication and is interested.  She does not have any history of pancreatitis no family history of thyroid cancer.  Has been sticking with a plant-based whole 30 diet and she has been working out regularly at [employer].  She has maintained the same weight she had months back but is concerned that she is not losing. She does have hyperlipidemia which we have been trying to treat with dietary changes   Review Of Systems:  GEN- denies fatigue, fever, weight loss,weakness, recent illness HEENT- denies eye drainage, change in vision, nasal discharge, CVS- denies chest pain, palpitations RESP- denies SOB, cough, wheeze ABD- denies N/V, change in stools, abd pain GU- denies dysuria, hematuria, dribbling, incontinence MSK- denies joint pain, muscle aches, injury Neuro- denies headache, dizziness, syncope, seizure activity       Objective:    BP 112/64   Pulse 70   Temp 98.1 F (36.7 C) (Temporal)   Resp 14   Ht 5' 4"  (1.626 m)   Wt 162 lb (73.5 kg)   SpO2 99%   BMI 27.81 kg/m  GEN- NAD, alert and oriented x3 HEENT- PERRL, EOMI, non injected sclera, pink conjunctiva, MMM, oropharynx clear Neck- Supple, no thyromegaly CVS- RRR, no murmur RESP-CTAB ABD-NABS,soft,NT,ND EXT- No edema Pulses-  Radial 2+        Assessment & Plan:      Problem List Items Addressed This Visit      Unprioritized   Mixed hyperlipidemia - Primary   Relevant Orders   Comprehensive metabolic panel   Lipid panel   Overweight (BMI 25.0-29.9)    Patient overweight BMI 27.8 with hyperlipidemia.  She does have increased risk for heart disease.  Weight loss of another 10 to 15 pounds would be beneficial to her.  We will check P8E and metabolic panel today.  We did discuss GLP-1 therapy.  Options include Rochel Brome  based on availability Continue Whole 30  Diet, physical activity - at least  150 minutes per week      Relevant Orders   Hemoglobin A1c      Note: This dictation was prepared with Dragon dictation along with smaller phrase technology. Any transcriptional errors that result from this process are unintentional.

## 2020-03-23 LAB — COMPREHENSIVE METABOLIC PANEL
AG Ratio: 1.6 (calc) (ref 1.0–2.5)
ALT: 13 U/L (ref 6–29)
AST: 18 U/L (ref 10–35)
Albumin: 4.7 g/dL (ref 3.6–5.1)
Alkaline phosphatase (APISO): 79 U/L (ref 37–153)
BUN: 12 mg/dL (ref 7–25)
CO2: 28 mmol/L (ref 20–32)
Calcium: 9.5 mg/dL (ref 8.6–10.4)
Chloride: 103 mmol/L (ref 98–110)
Creat: 0.62 mg/dL (ref 0.50–1.05)
Globulin: 2.9 g/dL (calc) (ref 1.9–3.7)
Glucose, Bld: 91 mg/dL (ref 65–99)
Potassium: 4.4 mmol/L (ref 3.5–5.3)
Sodium: 138 mmol/L (ref 135–146)
Total Bilirubin: 0.4 mg/dL (ref 0.2–1.2)
Total Protein: 7.6 g/dL (ref 6.1–8.1)

## 2020-03-23 LAB — HEMOGLOBIN A1C
Hgb A1c MFr Bld: 5.3 % of total Hgb (ref <5.7)
Mean Plasma Glucose: 105 mg/dL
eAG (mmol/L): 5.8 mmol/L

## 2020-03-23 LAB — LIPID PANEL
Cholesterol: 246 mg/dL — ABNORMAL HIGH (ref <200)
HDL: 53 mg/dL (ref >=50)
LDL Cholesterol (Calc): 166 mg/dL (calc) — ABNORMAL HIGH
Non-HDL Cholesterol (Calc): 193 mg/dL (calc) — ABNORMAL HIGH (ref <130)
Total CHOL/HDL Ratio: 4.6 (calc) (ref <5.0)
Triglycerides: 134 mg/dL (ref <150)

## 2020-03-25 ENCOUNTER — Other Ambulatory Visit: Payer: Self-pay | Admitting: Family Medicine

## 2020-03-25 MED ORDER — SAXENDA 18 MG/3ML ~~LOC~~ SOPN: PEN_INJECTOR | SUBCUTANEOUS | 3 refills | Status: DC

## 2020-03-25 NOTE — Addendum Note (Signed)
Addended by: Vic Blackbird F on: 03/25/2020 05:33 PM   Modules accepted: Orders

## 2020-04-09 MED FILL — SIMPONI 100 MG/ML SOAJ: 100 | 28 days supply | Qty: 1 | Fill #2

## 2020-04-12 ENCOUNTER — Telehealth: Payer: Self-pay | Admitting: *Deleted

## 2020-04-12 NOTE — Telephone Encounter (Signed)
Received request from pharmacy for PA on   PA submitted.   Dx: E66.9- obesity.   Your information has been sent to [facility].

## 2020-04-15 ENCOUNTER — Other Ambulatory Visit: Payer: Self-pay | Admitting: Family Medicine

## 2020-04-15 ENCOUNTER — Other Ambulatory Visit: Payer: Self-pay | Admitting: *Deleted

## 2020-04-15 MED ORDER — SAXENDA 18 MG/3ML ~~LOC~~ SOPN
PEN_INJECTOR | SUBCUTANEOUS | 3 refills | Status: DC
Start: 2020-04-15 — End: 2020-04-15

## 2020-04-15 MED ORDER — BD PEN NEEDLE NANO 2ND GEN 32G X 4 MM MISC: 1 refills | Status: DC

## 2020-04-15 NOTE — Telephone Encounter (Signed)
Received PA determination.   PA 90092 approved 04/15/2020- 08/12/2020.  Pharmacy made aware.

## 2020-04-17 MED FILL — SERTRALINE HCL 50 MG TABS: 50 | 30 days supply | Qty: 30 | Fill #0

## 2020-04-19 MED FILL — UNIFINE PENTIPS 32GX5/32: 32G X 4 MM | 90 days supply | Qty: 100 | Fill #0

## 2020-04-19 MED FILL — SAXENDA 18 MG/3 ML PEN: 18 | 30 days supply | Qty: 15 | Fill #0

## 2020-05-10 MED FILL — SIMPONI 100 MG/ML SOAJ: 100 | 28 days supply | Qty: 1 | Fill #3

## 2020-05-13 MED FILL — SERTRALINE HCL 50 MG TABS: 50 | 30 days supply | Qty: 30 | Fill #1

## 2020-06-05 MED FILL — SIMPONI 100 MG/ML SOAJ: 100 | 28 days supply | Qty: 1 | Fill #4

## 2020-06-13 ENCOUNTER — Other Ambulatory Visit (HOSPITAL_COMMUNITY): Payer: Self-pay

## 2020-06-18 MED FILL — Mesalamine Tab Delayed Release 1.2 GM: ORAL | 90 days supply | Qty: 180 | Fill #0 | Status: AC

## 2020-06-18 MED FILL — Sertraline HCl Tab 50 MG: ORAL | 30 days supply | Qty: 30 | Fill #0 | Status: AC

## 2020-06-19 ENCOUNTER — Other Ambulatory Visit (HOSPITAL_COMMUNITY): Payer: Self-pay

## 2020-07-01 ENCOUNTER — Other Ambulatory Visit: Payer: Self-pay

## 2020-07-01 ENCOUNTER — Ambulatory Visit (HOSPITAL_BASED_OUTPATIENT_CLINIC_OR_DEPARTMENT_OTHER): Payer: 59 | Admitting: Nurse Practitioner

## 2020-07-01 ENCOUNTER — Encounter (HOSPITAL_BASED_OUTPATIENT_CLINIC_OR_DEPARTMENT_OTHER): Payer: Self-pay | Admitting: Nurse Practitioner

## 2020-07-01 ENCOUNTER — Other Ambulatory Visit (HOSPITAL_COMMUNITY): Payer: Self-pay

## 2020-07-01 VITALS — BP 125/67 | HR 66 | Ht 64.0 in | Wt 149.8 lb

## 2020-07-01 DIAGNOSIS — L88 Pyoderma gangrenosum: Secondary | ICD-10-CM | POA: Diagnosis not present

## 2020-07-01 DIAGNOSIS — K5150 Left sided colitis without complications: Secondary | ICD-10-CM

## 2020-07-01 DIAGNOSIS — E782 Mixed hyperlipidemia: Secondary | ICD-10-CM | POA: Diagnosis not present

## 2020-07-01 DIAGNOSIS — D849 Immunodeficiency, unspecified: Secondary | ICD-10-CM | POA: Diagnosis not present

## 2020-07-01 DIAGNOSIS — E663 Overweight: Secondary | ICD-10-CM

## 2020-07-01 DIAGNOSIS — Z7689 Persons encountering health services in other specified circumstances: Secondary | ICD-10-CM | POA: Diagnosis not present

## 2020-07-01 DIAGNOSIS — Z0000 Encounter for general adult medical examination without abnormal findings: Secondary | ICD-10-CM | POA: Insufficient documentation

## 2020-07-01 MED ORDER — INSULIN PEN NEEDLE 32G X 4 MM MISC
1 refills | Status: AC
  Filled 2020-07-01: qty 100, 100d supply, fill #0
  Filled 2020-07-05: qty 100, 90d supply, fill #0
  Filled 2021-02-03: qty 100, 90d supply, fill #1

## 2020-07-01 MED ORDER — LIRAGLUTIDE -WEIGHT MANAGEMENT 18 MG/3ML ~~LOC~~ SOPN
3.0000 mg | PEN_INJECTOR | Freq: Every day | SUBCUTANEOUS | 11 refills | Status: DC
  Filled 2020-07-01: qty 15, 30d supply, fill #0
  Filled 2020-07-30: qty 15, 30d supply, fill #1
  Filled 2020-09-03 – 2020-09-04 (×2): qty 15, 30d supply, fill #2
  Filled 2020-10-06: qty 15, 30d supply, fill #3
  Filled 2020-11-04: qty 15, 30d supply, fill #4
  Filled 2020-11-27: qty 15, 30d supply, fill #5
  Filled 2021-01-02: qty 15, 30d supply, fill #6
  Filled 2021-02-03: qty 15, 30d supply, fill #7
  Filled 2021-03-13: qty 15, 30d supply, fill #8
  Filled 2021-04-04: qty 15, 30d supply, fill #9

## 2020-07-01 MED FILL — Insulin Pen Needle 32 G X 4 MM (1/6" or 5/32"): 90 days supply | Qty: 100 | Fill #0 | Status: CN

## 2020-07-01 MED FILL — Liraglutide (Weight Mngmt) Soln Pen-Inj 18 MG/3ML (6 MG/ML): SUBCUTANEOUS | 28 days supply | Qty: 15 | Fill #0 | Status: CN

## 2020-07-01 NOTE — Assessment & Plan Note (Signed)
We healed wound with no signs of opening or infection present. Left anterolateral lower extremity scar present. Previous treatment with the wound care center. No additional wounds present.

## 2020-07-01 NOTE — Assessment & Plan Note (Signed)
Chronic management with Simponi and Lialda without exacerbation of symptoms in quite some time. No warning signs or changes noted today. She is due for colonoscopy, which has been scheduled by GI for later this fall- GI monitoring closely.

## 2020-07-01 NOTE — Assessment & Plan Note (Signed)
Doing very well with weight loss on daily 0.62m liraglutide injections. So far >10lb weight loss.  Recommend continue with medication and increase physical activity to a minimum of 20 minutes per day 5 days a week. Refills provided for medication.  Plan to follow-up in 6 months or sooner if needed.

## 2020-07-01 NOTE — Progress Notes (Signed)
New Patient Office Visit  Subjective:  Patient ID: [name], female    DOB: Jun 23, 1965  Age: 55 y.o. MRN: [id number]  CC:  Chief Complaint  Patient presents with  . Establish Care    HPI [name] is a very pleasant 55 year old female presenting today to establish care.   She reports a medical history consistent with: Past Medical History:  Diagnosis Date  . DDD (degenerative disc disease), lumbar    S-I joints  . Heart murmur   . Hyperlipidemia   . Superficial granulomatous pyoderma   . Ulcerative colitis    She reports and echocardiogram showing no prolapse of the valves within the heart. She was informed her murmur is benign.   She is working to lose weight and reduce her cholesterol with the aid of daily injections of liraglutide. She started this medication in mid January and has seen a weight loss of about 16 pounds. She reports that she feels better and has more energy since starting the medication. She does not want to start on statin therapy at this time and is hoping to have her cholesterol under better control with weight loss and increased activity.   She has a history of ulcerative colitis for which she is followed by Dr. Hilarie Fredrickson. She is currently taking Lialda 1.2g daily and Simponi (golimumab) injections every 21 days. She reports that she has been in remission for quite some time with this regimen. She is due for a colonoscopy in October and Dr. Hilarie Fredrickson will be doing this.   She has a history of granulomatous pyoderma related to her ulcerative colitis. One lesion on her lower left leg anterolaterally has healed well and skin is intact. No additional lesions have presented.   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  She does not drink alcohol, use nicotine products, or recreational  drugs.  She is not currently sexually active and has no concerns with STI's today.  She is still having menstrual periods that are regular in timing and flow. She was taking sertraline for hot flashes, but found that they help her mood and therefore has continued on this.   Her last colonoscopy was in 2020, her last mammogram and pap smear were in 2021. She is up to date on flu, tetanus, shingles, and pneumonia vaccines.  Past Surgical History:  Procedure Laterality Date  . ABDOMINAL HERNIA REPAIR     age 68  . COLONOSCOPY  05/12/2012   multiple   . POLYPECTOMY    . TONSILLECTOMY      Family History  Problem Relation Age of Onset  . Macular degeneration Father   . Lung cancer Father 56  . Breast cancer Mother   . Hyperlipidemia Mother   . Glaucoma Mother   . Colon cancer Neg Hx   . Colon polyps Neg Hx   . Esophageal cancer Neg Hx   . Rectal cancer Neg Hx   . Stomach cancer Neg Hx     Social History   Socioeconomic History  . Marital status: Single    Spouse name: Not on file  . Number of children: 2  . Years of education: Not on file  . Highest education level: Not on file  Occupational History  . Occupation: Marine scientist, [employer]   Tobacco Use  . Smoking status: Never Smoker  . Smokeless tobacco: Never Used  Vaping Use  . Vaping Use: Never used  Substance and Sexual Activity  . Alcohol use: Yes    Comment: rare  . Drug use: No  . Sexual activity: Not on file  Other Topics Concern  . Not on file  Social History Narrative  . Not on file   Social Determinants of Health   Financial Resource Strain: Not on file  Food Insecurity: Not on file  Transportation Needs: Not on file  Physical Activity: Not on file  Stress: Not on file  Social Connections: Not on file  Intimate Partner Violence: Not on file    ROS Review of Systems  Objective:   Today's Vitals: BP 125/67   Pulse 66   Ht 5' 4"  (1.626 m)   Wt 149 lb 12.8 oz (67.9 kg)   SpO2 100%   BMI 25.71  kg/m   Physical Exam Vitals and nursing note reviewed.  Constitutional:      Appearance: Normal appearance. She is normal weight.  HENT:     Head: Normocephalic and atraumatic.     Right Ear: Tympanic membrane, ear canal and external ear normal.     Left Ear: Tympanic membrane, ear canal and external ear normal.     Nose:     Comments: Mask in place     Mouth/Throat:     Comments: Mask in place Eyes:     Extraocular Movements: Extraocular movements intact.     Conjunctiva/sclera: Conjunctivae normal.     Pupils: Pupils are equal, round, and reactive to light.  Neck:     Vascular: No carotid bruit.  Cardiovascular:     Rate and Rhythm: Normal rate and regular rhythm.     Pulses: Normal pulses.     Heart sounds: Murmur heard.    Pulmonary:     Effort: Pulmonary effort is normal.     Breath sounds: Normal breath sounds. No wheezing or rhonchi.  Abdominal:     General: Abdomen is flat. Bowel sounds are normal. There is no distension.     Palpations: Abdomen is soft.     Tenderness: There is no abdominal tenderness. There is no right CVA tenderness, left CVA tenderness or guarding.  Musculoskeletal:        General: Normal range of motion.     Cervical back: Normal range of motion. No rigidity or tenderness.     Right lower leg: No edema.     Left lower leg: No edema.  Lymphadenopathy:     Cervical: No cervical adenopathy.  Skin:    General: Skin is warm and dry.     Capillary Refill: Capillary refill takes less than 2 seconds.  Neurological:     General: No focal deficit present.     Mental Status: She is alert and oriented to person, place, and time.     Cranial Nerves: No cranial nerve deficit.     Sensory: No sensory deficit.     Motor: No weakness.     Coordination: Coordination normal.     Gait: Gait normal.  Psychiatric:        Mood and Affect: Mood normal.        Behavior: Behavior normal.        Thought Content: Thought content normal.        Judgment:  Judgment normal.     Assessment & Plan:   Problem List Items  Addressed This Visit      Digestive   LEFT SIDED ULCERATIVE COLITIS    Chronic management with Simponi and Lialda without exacerbation of symptoms in quite some time. No warning signs or changes noted today. She is due for colonoscopy, which has been scheduled by GI for later this fall- GI monitoring closely.         Musculoskeletal and Integument   Pyoderma gangrenosum    We healed wound with no signs of opening or infection present. Left anterolateral lower extremity scar present. Previous treatment with the wound care center. No additional wounds present.         Other   Overweight (BMI 25.0-29.9)    Doing very well with weight loss on daily 0.50m liraglutide injections. So far >10lb weight loss.  Recommend continue with medication and increase physical activity to a minimum of 20 minutes per day 5 days a week. Refills provided for medication.  Plan to follow-up in 6 months or sooner if needed.       Relevant Medications   Insulin Pen Needle 32G X 4 MM MISC   Liraglutide -Weight Management 18 MG/3ML SOPN   Mixed hyperlipidemia    Diet controlled at this time. Working to loose weight and increase physical activity to avoid medication management. Positive results for weight loss.  Will plan to recheck in about 6 months.       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  No signs of infection present today. Patient is aware of warning signs that would warrant immediate evaluation.  Will continue to follow.       Encounter to establish care - Primary    [place]  Review of current and past medical history, surgical history, social history, and family history performed.  Review of previous providers and medications for ongoing therapy.  Will plan for CPE in about 6 months for labs.          Outpatient  Encounter Medications as of 07/01/2020  Medication Sig  . Golimumab 100 MG/ML SOAJ INJECT 100 MG INTO THE SKIN EVERY 21 DAYS. (Patient taking differently: INJECT 100 MG INTO THE SKIN EVERY 28 DAYS.)  . LIALDA 1.2 g EC tablet TAKE 2 TABLETS BY MOUTH DAILY WITH BREAKFAST  . sertraline (ZOLOFT) 50 MG tablet TAKE 1 TABLET BY MOUTH DAILY  . [DISCONTINUED] Ascorbic Acid (VITAMIN C) 1000 MG tablet Take by mouth daily.  . [DISCONTINUED] Insulin Pen Needle 32G X 4 MM MISC USE AS DIRECTED WITH SAXENDA  . [DISCONTINUED] Liraglutide -Weight Management 18 MG/3ML SOPN INJECT 0.6 MG INTO THE SKIN ONCE DAILY THEN INCREASE WEEKLY BY 0.6MG UNTIL MAXIMUM DOSE OF 3MG ONCE DAILY REACHED.  . [DISCONTINUED] Multiple Vitamin (MULTIVITAMIN) tablet Take 1 tablet by mouth daily.  . [DISCONTINUED] PRESCRIPTION MEDICATION OBC- progesterone  . [DISCONTINUED] sertraline (ZOLOFT) 50 MG tablet Take 50 mg by mouth daily.  . [DISCONTINUED] Vitamin D, Cholecalciferol, 50 MCG (2000 UT) CAPS Take by mouth.  . Insulin Pen Needle 32G X 4 MM MISC USE AS DIRECTED WITH SAXENDA  . Liraglutide -Weight Management 18 MG/3ML SOPN Inject 3 mg into the skin daily.  . [DISCONTINUED] norethindrone (ORTHO MICRONOR) 0.35 MG tablet Take 1 tablet (0.35 mg total) by mouth daily.   No facility-administered encounter medications on file as of 07/01/2020.    Follow-up: Return in about 6 months (around 12/31/2020).   SOrma Render NP

## 2020-07-01 NOTE — Assessment & Plan Note (Signed)
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  No signs of infection present today. Patient is aware of warning signs that would warrant immediate evaluation.  Will continue to follow.

## 2020-07-01 NOTE — Patient Instructions (Signed)
Recommendations from today's visit:  We can plan for your annual physical exam after May 21st- you may schedule this today on your way out with Judson Roch or [place] at the front desk.   Labs were just drawn in January, so no need to repeat at this time. We will plan for labs with your physical.   It shows that you are overdue for your colonoscopy. I can send a referral to Dr. Hilarie Fredrickson, if you would like, to get scheduled to have this done.   _________________________________________________________________________________________________ Thank you for choosing [facility] [facility] for your Primary Care needs. I am excited for the opportunity to partner with you to meet your health care goals. It was a pleasure meeting you today!  I am an Adult-Geriatric Nurse Practitioner with a background in caring for patients for more than 20 years. I received my Paediatric nurse in Nursing and my Doctor of Nursing Practice degrees at [employer] and I received additional fellowship training in primary care and sports medicine after receiving my doctorate degree. I provide primary care and sports medicine services to patients age 53 and older within this office. I am also a provider with the [facility] and the director of the APP Fellowship with [facility].  I am a [place] native, but have called the [place] area home for nearly 20 years and am proud to be a member of this community.   I am passionate about providing the best service to you through preventive medicine and supportive care. I consider you a part of the medical team and value your input. I work diligently to ensure that you are heard and your needs are met in a safe and effective manner. I want you to feel comfortable with me as your provider and want you to know that your health concerns are important to me.   For your information, our office hours are Monday- Friday 8:00 AM -  5:00 PM At this time I am not in the office on Wednesdays.  If you have questions or concerns, please call our office at [phone] or send [place] a MyChart message and we will respond as quickly as possible.   For all urgent or time sensitive needs we ask that you please call the office to avoid delays. MyChart is not constantly monitored and replies may take up to 72 business hours.  MyChart Policy: . MyChart allows for you to see your visit notes, after visit summary, provider recommendations, lab and tests results, make an appointment, request refills, and contact your provider or the office for non-urgent questions or concerns.  . Providers are seeing patients during normal business hours and do not have built in time to review MyChart messages. We ask that you allow a minimum of 72 business hours for MyChart message responses.  . Complex MyChart concerns may require a visit. Your provider may request you schedule a virtual or in person visit to ensure we are providing the best care possible. . MyChart messages sent after 4:00 PM on Friday will not be received by the provider until Monday morning.    Lab and Test Results: . You will receive your lab and test results on MyChart as soon as they are completed and results have been sent by the lab or testing facility. Due to this service, you will receive your results BEFORE your provider.  . Please allow a minimum of 72 business hours for your provider to receive  and review lab and test results and contact you about.   . Most lab and test result comments from the provider will be sent through [place]. Your provider may recommend changes to the plan of care, follow-up visits, repeat testing, ask questions, or request an office visit to discuss these results. You may reply directly to this message or call the office at [phone] to provide information for the provider or set up an appointment. . In some instances, you will be called with test  results and recommendations. Please let us know if this is preferred and we will make note of this in your chart to provide this for you.    . If you have not heard a response to your lab or test results in 72 business hours, please call the office to let us know.   After Hours: . For all non-emergency after hours needs, please call the office at [phone] and select the option to reach the on-call provider service. On-call services are shared between multiple [place] offices and therefore it will not be possible to speak directly with your provider. On-call providers may provide medical advice and recommendations, but are unable to provide refills for maintenance medications.  . For all emergency or urgent medical needs after normal business hours, we recommend that you seek care at the closest Urgent Care or Emergency Department to ensure appropriate treatment in a timely manner.  [facility] [place] [facility] has a 24 hour emergency room located on the ground floor for your convenience.    Please do not hesitate to reach out to [place] with concerns.   Thank you, again, for choosing me as your health care partner. I appreciate your trust and look forward to learning more about you.   [name], DNP, AGNP-c ___________________________________________________________________________________

## 2020-07-01 NOTE — Assessment & Plan Note (Signed)
Diet controlled at this time. Working to loose weight and increase physical activity to avoid medication management. Positive results for weight loss.  Will plan to recheck in about 6 months.

## 2020-07-01 NOTE — Assessment & Plan Note (Signed)
New patient to this practice from [place].  Review of current and past medical history, surgical history, social history, and family history performed.  Review of previous providers and medications for ongoing therapy.  Will plan for CPE in about 6 months for labs.

## 2020-07-05 ENCOUNTER — Other Ambulatory Visit (HOSPITAL_COMMUNITY): Payer: Self-pay

## 2020-07-05 MED FILL — Golimumab Subcutaneous Soln Auto-injector 100 MG/ML: SUBCUTANEOUS | 28 days supply | Qty: 1 | Fill #0 | Status: AC

## 2020-07-11 ENCOUNTER — Other Ambulatory Visit (HOSPITAL_COMMUNITY): Payer: Self-pay

## 2020-07-17 MED FILL — Sertraline HCl Tab 50 MG: ORAL | 30 days supply | Qty: 30 | Fill #1 | Status: AC

## 2020-07-18 ENCOUNTER — Other Ambulatory Visit (HOSPITAL_COMMUNITY): Payer: Self-pay

## 2020-07-19 ENCOUNTER — Other Ambulatory Visit (HOSPITAL_COMMUNITY): Payer: Self-pay

## 2020-07-30 ENCOUNTER — Other Ambulatory Visit (HOSPITAL_COMMUNITY): Payer: Self-pay

## 2020-07-31 ENCOUNTER — Other Ambulatory Visit (HOSPITAL_COMMUNITY): Payer: Self-pay

## 2020-08-05 ENCOUNTER — Other Ambulatory Visit (HOSPITAL_COMMUNITY): Payer: Self-pay

## 2020-08-05 MED FILL — Golimumab Subcutaneous Soln Auto-injector 100 MG/ML: SUBCUTANEOUS | 28 days supply | Qty: 1 | Fill #1 | Status: AC

## 2020-08-07 ENCOUNTER — Other Ambulatory Visit (HOSPITAL_COMMUNITY): Payer: Self-pay

## 2020-08-08 ENCOUNTER — Other Ambulatory Visit (HOSPITAL_COMMUNITY): Payer: Self-pay

## 2020-08-19 MED FILL — Sertraline HCl Tab 50 MG: ORAL | 30 days supply | Qty: 30 | Fill #2 | Status: AC

## 2020-08-20 ENCOUNTER — Other Ambulatory Visit (HOSPITAL_COMMUNITY): Payer: Self-pay

## 2020-08-21 ENCOUNTER — Other Ambulatory Visit (HOSPITAL_COMMUNITY): Payer: Self-pay

## 2020-08-27 DIAGNOSIS — H5203 Hypermetropia, bilateral: Secondary | ICD-10-CM | POA: Diagnosis not present

## 2020-09-03 ENCOUNTER — Other Ambulatory Visit (HOSPITAL_COMMUNITY): Payer: Self-pay

## 2020-09-03 MED FILL — Mesalamine Tab Delayed Release 1.2 GM: ORAL | 90 days supply | Qty: 180 | Fill #1 | Status: AC

## 2020-09-04 ENCOUNTER — Other Ambulatory Visit (HOSPITAL_COMMUNITY): Payer: Self-pay

## 2020-09-06 ENCOUNTER — Other Ambulatory Visit (HOSPITAL_COMMUNITY): Payer: Self-pay

## 2020-09-06 MED FILL — Golimumab Subcutaneous Soln Auto-injector 100 MG/ML: SUBCUTANEOUS | 28 days supply | Qty: 1 | Fill #2 | Status: CN

## 2020-09-08 MED FILL — Golimumab Subcutaneous Soln Auto-injector 100 MG/ML: SUBCUTANEOUS | 28 days supply | Qty: 1 | Fill #2 | Status: AC

## 2020-09-10 ENCOUNTER — Other Ambulatory Visit (HOSPITAL_COMMUNITY): Payer: Self-pay

## 2020-09-16 MED FILL — Sertraline HCl Tab 50 MG: ORAL | 30 days supply | Qty: 30 | Fill #3 | Status: AC

## 2020-09-17 ENCOUNTER — Other Ambulatory Visit (HOSPITAL_COMMUNITY): Payer: Self-pay

## 2020-09-26 ENCOUNTER — Other Ambulatory Visit (HOSPITAL_BASED_OUTPATIENT_CLINIC_OR_DEPARTMENT_OTHER): Payer: Self-pay

## 2020-09-30 ENCOUNTER — Other Ambulatory Visit (HOSPITAL_COMMUNITY): Payer: Self-pay

## 2020-10-03 ENCOUNTER — Other Ambulatory Visit (HOSPITAL_COMMUNITY): Payer: Self-pay

## 2020-10-03 DIAGNOSIS — D1801 Hemangioma of skin and subcutaneous tissue: Secondary | ICD-10-CM | POA: Diagnosis not present

## 2020-10-03 DIAGNOSIS — L728 Other follicular cysts of the skin and subcutaneous tissue: Secondary | ICD-10-CM | POA: Diagnosis not present

## 2020-10-03 DIAGNOSIS — L814 Other melanin hyperpigmentation: Secondary | ICD-10-CM | POA: Diagnosis not present

## 2020-10-03 DIAGNOSIS — L853 Xerosis cutis: Secondary | ICD-10-CM | POA: Diagnosis not present

## 2020-10-03 DIAGNOSIS — L718 Other rosacea: Secondary | ICD-10-CM | POA: Diagnosis not present

## 2020-10-03 DIAGNOSIS — L905 Scar conditions and fibrosis of skin: Secondary | ICD-10-CM | POA: Diagnosis not present

## 2020-10-03 DIAGNOSIS — Z85828 Personal history of other malignant neoplasm of skin: Secondary | ICD-10-CM | POA: Diagnosis not present

## 2020-10-03 MED FILL — Golimumab Subcutaneous Soln Auto-injector 100 MG/ML: SUBCUTANEOUS | 28 days supply | Qty: 1 | Fill #3 | Status: AC

## 2020-10-07 ENCOUNTER — Other Ambulatory Visit (HOSPITAL_COMMUNITY): Payer: Self-pay

## 2020-10-08 ENCOUNTER — Other Ambulatory Visit (HOSPITAL_COMMUNITY): Payer: Self-pay

## 2020-10-09 ENCOUNTER — Other Ambulatory Visit (HOSPITAL_COMMUNITY): Payer: Self-pay

## 2020-10-17 MED FILL — Sertraline HCl Tab 50 MG: ORAL | 30 days supply | Qty: 30 | Fill #4 | Status: AC

## 2020-10-18 ENCOUNTER — Other Ambulatory Visit (HOSPITAL_COMMUNITY): Payer: Self-pay

## 2020-10-28 ENCOUNTER — Other Ambulatory Visit (HOSPITAL_COMMUNITY): Payer: Self-pay

## 2020-10-31 ENCOUNTER — Ambulatory Visit: Payer: 59 | Admitting: Podiatry

## 2020-10-31 ENCOUNTER — Other Ambulatory Visit (HOSPITAL_COMMUNITY): Payer: Self-pay

## 2020-10-31 ENCOUNTER — Ambulatory Visit (INDEPENDENT_AMBULATORY_CARE_PROVIDER_SITE_OTHER): Payer: 59

## 2020-10-31 ENCOUNTER — Other Ambulatory Visit: Payer: Self-pay

## 2020-10-31 DIAGNOSIS — M722 Plantar fascial fibromatosis: Secondary | ICD-10-CM

## 2020-10-31 DIAGNOSIS — M79672 Pain in left foot: Secondary | ICD-10-CM

## 2020-10-31 MED ORDER — MELOXICAM 15 MG PO TABS
15.0000 mg | ORAL_TABLET | Freq: Every day | ORAL | 0 refills | Status: DC
  Filled 2020-10-31: qty 30, 30d supply, fill #0

## 2020-10-31 MED ORDER — TRIAMCINOLONE ACETONIDE 10 MG/ML IJ SUSP
10.0000 mg | Freq: Once | INTRAMUSCULAR | Status: AC
  Administered 2020-10-31: 10 mg

## 2020-10-31 NOTE — Patient Instructions (Signed)

## 2020-11-02 NOTE — Progress Notes (Signed)
Subjective:   Patient ID: [name], female   DOB: 55 y.o.   MRN: [id number]   HPI 55 year old female presents the office today for 6-week history of left foot pain to the bottom of her heel.  She previously had [place] fasciitis on her right foot in 2018 and was treated by Dr. Milinda Pointer for this.  She states that she has gone to purchase new shoes as well as over-the-counter inserts without significant provement.  She has pain in the morning when she first gets up and also after been on her feet all day.  Denies any recent injury or trauma.  Describes a throbbing sensation.  No rating pain or weakness.  No other concerns.   Review of Systems  All other systems reviewed and are negative.  Past Medical History:  Diagnosis Date   DDD (degenerative disc disease), lumbar    S-I joints   Heart murmur    Hyperlipidemia    Superficial granulomatous pyoderma    Ulcerative colitis     Past Surgical History:  Procedure Laterality Date   ABDOMINAL HERNIA REPAIR     age 47   COLONOSCOPY  05/12/2012   multiple    POLYPECTOMY     TONSILLECTOMY       Current Outpatient Medications:    meloxicam (MOBIC) 15 MG tablet, Take 1 tablet (15 mg total) by mouth daily., Disp: 30 tablet, Rfl: 0   Golimumab 100 MG/ML SOAJ, INJECT 1 pen (100 MG) INTO THE SKIN EVERY 21 DAYS., Disp: 1 mL, Rfl: 11   Insulin Pen Needle 32G X 4 MM MISC, USE AS DIRECTED WITH SAXENDA, Disp: 100 each, Rfl: 1   LIALDA 1.2 g EC tablet, TAKE 2 TABLETS BY MOUTH DAILY WITH BREAKFAST, Disp: 180 tablet, Rfl: 3   Liraglutide -Weight Management 18 MG/3ML SOPN, Inject 3 mg into the skin daily., Disp: 15 mL, Rfl: 11   PFIZER-BIONT COVID-19 VAC-TRIS SUSP injection, , Disp: , Rfl:    sertraline (ZOLOFT) 50 MG tablet, TAKE 1 TABLET BY MOUTH DAILY, Disp: 30 tablet, Rfl: 11  Allergies  Allergen Reactions   Doxycycline Rash          Objective:  Physical Exam  General: AAO x3, NAD  Dermatological: Skin is warm, dry and supple  bilateral.  There are no open sores, no preulcerative lesions, no rash or signs of infection present.  Vascular: Dorsalis Pedis artery and Posterior Tibial artery pedal pulses are 2/4 bilateral with immedate capillary fill time.  There is no pain with calf compression, swelling, warmth, erythema.   Neruologic: Grossly intact via light touch bilateral.  Negative Tinel sign.  Musculoskeletal: Tenderness to palpation along the plantar medial tubercle of the calcaneus at the insertion of plantar fascia on the left foot. There is no pain along the course of the plantar fascia within the arch of the foot. Plantar fascia appears to be intact. There is no pain with lateral compression of the calcaneus or pain with vibratory sensation. There is no pain along the course or insertion of the achilles tendon. No other areas of tenderness to bilateral lower extremities.Muscular strength 5/5 in all groups tested bilateral.  Gait: Unassisted, Nonantalgic.       Assessment:   Left heel pain, plan fasciitis     Plan:  -Treatment options discussed including all alternatives, risks, and complications -Etiology of symptoms were discussed -X-rays were obtained and reviewed with the patient.  There is no evidence of acute fracture or stress fracture identified today. -  Steroid injection performed.  See procedure note below -Prescribed mobic. Discussed side effects of the medication and directed to stop if any are to occur and call the office.  -Continue stretching, icing daily.  Discussed shoe modifications and different arch supports. -She has a plantar fascial brace from previous and recommended to use this as well.  Procedure: Injection Tendon/Ligament Discussed alternatives, risks, complications and verbal consent was obtained.  Location: Left plantar fascia at the glabrous junction; medial approach. Skin Prep: Alcohol. Injectate: 0.5cc 0.5% marcaine plain, 0.5 cc 2% lidocaine plain and, 1 cc kenalog  10. Disposition: Patient tolerated procedure well. Injection site dressed with a band-aid.  Post-injection care was discussed and return precautions discussed.   No follow-ups on file.  Trula Slade DPM

## 2020-11-04 ENCOUNTER — Encounter: Payer: Self-pay | Admitting: Internal Medicine

## 2020-11-05 ENCOUNTER — Other Ambulatory Visit (HOSPITAL_COMMUNITY): Payer: Self-pay

## 2020-11-06 ENCOUNTER — Other Ambulatory Visit (HOSPITAL_COMMUNITY): Payer: Self-pay

## 2020-11-06 MED FILL — Golimumab Subcutaneous Soln Auto-injector 100 MG/ML: SUBCUTANEOUS | 28 days supply | Qty: 1 | Fill #4 | Status: AC

## 2020-11-07 ENCOUNTER — Other Ambulatory Visit (HOSPITAL_COMMUNITY): Payer: Self-pay

## 2020-11-17 MED FILL — Sertraline HCl Tab 50 MG: ORAL | 30 days supply | Qty: 30 | Fill #5 | Status: AC

## 2020-11-19 ENCOUNTER — Other Ambulatory Visit (HOSPITAL_COMMUNITY): Payer: Self-pay

## 2020-11-27 ENCOUNTER — Other Ambulatory Visit (HOSPITAL_COMMUNITY): Payer: Self-pay

## 2020-11-27 MED FILL — Golimumab Subcutaneous Soln Auto-injector 100 MG/ML: SUBCUTANEOUS | 28 days supply | Qty: 1 | Fill #5 | Status: AC

## 2020-12-04 ENCOUNTER — Encounter: Payer: Self-pay | Admitting: Internal Medicine

## 2020-12-04 ENCOUNTER — Other Ambulatory Visit: Payer: Self-pay

## 2020-12-04 ENCOUNTER — Other Ambulatory Visit (HOSPITAL_COMMUNITY): Payer: Self-pay

## 2020-12-04 ENCOUNTER — Ambulatory Visit (AMBULATORY_SURGERY_CENTER): Payer: 59 | Admitting: *Deleted

## 2020-12-04 VITALS — Ht 64.0 in | Wt 147.0 lb

## 2020-12-04 DIAGNOSIS — K51919 Ulcerative colitis, unspecified with unspecified complications: Secondary | ICD-10-CM

## 2020-12-04 MED ORDER — NA SULFATE-K SULFATE-MG SULF 17.5-3.13-1.6 GM/177ML PO SOLN
1.0000 | Freq: Once | ORAL | 0 refills | Status: AC
  Filled 2020-12-04: qty 354, 7d supply, fill #0

## 2020-12-04 NOTE — Progress Notes (Signed)
Virtual pre-visit conducted over the telephone. Instructions forwarded through [place].  No egg or soy allergy known to patient  No issues known to pt with past sedation with any surgeries or procedures Patient denies ever being told they had issues or difficulty with intubation  No FH of Malignant Hyperthermia Pt is not on diet pills Pt is not on  home 02  Pt is not on blood thinners  Pt denies issues with constipation  No A fib or A flutter  EMMI video to pt or via [place] 19 guidelines implemented in PV today with Pt and RN   Pt is fully vaccinated  for Covid   Due to the COVID-19 pandemic we are asking patients to follow certain guidelines.  Pt aware of COVID protocols and [facility] guidelines

## 2020-12-06 ENCOUNTER — Other Ambulatory Visit (HOSPITAL_COMMUNITY): Payer: Self-pay

## 2020-12-10 ENCOUNTER — Other Ambulatory Visit (HOSPITAL_COMMUNITY): Payer: Self-pay

## 2020-12-10 ENCOUNTER — Other Ambulatory Visit: Payer: Self-pay | Admitting: Internal Medicine

## 2020-12-10 ENCOUNTER — Ambulatory Visit: Payer: 59 | Admitting: Podiatry

## 2020-12-10 ENCOUNTER — Other Ambulatory Visit: Payer: Self-pay

## 2020-12-10 DIAGNOSIS — M722 Plantar fascial fibromatosis: Secondary | ICD-10-CM

## 2020-12-10 MED ORDER — LIALDA 1.2 G PO TBEC
2.4000 g | DELAYED_RELEASE_TABLET | Freq: Every day | ORAL | 0 refills | Status: DC
  Filled 2020-12-10: qty 180, 90d supply, fill #0

## 2020-12-10 NOTE — Patient Instructions (Signed)
While at your visit today you received a steroid injection in your foot or ankle to help with your pain. Along with having the steroid medication there is some "numbing" medication in the shot that you received. Due to this you may notice some numbness to the area for the next couple of hours.    The actually benefit from the steroid injection may take up to 2-7 days to see a difference. You may actually experience a small (as in 10%) INCREASE in pain in the first 24 hours---that is common. It would be best if you can ice the area today and take anti-inflammatory medications (such as Ibuprofen, Motrin, or Aleve) if you are able to take these medications. If you were prescribed another medication to help with the pain go ahead and start that medication today    Things to watch out for that you should contact us or a health care provider urgently would include: 1. Unusual (as in more than 10%) increase in pain 2. New fever > 101.5 3. New swelling or redness of the injected area.  4. Streaking of red lines around the area injected.  If you have any questions or concerns about this, please give our office a call at [phone].

## 2020-12-13 DIAGNOSIS — M722 Plantar fascial fibromatosis: Secondary | ICD-10-CM | POA: Insufficient documentation

## 2020-12-13 NOTE — Progress Notes (Signed)
Subjective: 55 year old female presents the office today for follow evaluation of left foot pain, plan fasciitis.  Patient that she has seen improvement however she still having discomfort.  Symptoms seem to get worse after being on her feet.  No injury or changes otherwise.  Objective: AAO x3, NAD DP/PT pulses palpable bilaterally, CRT less than 3 seconds There is improvement still continued tenderness palpation on the plantar medial tubercle of the calcaneus at the insertion of the plantar fascia.  The plantar fascia appears to be intact.  There is no pain with lateral compression of calcaneus.  No edema, erythema.  No Tinel sign.  MMT 5/5.  No pain with calf compression, swelling, warmth, erythema  Assessment: Left heel pain, plantar fasciitis  Plan: -All treatment options discussed with the patient including all alternatives, risks, complications.  -Second steroid injection performed.  See procedure note below.  She is in a come back to get measured for orthotics.  Continue stretching, icing daily. -Patient encouraged to call the office with any questions, concerns, change in symptoms.   Procedure: Injection Tendon/Ligament Discussed alternatives, risks, complications and verbal consent was obtained.  Location: Left plantar fascia at the glabrous junction; medial approach. Skin Prep: Alcohol. Injectate: 0.5cc 0.5% marcaine plain, 0.5 cc 2% lidocaine plain and, 1 cc kenalog 10. Disposition: Patient tolerated procedure well. Injection site dressed with a band-aid.  Post-injection care was discussed and return precautions discussed.   Return for for orthotics.  Trula Slade DPM

## 2020-12-16 ENCOUNTER — Other Ambulatory Visit: Payer: Self-pay | Admitting: Obstetrics and Gynecology

## 2020-12-16 DIAGNOSIS — Z1231 Encounter for screening mammogram for malignant neoplasm of breast: Secondary | ICD-10-CM

## 2020-12-18 ENCOUNTER — Other Ambulatory Visit: Payer: Self-pay

## 2020-12-18 ENCOUNTER — Encounter: Payer: Self-pay | Admitting: Internal Medicine

## 2020-12-18 ENCOUNTER — Ambulatory Visit (AMBULATORY_SURGERY_CENTER): Payer: 59 | Admitting: Internal Medicine

## 2020-12-18 VITALS — BP 119/73 | HR 62 | Temp 98.0°F | Resp 12 | Ht 64.0 in | Wt 147.0 lb

## 2020-12-18 DIAGNOSIS — K51919 Ulcerative colitis, unspecified with unspecified complications: Secondary | ICD-10-CM

## 2020-12-18 DIAGNOSIS — Z0389 Encounter for observation for other suspected diseases and conditions ruled out: Secondary | ICD-10-CM

## 2020-12-18 DIAGNOSIS — K648 Other hemorrhoids: Secondary | ICD-10-CM

## 2020-12-18 MED ORDER — SODIUM CHLORIDE 0.9 % IV SOLN: 500.0000 mL | Freq: Once | INTRAVENOUS | Status: DC

## 2020-12-18 MED FILL — Sertraline HCl Tab 50 MG: ORAL | 30 days supply | Qty: 30 | Fill #6 | Status: AC

## 2020-12-18 NOTE — Patient Instructions (Signed)
Please read handouts provided. Continue present medications. Await pathology results.   YOU HAD AN ENDOSCOPIC PROCEDURE TODAY AT [facility]:   Refer to the procedure report that was given to you for any specific questions about what was found during the examination.  If the procedure report does not answer your questions, please call your gastroenterologist to clarify.  If you requested that your care partner not be given the details of your procedure findings, then the procedure report has been included in a sealed envelope for you to review at your convenience later.  YOU SHOULD EXPECT: Some feelings of bloating in the abdomen. Passage of more gas than usual.  Walking can help get rid of the air that was put into your GI tract during the procedure and reduce the bloating. If you had a lower endoscopy (such as a colonoscopy or flexible sigmoidoscopy) you may notice spotting of blood in your stool or on the toilet paper. If you underwent a bowel prep for your procedure, you may not have a normal bowel movement for a few days.  Please Note:  You might notice some irritation and congestion in your nose or some drainage.  This is from the oxygen used during your procedure.  There is no need for concern and it should clear up in a day or so.  SYMPTOMS TO REPORT IMMEDIATELY:  Following lower endoscopy (colonoscopy or flexible sigmoidoscopy):  Excessive amounts of blood in the stool  Significant tenderness or worsening of abdominal pains  Swelling of the abdomen that is new, acute  Fever of 100F or higher   For urgent or emergent issues, a gastroenterologist can be reached at any hour by calling [phone]. Do not use MyChart messaging for urgent concerns.    DIET:  We do recommend a small meal at first, but then you may proceed to your regular diet.  Drink plenty of fluids but you should avoid alcoholic beverages for 24 hours.  ACTIVITY:  You should plan to take it easy  for the rest of today and you should NOT DRIVE or use heavy machinery until tomorrow (because of the sedation medicines used during the test).    FOLLOW UP: Our staff will call the number listed on your records 48-72 hours following your procedure to check on you and address any questions or concerns that you may have regarding the information given to you following your procedure. If we do not reach you, we will leave a message.  We will attempt to reach you two times.  During this call, we will ask if you have developed any symptoms of COVID 19. If you develop any symptoms (ie: fever, flu-like symptoms, shortness of breath, cough etc.) before then, please call [phone].  If you test positive for Covid 19 in the 2 weeks post procedure, please call and report this information to [place].    If any biopsies were taken you will be contacted by phone or by letter within the next 1-3 weeks.  Please call us at [phone] if you have not heard about the biopsies in 3 weeks.    SIGNATURES/CONFIDENTIALITY: You and/or your care partner have signed paperwork which will be entered into your electronic medical record.  These signatures attest to the fact that that the information above on your After Visit Summary has been reviewed and is understood.  Full responsibility of the confidentiality of this discharge information lies with you and/or your care-partner.

## 2020-12-18 NOTE — Progress Notes (Signed)
Pt's states no medical or surgical changes since previsit or office visit.   VS by [place].

## 2020-12-18 NOTE — Progress Notes (Signed)
Called to room to assist during endoscopic procedure.  Patient ID and intended procedure confirmed with present staff. Received instructions for my participation in the procedure from the performing physician.  

## 2020-12-18 NOTE — Progress Notes (Signed)
GASTROENTEROLOGY PROCEDURE H&P NOTE   Primary Care Physician: Orma Render, NP    Reason for Procedure:  Elevated risk colon cancer screening in a patient with chronic left-sided ulcerative colitis of greater than 15 years duration  Plan:    Colonoscopy  Patient is appropriate for endoscopic procedure(s) in the ambulatory ([place]) setting.  The nature of the procedure, as well as the risks, benefits, and alternatives were carefully and thoroughly reviewed with the patient. Ample time for discussion and questions allowed. The patient understood, was satisfied, and agreed to proceed.     HPI: [name] is a 55 y.o. female who presents for screening colonoscopy.  Medical history as below.  She is been doing well.  No issues.  No chest pain or shortness of breath today.  Past Medical History:  Diagnosis Date   DDD (degenerative disc disease), lumbar    S-I joints   Heart murmur    Hyperlipidemia    Superficial granulomatous pyoderma    Ulcerative colitis     Past Surgical History:  Procedure Laterality Date   ABDOMINAL HERNIA REPAIR     age 33   COLONOSCOPY  05/12/2012   multiple    POLYPECTOMY     TONSILLECTOMY      Prior to Admission medications   Medication Sig Start Date End Date Taking? Authorizing Provider  LIALDA 1.2 g EC tablet TAKE 2 TABLETS BY MOUTH DAILY WITH BREAKFAST 12/10/20 12/10/21 Yes Jaidah Lomax, Lajuan Lines, MD  sertraline (ZOLOFT) 50 MG tablet TAKE 1 TABLET BY MOUTH DAILY 02/29/20 02/28/21 Yes Cousins, Sheronette, MD  Golimumab 100 MG/ML SOAJ INJECT 1 pen (100 MG) INTO THE SKIN EVERY 21 DAYS. 01/29/20 01/28/21  Tresa Garter, MD  Insulin Pen Needle 32G X 4 MM MISC USE AS DIRECTED WITH SAXENDA Patient not taking: Reported on 12/18/2020 07/01/20 07/01/21  Orma Render, NP  Liraglutide -Weight Management 18 MG/3ML SOPN Inject 3 mg into the skin daily. Patient not taking: Reported on 12/18/2020 07/01/20 07/01/21  Orma Render, NP    Current Outpatient  Medications  Medication Sig Dispense Refill   LIALDA 1.2 g EC tablet TAKE 2 TABLETS BY MOUTH DAILY WITH BREAKFAST 180 tablet 0   sertraline (ZOLOFT) 50 MG tablet TAKE 1 TABLET BY MOUTH DAILY 30 tablet 11   Golimumab 100 MG/ML SOAJ INJECT 1 pen (100 MG) INTO THE SKIN EVERY 21 DAYS. 1 mL 11   Insulin Pen Needle 32G X 4 MM MISC USE AS DIRECTED WITH SAXENDA (Patient not taking: Reported on 12/18/2020) 100 each 1   Liraglutide -Weight Management 18 MG/3ML SOPN Inject 3 mg into the skin daily. (Patient not taking: Reported on 12/18/2020) 15 mL 11   Current Facility-Administered Medications  Medication Dose Route Frequency Provider Last Rate Last Admin   0.9 %  sodium chloride infusion  500 mL Intravenous Once Tajae Rybicki, Lajuan Lines, MD        Allergies as of 12/18/2020 - Review Complete 12/18/2020  Allergen Reaction Noted   Doxycycline Rash 03/31/2016    Family History  Problem Relation Age of Onset   Macular degeneration Father    Lung cancer Father 67   Breast cancer Mother    Hyperlipidemia Mother    Glaucoma Mother    Colon cancer Neg Hx    Colon polyps Neg Hx    Esophageal cancer Neg Hx    Rectal cancer Neg Hx    Stomach cancer Neg Hx     Social History  Socioeconomic History   Marital status: Single    Spouse name: Not on file   Number of children: 2   Years of education: Not on file   Highest education level: Not on file  Occupational History   Occupation: Nurse, [employer]   Tobacco Use   Smoking status: Never   Smokeless tobacco: Never  Vaping Use   Vaping Use: Never used  Substance and Sexual Activity   Alcohol use: Yes    Comment: rare   Drug use: No   Sexual activity: Not on file  Other Topics Concern   Not on file  Social History Narrative   Not on file   Social Determinants of Health   Financial Resource Strain: Not on file  Food Insecurity: Not on file  Transportation Needs: Not on file  Physical Activity: Not on file  Stress: Not on file  Social  Connections: Not on file  Intimate Partner Violence: Not on file    Physical Exam: Vital signs in last 24 hours: @BP  114/72   Pulse 72   Temp 98 F (36.7 C)   Resp 12   Ht 5' 4"  (1.626 m)   Wt 147 lb (66.7 kg)   SpO2 100%   BMI 25.23 kg/m  GEN: NAD EYE: Sclerae anicteric ENT: MMM CV: Non-tachycardic Pulm: CTA b/l GI: Soft, NT/ND NEURO:  Alert & Oriented x 3   Zenovia Jarred, MD [facility]  12/18/2020 1:40 PM

## 2020-12-18 NOTE — Op Note (Signed)
[facility] Patient Name: [name] Procedure Date: 12/18/2020 12:24 PM MRN: [id number] Endoscopist: Jerene Bears , MD Age: 55 Referring MD:  Date of Birth: [birth year] Gender: Female Account #: [id number] Procedure:                Colonoscopy Indications:              High risk colon cancer surveillance: Ulcerative                            left sided colitis of 8 (or more) years duration,                            Last colonoscopy: September 2019 Medicines:                Monitored Anesthesia Care Procedure:                Pre-Anesthesia Assessment:                           - Prior to the procedure, a History and Physical                            was performed, and patient medications and                            allergies were reviewed. The patient's tolerance of                            previous anesthesia was also reviewed. The risks                            and benefits of the procedure and the sedation                            options and risks were discussed with the patient.                            All questions were answered, and informed consent                            was obtained. Prior Anticoagulants: The patient has                            taken no previous anticoagulant or antiplatelet                            agents. ASA Grade Assessment: II - A patient with                            mild systemic disease. After reviewing the risks                            and benefits, the patient was deemed in  satisfactory condition to undergo the procedure.                           After obtaining informed consent, the colonoscope                            was passed under direct vision. Throughout the                            procedure, the patient's blood pressure, pulse, and                            oxygen saturations were monitored continuously. The                            Olympus PCF-H190DL ([id number])  Colonoscope was                            introduced through the anus and advanced to the                            cecum, identified by appendiceal orifice and                            ileocecal valve. The colonoscopy was performed                            without difficulty. The patient tolerated the                            procedure well. The quality of the bowel                            preparation was fair. The ileocecal valve,                            appendiceal orifice, and rectum were photographed. Scope In: 1:43:40 PM Scope Out: 2:04:05 PM Scope Withdrawal Time: 0 hours 14 minutes 11 seconds  Total Procedure Duration: 0 hours 20 minutes 25 seconds  Findings:                 The digital rectal exam was normal.                           Inflammation was not found based on the endoscopic                            appearance of the mucosa in the colon. This was                            graded as Mayo Score 0 (normal or inactive                            disease), and when compared to the previous  examination, the findings are unchanged. Four                            biopsies were taken every 10 cm with a cold forceps                            from the entire colon for dysplasia surveillance                            and ulcerative colitis surveillance. These biopsy                            specimens from the right colon and left colon were                            sent to Pathology.                           Internal hemorrhoids were found during                            retroflexion. The hemorrhoids were small. Complications:            No immediate complications. Estimated Blood Loss:     Estimated blood loss was minimal. Impression:               - Preparation of the colon was fair.                           - Inactive (Mayo Score 0) ulcerative colitis, in                            remission, unchanged since the last  examination.                            Biopsied.                           - Small internal hemorrhoids. Recommendation:           - Patient has a contact number available for                            emergencies. The signs and symptoms of potential                            delayed complications were discussed with the                            patient. Return to normal activities tomorrow.                            Written discharge instructions were provided to the                            patient.                           -  Resume previous diet.                           - Continue present medications.                           - Await pathology results.                           - Repeat colonoscopy is recommended for                            surveillance. The colonoscopy date will be                            determined after pathology results from today's                            exam become available for review. Jerene Bears, MD 12/18/2020 2:15:47 PM This report has been signed electronically.

## 2020-12-18 NOTE — Progress Notes (Signed)
To PACU, VSS. Report to [email or web address] 

## 2020-12-19 ENCOUNTER — Other Ambulatory Visit (HOSPITAL_COMMUNITY): Payer: Self-pay

## 2020-12-20 ENCOUNTER — Telehealth: Payer: Self-pay | Admitting: *Deleted

## 2020-12-20 NOTE — Telephone Encounter (Signed)
  Follow up Call-  Call back number 12/18/2020  Post procedure Call Back phone  # [phone]  Permission to leave phone message Yes  Some recent data might be hidden     Patient questions:  Do you have a fever, pain , or abdominal swelling? No. Pain Score  0 *  Have you tolerated food without any problems? Yes.    Have you been able to return to your normal activities? Yes.    Do you have any questions about your discharge instructions: Diet   No. Medications  No. Follow up visit  No.  Do you have questions or concerns about your Care? No.  Actions: * If pain score is 4 or above: No action needed, pain <4.  Have you developed a fever since your procedure? no  2.   Have you had an respiratory symptoms (SOB or cough) since your procedure? no  3.   Have you tested positive for COVID 19 since your procedure no  4.   Have you had any family members/close contacts diagnosed with the COVID 19 since your procedure?  no   If yes to any of these questions please route to Joylene John, RN and Joella Prince, RN

## 2020-12-23 ENCOUNTER — Encounter: Payer: Self-pay | Admitting: Internal Medicine

## 2020-12-26 ENCOUNTER — Other Ambulatory Visit (HOSPITAL_COMMUNITY): Payer: Self-pay

## 2020-12-30 ENCOUNTER — Ambulatory Visit (HOSPITAL_BASED_OUTPATIENT_CLINIC_OR_DEPARTMENT_OTHER): Payer: 59 | Admitting: Nurse Practitioner

## 2020-12-31 ENCOUNTER — Ambulatory Visit (HOSPITAL_BASED_OUTPATIENT_CLINIC_OR_DEPARTMENT_OTHER): Payer: 59 | Admitting: Nurse Practitioner

## 2021-01-02 ENCOUNTER — Other Ambulatory Visit (HOSPITAL_COMMUNITY): Payer: Self-pay

## 2021-01-02 MED FILL — Golimumab Subcutaneous Soln Auto-injector 100 MG/ML: SUBCUTANEOUS | 28 days supply | Qty: 1 | Fill #6 | Status: AC

## 2021-01-02 NOTE — Progress Notes (Signed)
Established Patient Office Visit  Subjective:  Patient ID: [name], female    DOB: Dec 10, 1965  Age: 55 y.o. MRN: [id number]  CC:  Chief Complaint  Patient presents with   Follow-up    Patient wants to discuss weight loss and cholesterol.   Dr. Garwin Brothers in December  HPI [name] presents for follow-up for weight and lipids.   Weight Last seen 07/01/2020 Concerns with increased weight despite diet.  Had started liraglutide in January 22. Successful with 16 lb weight loss in 4 months Hx of HLD- wanting to avoid statins and hoping weight loss will help lower her cholesterol levels.  BP is stable today  Past Medical History:  Diagnosis Date   DDD (degenerative disc disease), lumbar    S-I joints   Heart murmur    Hyperlipidemia    Superficial granulomatous pyoderma    Ulcerative colitis     Past Surgical History:  Procedure Laterality Date   ABDOMINAL HERNIA REPAIR     age 71   COLONOSCOPY  05/12/2012   multiple    POLYPECTOMY     TONSILLECTOMY      Family History  Problem Relation Age of Onset   Macular degeneration Father    Lung cancer Father 26   Breast cancer Mother    Hyperlipidemia Mother    Glaucoma Mother    Colon cancer Neg Hx    Colon polyps Neg Hx    Esophageal cancer Neg Hx    Rectal cancer Neg Hx    Stomach cancer Neg Hx     Social History   Socioeconomic History   Marital status: Single    Spouse name: Not on file   Number of children: 2   Years of education: Not on file   Highest education level: Not on file  Occupational History   Occupation: Marine scientist, [employer]   Tobacco Use   Smoking status: Never   Smokeless tobacco: Never  Vaping Use   Vaping Use: Never used  Substance and Sexual Activity   Alcohol use: Yes    Comment: rare   Drug use: No   Sexual activity: Not on file  Other Topics Concern   Not on file  Social History Narrative   Not on file   Social Determinants of Health   Financial Resource Strain:  Not on file  Food Insecurity: Not on file  Transportation Needs: Not on file  Physical Activity: Not on file  Stress: Not on file  Social Connections: Not on file  Intimate Partner Violence: Not on file    Outpatient Medications Prior to Visit  Medication Sig Dispense Refill   Golimumab 100 MG/ML SOAJ INJECT 1 pen (100 MG) INTO THE SKIN EVERY 21 DAYS. 1 mL 11   Insulin Pen Needle 32G X 4 MM MISC USE AS DIRECTED WITH SAXENDA 100 each 1   LIALDA 1.2 g EC tablet TAKE 2 TABLETS BY MOUTH DAILY WITH BREAKFAST 180 tablet 0   Liraglutide -Weight Management 18 MG/3ML SOPN Inject 3 mg into the skin daily. 15 mL 11   sertraline (ZOLOFT) 50 MG tablet TAKE 1 TABLET BY MOUTH DAILY 30 tablet 11   No facility-administered medications prior to visit.    Allergies  Allergen Reactions   Doxycycline Rash    ROS Review of Systems All review of systems negative except what is listed in the HPI    Objective:    Physical Exam Vitals and nursing note reviewed.  Constitutional:  General: She is not in acute distress.    Appearance: Normal appearance.  HENT:     Head: Normocephalic and atraumatic.  Eyes:     General: Lids are normal. Vision grossly intact. No scleral icterus.    Extraocular Movements: Extraocular movements intact.     Conjunctiva/sclera: Conjunctivae normal.     Pupils: Pupils are equal, round, and reactive to light.  Neck:     Vascular: No carotid bruit.  Cardiovascular:     Rate and Rhythm: Normal rate and regular rhythm.     Pulses: Normal pulses.     Heart sounds: Normal heart sounds. No murmur heard. Pulmonary:     Effort: Pulmonary effort is normal. No respiratory distress.     Breath sounds: Normal breath sounds.  Abdominal:     General: Bowel sounds are normal.     Palpations: Abdomen is soft.  Musculoskeletal:        General: Normal range of motion.     Cervical back: Normal range of motion.  Skin:    General: Skin is warm and dry.     Capillary Refill:  Capillary refill takes less than 2 seconds.  Neurological:     General: No focal deficit present.     Mental Status: She is alert and oriented to person, place, and time.     Motor: Motor function is intact.     Coordination: Coordination is intact.     Gait: Gait is intact.  Psychiatric:        Attention and Perception: Attention normal.        Mood and Affect: Mood and affect normal.        Speech: Speech normal.        Behavior: Behavior normal. Behavior is cooperative.        Thought Content: Thought content normal.        Cognition and Memory: Cognition and memory normal.        Judgment: Judgment normal.    BP 103/65   Pulse 68   Ht 5' 4"  (1.626 m)   Wt 149 lb 6.4 oz (67.8 kg)   SpO2 100%   BMI 25.64 kg/m  Wt Readings from Last 3 Encounters:  01/03/21 149 lb 6.4 oz (67.8 kg)  12/18/20 147 lb (66.7 kg)  12/04/20 147 lb (66.7 kg)     Health Maintenance Due  Topic Date Due   Pneumococcal Vaccine 57-92 Years old (2 - PPSV23 if available, else PCV20) 12/06/2019    There are no preventive care reminders to display for this patient.  Lab Results  Component Value Date   TSH 1.09 08/04/2019   Lab Results  Component Value Date   WBC 4.9 08/04/2019   HGB 14.6 08/04/2019   HCT 44.1 08/04/2019   MCV 90.2 08/04/2019   PLT 279 08/04/2019   Lab Results  Component Value Date   NA 138 03/22/2020   K 4.4 03/22/2020   CO2 28 03/22/2020   GLUCOSE 91 03/22/2020   BUN 12 03/22/2020   CREATININE 0.62 03/22/2020   BILITOT 0.4 03/22/2020   ALKPHOS 75 12/12/2018   AST 18 03/22/2020   ALT 13 03/22/2020   PROT 7.6 03/22/2020   ALBUMIN 4.7 12/12/2018   CALCIUM 9.5 03/22/2020   GFR 95.91 12/12/2018   Lab Results  Component Value Date   CHOL 246 (H) 03/22/2020   Lab Results  Component Value Date   HDL 53 03/22/2020   Lab Results  Component Value Date  LDLCALC 166 (H) 03/22/2020   Lab Results  Component Value Date   TRIG 134 03/22/2020   Lab Results   Component Value Date   CHOLHDL 4.6 03/22/2020   Lab Results  Component Value Date   HGBA1C 5.3 03/22/2020      Assessment & Plan:   Problem List Items Addressed This Visit     Overweight (BMI 25.0-29.9) - Primary    Doing well with saxenda injections and losing weight BMI now down to 25.64!! No HTN present.  She is tolerating the medication well We will continue to work on diet and exercise and continue saxenda for management and maintenance.  You are doing FANTASTIC! Will get labs today.       Relevant Orders   CBC With Differential   Comprehensive metabolic panel   Lipid panel   TSH   FSH/LH   Mixed hyperlipidemia    Labs today Likely decreased with recent diet and activity changes and weight loss  No changes at this time      Relevant Orders   Lipid panel   Other Visit Diagnoses     Screening for deficiency anemia       Relevant Orders   CBC With Differential   Screening for thyroid disorder       Relevant Orders   TSH   Screening for endocrine, nutritional, metabolic and immunity disorder       Relevant Orders   CBC With Differential   Comprehensive metabolic panel   Lipid panel   TSH   Menstrual bleeding problem       Relevant Orders   FSH/LH   High risk medication use       Relevant Orders   QuantiFERON-TB Gold Plus       No orders of the defined types were placed in this encounter.   Follow-up: Return in about 1 year (around 01/03/2022) for CPE with labs.    Orma Render, NP

## 2021-01-03 ENCOUNTER — Encounter (HOSPITAL_BASED_OUTPATIENT_CLINIC_OR_DEPARTMENT_OTHER): Payer: Self-pay | Admitting: Nurse Practitioner

## 2021-01-03 ENCOUNTER — Ambulatory Visit (HOSPITAL_BASED_OUTPATIENT_CLINIC_OR_DEPARTMENT_OTHER): Payer: 59 | Admitting: Nurse Practitioner

## 2021-01-03 ENCOUNTER — Other Ambulatory Visit: Payer: Self-pay

## 2021-01-03 VITALS — BP 103/65 | HR 68 | Ht 64.0 in | Wt 149.4 lb

## 2021-01-03 DIAGNOSIS — Z13228 Encounter for screening for other metabolic disorders: Secondary | ICD-10-CM

## 2021-01-03 DIAGNOSIS — Z1329 Encounter for screening for other suspected endocrine disorder: Secondary | ICD-10-CM | POA: Diagnosis not present

## 2021-01-03 DIAGNOSIS — E663 Overweight: Secondary | ICD-10-CM

## 2021-01-03 DIAGNOSIS — E782 Mixed hyperlipidemia: Secondary | ICD-10-CM | POA: Diagnosis not present

## 2021-01-03 DIAGNOSIS — Z130 Encounter for screening for diseases of the blood and blood-forming organs and certain disorders involving the immune mechanism: Secondary | ICD-10-CM | POA: Diagnosis not present

## 2021-01-03 DIAGNOSIS — Z1321 Encounter for screening for nutritional disorder: Secondary | ICD-10-CM

## 2021-01-03 DIAGNOSIS — Z79899 Other long term (current) drug therapy: Secondary | ICD-10-CM

## 2021-01-03 DIAGNOSIS — N939 Abnormal uterine and vaginal bleeding, unspecified: Secondary | ICD-10-CM

## 2021-01-03 DIAGNOSIS — N926 Irregular menstruation, unspecified: Secondary | ICD-10-CM

## 2021-01-03 NOTE — Patient Instructions (Signed)
You are doing fantastic!!! Keep up the great work!!!  I will let you know if we need to make any changes based on your labs.

## 2021-01-06 ENCOUNTER — Other Ambulatory Visit (HOSPITAL_COMMUNITY): Payer: Self-pay

## 2021-01-06 ENCOUNTER — Ambulatory Visit: Payer: 59 | Attending: Family Medicine | Admitting: Pharmacist

## 2021-01-06 DIAGNOSIS — Z79899 Other long term (current) drug therapy: Secondary | ICD-10-CM

## 2021-01-06 NOTE — Progress Notes (Signed)
   S: Patient presents for review of their specialty medication therapy.  Patient is currently taking Simponi for ulcerative colitis. Patient is managed by Dr. Hilarie Fredrickson for this.   Adherence: confirms   Efficacy: reports that she is doing well. Had her colonoscopy ~2-3 weeks ago and findings were great!  Denies any adverse effects.  Dosing:  Ulcerative colitis: 100 mg every 4 weeks   Screening: TB test: negative 08/2017 Hepatitis: negative 04/2015  Monitoring: S/sx of infection: denies CBC: see below S/sx of hypersensitivity: denies S/sx of malignancy: denies  S/sx of heart failure: denies  S/sx of autoimmune disorder: denies   O:  Lab Results  Component Value Date   WBC 4.9 08/04/2019   HGB 14.6 08/04/2019   HCT 44.1 08/04/2019   MCV 90.2 08/04/2019   PLT 279 08/04/2019      Chemistry      Component Value Date/Time   NA 138 03/22/2020 1040   K 4.4 03/22/2020 1040   CL 103 03/22/2020 1040   CO2 28 03/22/2020 1040   BUN 12 03/22/2020 1040   CREATININE 0.62 03/22/2020 1040      Component Value Date/Time   CALCIUM 9.5 03/22/2020 1040   ALKPHOS 75 12/12/2018 1235   AST 18 03/22/2020 1040   ALT 13 03/22/2020 1040   BILITOT 0.4 03/22/2020 1040      A/P: 1. Medication review: Patient currently on Simponi for ulcerative colitis. Reviewed the medication with the patient, including the following: Simponi, golimumab, is a TNF? blocker.  Patient educated on purpose, proper use and potential adverse effects of Simponi. There is an increased risk of infection and malignancy with this medication. Do not give patients live vaccinations while they are on this medication. No recommendations for changes.    Benard Halsted, PharmD, [facility], [place] [phone]

## 2021-01-09 ENCOUNTER — Other Ambulatory Visit (HOSPITAL_COMMUNITY): Payer: Self-pay

## 2021-01-09 ENCOUNTER — Encounter (HOSPITAL_BASED_OUTPATIENT_CLINIC_OR_DEPARTMENT_OTHER): Payer: Self-pay | Admitting: Nurse Practitioner

## 2021-01-09 NOTE — Assessment & Plan Note (Signed)
Doing well with saxenda injections and losing weight BMI now down to 25.64!! No HTN present.  She is tolerating the medication well We will continue to work on diet and exercise and continue saxenda for management and maintenance.  You are doing FANTASTIC! Will get labs today.

## 2021-01-09 NOTE — Assessment & Plan Note (Signed)
Labs today Likely decreased with recent diet and activity changes and weight loss  No changes at this time

## 2021-01-11 LAB — CBC WITH DIFFERENTIAL
Basophils Absolute: 0.1 10*3/uL (ref 0.0–0.2)
Basos: 1 %
EOS (ABSOLUTE): 0.1 10*3/uL (ref 0.0–0.4)
Eos: 2 %
Hematocrit: 41.3 % (ref 34.0–46.6)
Hemoglobin: 14.1 g/dL (ref 11.1–15.9)
Immature Grans (Abs): 0 10*3/uL (ref 0.0–0.1)
Immature Granulocytes: 0 %
Lymphocytes Absolute: 2.3 10*3/uL (ref 0.7–3.1)
Lymphs: 36 %
MCH: 30.1 pg (ref 26.6–33.0)
MCHC: 34.1 g/dL (ref 31.5–35.7)
MCV: 88 fL (ref 79–97)
Monocytes Absolute: 0.5 10*3/uL (ref 0.1–0.9)
Monocytes: 8 %
Neutrophils Absolute: 3.4 10*3/uL (ref 1.4–7.0)
Neutrophils: 53 %
RBC: 4.69 x10E6/uL (ref 3.77–5.28)
RDW: 11.2 % — ABNORMAL LOW (ref 11.7–15.4)
WBC: 6.4 10*3/uL (ref 3.4–10.8)

## 2021-01-11 LAB — COMPREHENSIVE METABOLIC PANEL
ALT: 12 IU/L (ref 0–32)
AST: 16 IU/L (ref 0–40)
Albumin/Globulin Ratio: 1.9 (ref 1.2–2.2)
Albumin: 4.9 g/dL (ref 3.8–4.9)
Alkaline Phosphatase: 86 IU/L (ref 44–121)
BUN/Creatinine Ratio: 15 (ref 9–23)
BUN: 11 mg/dL (ref 6–24)
Bilirubin Total: 0.5 mg/dL (ref 0.0–1.2)
CO2: 22 mmol/L (ref 20–29)
Calcium: 9.2 mg/dL (ref 8.7–10.2)
Chloride: 101 mmol/L (ref 96–106)
Creatinine, Ser: 0.74 mg/dL (ref 0.57–1.00)
Globulin, Total: 2.6 g/dL (ref 1.5–4.5)
Glucose: 80 mg/dL (ref 70–99)
Potassium: 4.2 mmol/L (ref 3.5–5.2)
Sodium: 138 mmol/L (ref 134–144)
Total Protein: 7.5 g/dL (ref 6.0–8.5)
eGFR: 97 mL/min/{1.73_m2} (ref >59)

## 2021-01-11 LAB — LIPID PANEL
Chol/HDL Ratio: 4.3 ratio (ref 0.0–4.4)
Cholesterol, Total: 232 mg/dL — ABNORMAL HIGH (ref 100–199)
HDL: 54 mg/dL (ref >39)
LDL Chol Calc (NIH): 160 mg/dL — ABNORMAL HIGH (ref 0–99)
Triglycerides: 102 mg/dL (ref 0–149)
VLDL Cholesterol Cal: 18 mg/dL (ref 5–40)

## 2021-01-11 LAB — TSH: TSH: 1.05 u[IU]/mL (ref 0.450–4.500)

## 2021-01-11 LAB — FSH/LH
FSH: 45.7 m[IU]/mL
LH: 25.6 m[IU]/mL

## 2021-01-11 LAB — QUANTIFERON-TB GOLD PLUS
QuantiFERON Mitogen Value: >10 IU/mL
QuantiFERON Nil Value: 0.04 IU/mL
QuantiFERON TB1 Ag Value: 0.04 IU/mL
QuantiFERON TB2 Ag Value: 0.04 IU/mL
QuantiFERON-TB Gold Plus: NEGATIVE

## 2021-01-19 MED FILL — Sertraline HCl Tab 50 MG: ORAL | 30 days supply | Qty: 30 | Fill #7 | Status: AC

## 2021-01-20 ENCOUNTER — Other Ambulatory Visit (HOSPITAL_COMMUNITY): Payer: Self-pay

## 2021-02-03 ENCOUNTER — Other Ambulatory Visit: Payer: Self-pay | Admitting: Internal Medicine

## 2021-02-03 ENCOUNTER — Other Ambulatory Visit (HOSPITAL_COMMUNITY): Payer: Self-pay

## 2021-02-03 ENCOUNTER — Other Ambulatory Visit: Payer: Self-pay | Admitting: Pharmacist

## 2021-02-03 MED ORDER — SIMPONI 100 MG/ML ~~LOC~~ SOAJ
SUBCUTANEOUS | 11 refills | Status: DC
  Filled 2021-02-03: qty 1, 21d supply, fill #0
  Filled 2021-03-13: qty 1, 21d supply, fill #1
  Filled 2021-04-04: qty 1, 21d supply, fill #2
  Filled 2021-05-14: qty 1, 21d supply, fill #3
  Filled 2021-06-02: qty 1, 21d supply, fill #4
  Filled 2021-07-01: qty 1, 21d supply, fill #5
  Filled 2021-08-04: qty 1, 21d supply, fill #6
  Filled 2021-09-01: qty 1, 21d supply, fill #7
  Filled 2021-10-08: qty 1, 21d supply, fill #8
  Filled 2021-10-28: qty 1, 21d supply, fill #9

## 2021-02-03 MED FILL — Golimumab Subcutaneous Soln Auto-injector 100 MG/ML: SUBCUTANEOUS | Qty: 1 | Fill #0 | Status: CN

## 2021-02-05 ENCOUNTER — Ambulatory Visit
Admission: RE | Admit: 2021-02-05 | Discharge: 2021-02-05 | Disposition: A | Discharge status: Home / Self Care | Payer: 59 | Source: Ambulatory Visit | Attending: Obstetrics and Gynecology | Admitting: Obstetrics and Gynecology

## 2021-02-05 DIAGNOSIS — Z1231 Encounter for screening mammogram for malignant neoplasm of breast: Secondary | ICD-10-CM | POA: Diagnosis not present

## 2021-02-12 ENCOUNTER — Other Ambulatory Visit (HOSPITAL_COMMUNITY): Payer: Self-pay

## 2021-02-17 MED FILL — Sertraline HCl Tab 50 MG: ORAL | 30 days supply | Qty: 30 | Fill #8 | Status: AC

## 2021-02-18 ENCOUNTER — Other Ambulatory Visit (HOSPITAL_COMMUNITY): Payer: Self-pay

## 2021-02-28 ENCOUNTER — Other Ambulatory Visit (HOSPITAL_COMMUNITY): Payer: Self-pay

## 2021-03-03 DIAGNOSIS — Z01419 Encounter for gynecological examination (general) (routine) without abnormal findings: Secondary | ICD-10-CM | POA: Diagnosis not present

## 2021-03-03 DIAGNOSIS — N943 Premenstrual tension syndrome: Secondary | ICD-10-CM | POA: Diagnosis not present

## 2021-03-03 DIAGNOSIS — R87610 Atypical squamous cells of undetermined significance on cytologic smear of cervix (ASC-US): Secondary | ICD-10-CM | POA: Diagnosis not present

## 2021-03-03 DIAGNOSIS — K5190 Ulcerative colitis, unspecified, without complications: Secondary | ICD-10-CM | POA: Diagnosis not present

## 2021-03-13 ENCOUNTER — Other Ambulatory Visit (HOSPITAL_COMMUNITY): Payer: Self-pay

## 2021-03-14 ENCOUNTER — Other Ambulatory Visit (HOSPITAL_COMMUNITY): Payer: Self-pay

## 2021-03-18 ENCOUNTER — Other Ambulatory Visit (HOSPITAL_BASED_OUTPATIENT_CLINIC_OR_DEPARTMENT_OTHER): Payer: Self-pay

## 2021-03-18 ENCOUNTER — Other Ambulatory Visit: Payer: Self-pay | Admitting: Internal Medicine

## 2021-03-18 ENCOUNTER — Other Ambulatory Visit (HOSPITAL_COMMUNITY): Payer: Self-pay

## 2021-03-18 MED ORDER — LIALDA 1.2 G PO TBEC
2.4000 g | DELAYED_RELEASE_TABLET | Freq: Every day | ORAL | 1 refills | Status: DC
  Filled 2021-03-18: qty 180, 90d supply, fill #0
  Filled 2021-06-10: qty 180, 90d supply, fill #1

## 2021-03-19 ENCOUNTER — Other Ambulatory Visit (HOSPITAL_COMMUNITY): Payer: Self-pay

## 2021-03-19 MED ORDER — SERTRALINE HCL 50 MG PO TABS
ORAL_TABLET | ORAL | 3 refills | Status: DC
  Filled 2021-03-19: qty 90, 90d supply, fill #0
  Filled 2021-06-11: qty 90, 90d supply, fill #1
  Filled 2021-09-03: qty 90, 90d supply, fill #2
  Filled 2021-12-09: qty 90, 90d supply, fill #3

## 2021-04-02 ENCOUNTER — Other Ambulatory Visit (HOSPITAL_COMMUNITY): Payer: Self-pay

## 2021-04-04 ENCOUNTER — Other Ambulatory Visit (HOSPITAL_COMMUNITY): Payer: Self-pay

## 2021-04-09 ENCOUNTER — Other Ambulatory Visit (HOSPITAL_COMMUNITY): Payer: Self-pay

## 2021-04-17 ENCOUNTER — Telehealth (HOSPITAL_BASED_OUTPATIENT_CLINIC_OR_DEPARTMENT_OTHER): Payer: 59 | Admitting: Family Medicine

## 2021-04-17 ENCOUNTER — Other Ambulatory Visit: Payer: Self-pay

## 2021-04-24 ENCOUNTER — Other Ambulatory Visit (HOSPITAL_COMMUNITY): Payer: Self-pay

## 2021-04-24 ENCOUNTER — Ambulatory Visit (INDEPENDENT_AMBULATORY_CARE_PROVIDER_SITE_OTHER): Payer: 59 | Admitting: Nurse Practitioner

## 2021-04-24 ENCOUNTER — Other Ambulatory Visit: Payer: Self-pay

## 2021-04-24 ENCOUNTER — Encounter (HOSPITAL_BASED_OUTPATIENT_CLINIC_OR_DEPARTMENT_OTHER): Payer: Self-pay | Admitting: Nurse Practitioner

## 2021-04-24 DIAGNOSIS — J0140 Acute pansinusitis, unspecified: Secondary | ICD-10-CM | POA: Insufficient documentation

## 2021-04-24 HISTORY — DX: Acute pansinusitis, unspecified: J01.40

## 2021-04-24 MED ORDER — AMOXICILLIN-POT CLAVULANATE 875-125 MG PO TABS
1.0000 | ORAL_TABLET | Freq: Two times a day (BID) | ORAL | 0 refills | Status: DC
  Filled 2021-04-24: qty 10, 5d supply, fill #0

## 2021-04-24 NOTE — Progress Notes (Signed)
Virtual Visit Encounter telephone visit.   I connected with  [name] on 04/29/21 at  2:50 PM EST by secure audio and/or video enabled telemedicine application. I verified that I am speaking with the correct person using two identifiers.   I introduced myself as a Designer, jewellery with the practice. The limitations of evaluation and management by telemedicine discussed with the patient and the availability of in person appointments. The patient expressed verbal understanding and consent to proceed.  Participating parties in this visit include: Myself and patient  The patient is: Patient Location: Home I am: Provider Location: Office/Clinic Subjective:    CC and HPI: [name] is a 56 y.o. year old female presenting for new evaluation and treatment of sinus pain and pressure, congestion, headache, cough, and fatigue.  Huda reports she recently had a viral infection and has since recovered with the exception of ongoing sinus symptoms for the past several weeks. She has tried OTC products, but these are not effective at relieving her symptoms. She reports her symptoms were improving until about a week ago, but have slowly worsened to the point that she felt she needed to be seen. She is experiencing a low grade fever on and off. She denies shortness of breath, CP, palpitations, or difficulty breathing.   Past medical history, Surgical history, Family history not pertinant except as noted below, Social history, Allergies, and medications have been entered into the medical record, reviewed, and corrections made.   Review of Systems:  All review of systems negative except what is listed in the HPI  Objective:    Alert and oriented x 4 Speaking in clear sentences with no shortness of breath. Audibly congested No distress.  Impression and Recommendations:    Problem List Items Addressed This Visit     Subacute pansinusitis - Primary    Symptoms present for 3-4 weeks following  suspected viral infection. Given length of time symptoms have been present and the trajectory of symptoms, antibiotic therapy is warranted at this time.  Recommend augmentin for 5  Days and to follow-up if symptoms worsen or fail to improve by the end of the week.  OK to continue mucinex, flonase, and OTC cough medication PRN for symptoms.       Relevant Medications   amoxicillin-clavulanate (AUGMENTIN) 875-125 MG tablet    orders and follow up as documented in EMR I discussed the assessment and treatment plan with the patient. The patient was provided an opportunity to ask questions and all were answered. The patient agreed with the plan and demonstrated an understanding of the instructions.   The patient was advised to call back or seek an in-person evaluation if the symptoms worsen or if the condition fails to improve as anticipated.  Follow-Up: prn  I provided 17 minutes of non-face-to-face interaction with this non face-to-face encounter including intake, same-day documentation, and chart review.   Orma Render, NP , DNP, AGNP-c [place] [facility] [phone] (fax)

## 2021-04-29 NOTE — Assessment & Plan Note (Signed)
Symptoms present for 3-4 weeks following suspected viral infection. Given length of time symptoms have been present and the trajectory of symptoms, antibiotic therapy is warranted at this time.  Recommend augmentin for 5  Days and to follow-up if symptoms worsen or fail to improve by the end of the week.  OK to continue mucinex, flonase, and OTC cough medication PRN for symptoms.

## 2021-05-05 ENCOUNTER — Other Ambulatory Visit (HOSPITAL_COMMUNITY): Payer: Self-pay

## 2021-05-14 ENCOUNTER — Other Ambulatory Visit (HOSPITAL_COMMUNITY): Payer: Self-pay

## 2021-05-15 ENCOUNTER — Other Ambulatory Visit (HOSPITAL_COMMUNITY): Payer: Self-pay

## 2021-05-19 IMAGING — MG DIGITAL SCREENING BILAT W/ TOMO W/ CAD
8 series · 9 of 24 positions shown · non-contrast
Comparison: Previous exam(s).

CLINICAL DATA: Screening.

EXAM:
DIGITAL SCREENING BILATERAL MAMMOGRAM WITH TOMO AND CAD

[R MLO synth-2D]
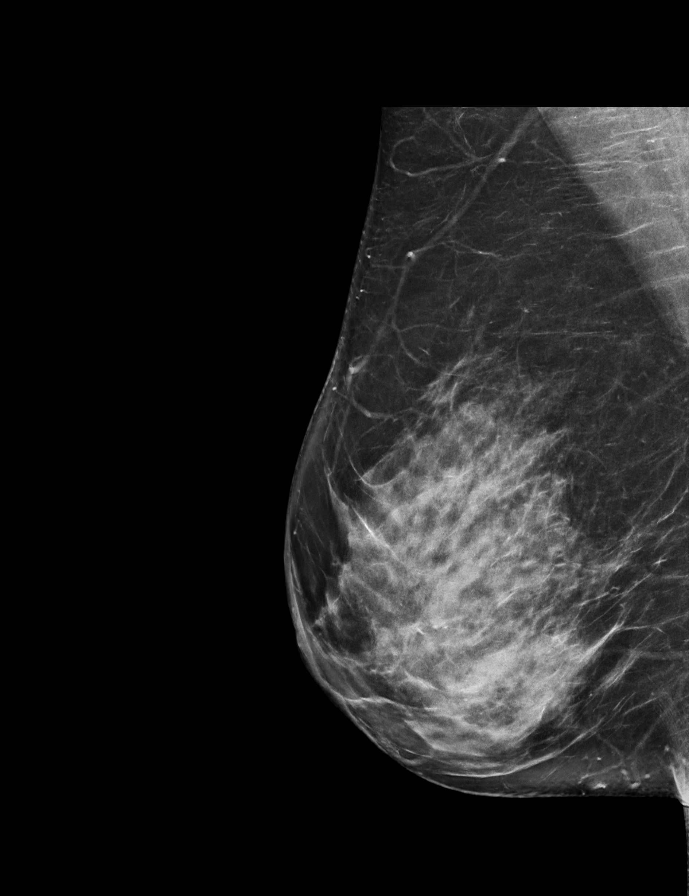

[L MLO synth-2D]
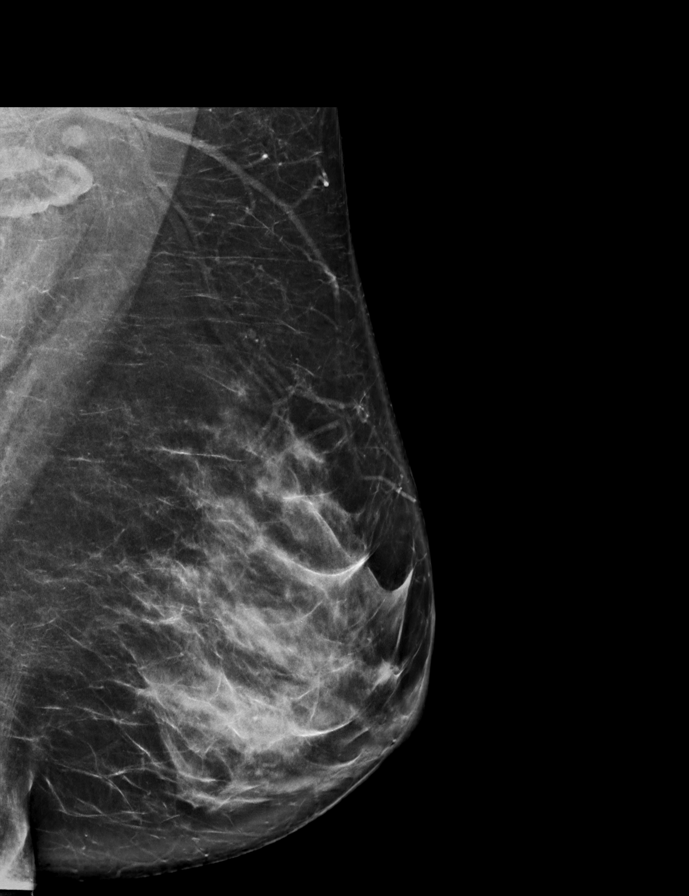

[R CC synth-2D]
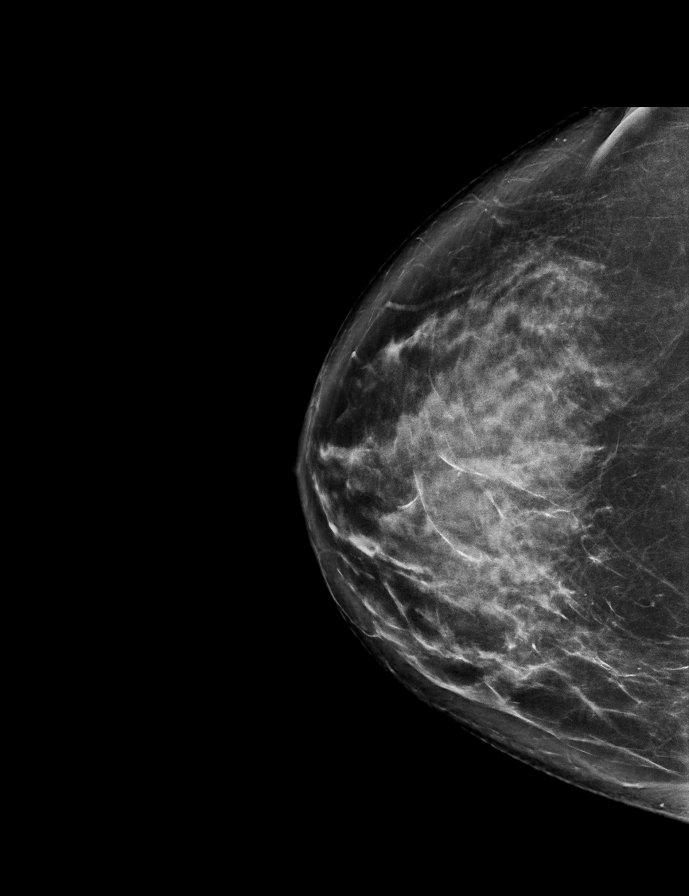

[L CC synth-2D]
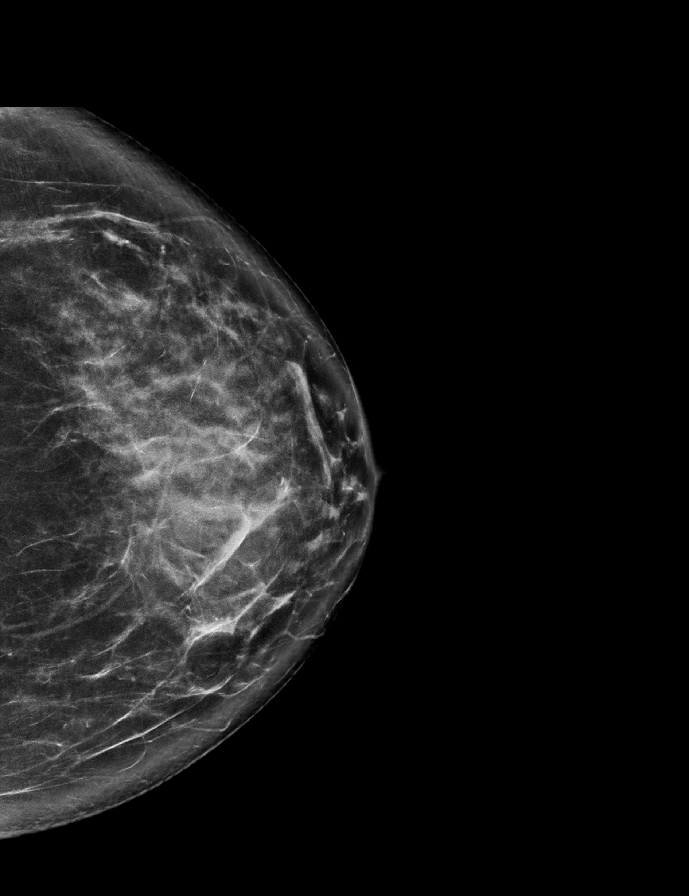

[L CC tomo · 2 of 76 frames shown]
[frame 25/76]
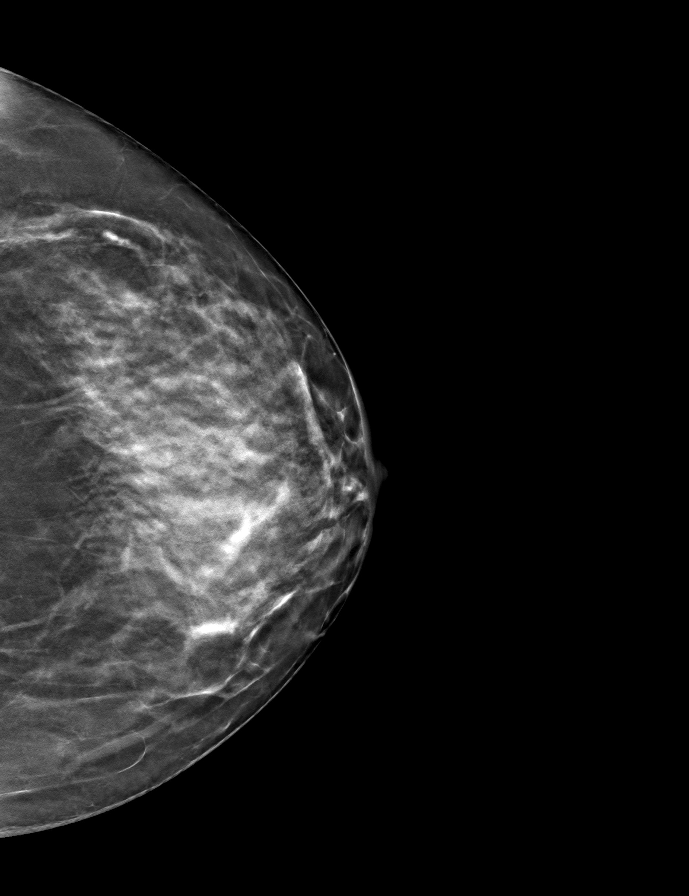
[frame 39/76]
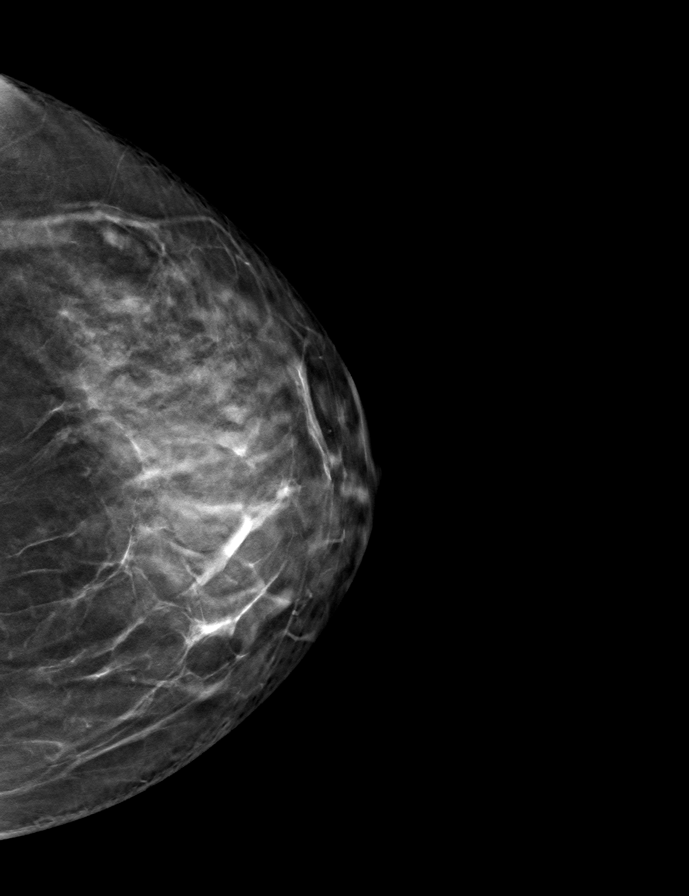

[L MLO tomo · tomo slice 43/84.0]
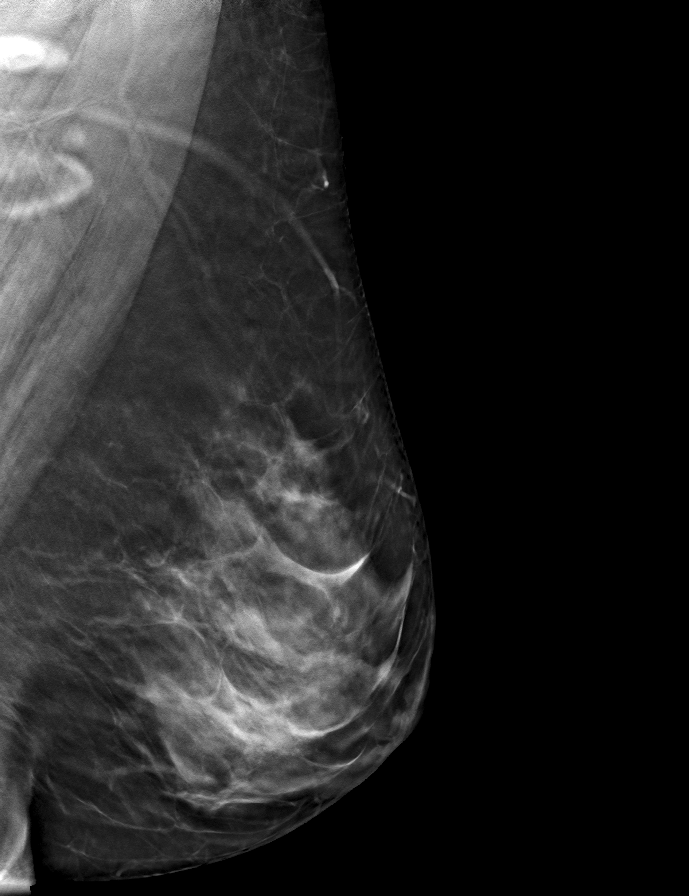

[R CC tomo · tomo slice 41/82.0]
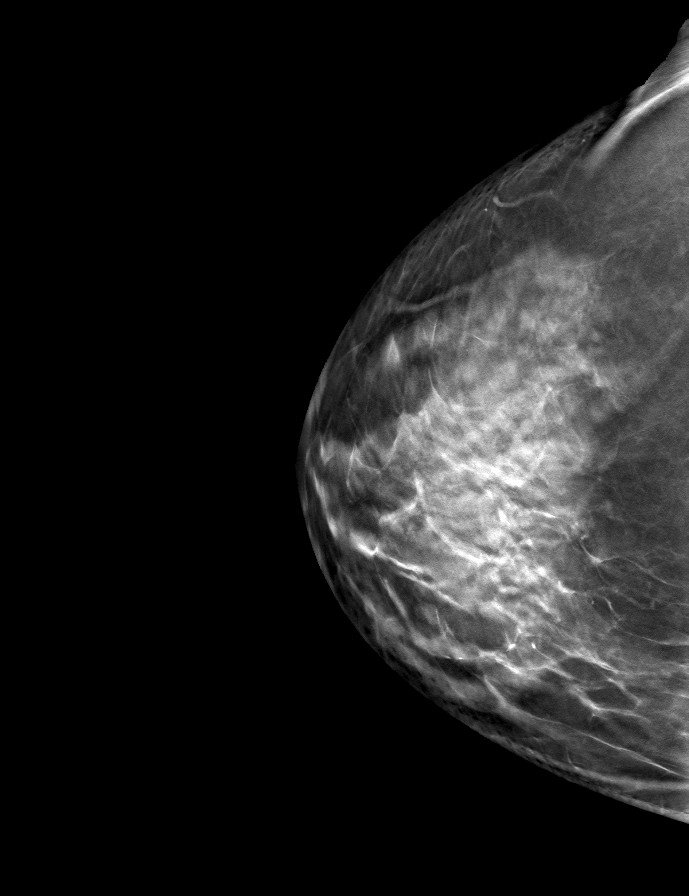

[R MLO tomo · tomo slice 40/79.0]
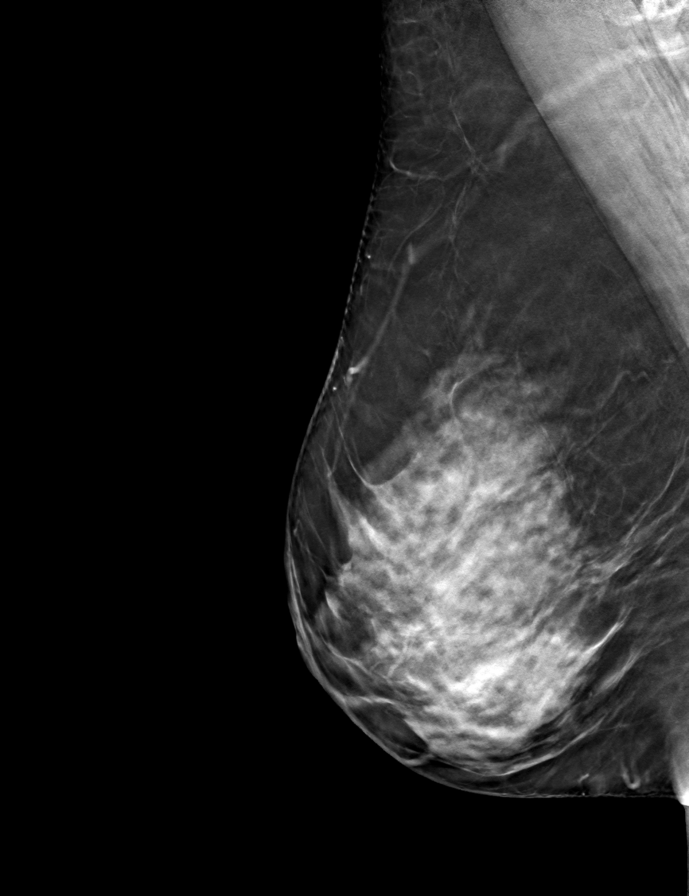

[9 of 24 positions shown; findings below may reference images not displayed]

ACR Breast Density Category c: The breast tissue is heterogeneously
dense, which may obscure small masses.
FINDINGS: There are no findings suspicious for malignancy. Images were
processed with CAD.
IMPRESSION: No mammographic evidence of malignancy. A result letter of this
screening mammogram will be mailed directly to the patient.

RECOMMENDATION:
Screening mammogram in one year. (Code:[id number])

BI-RADS CATEGORY  1: Negative.

## 2021-06-02 ENCOUNTER — Other Ambulatory Visit (HOSPITAL_COMMUNITY): Payer: Self-pay

## 2021-06-11 ENCOUNTER — Other Ambulatory Visit (HOSPITAL_COMMUNITY): Payer: Self-pay

## 2021-06-12 ENCOUNTER — Other Ambulatory Visit (HOSPITAL_COMMUNITY): Payer: Self-pay

## 2021-07-01 ENCOUNTER — Other Ambulatory Visit (HOSPITAL_COMMUNITY): Payer: Self-pay

## 2021-07-09 ENCOUNTER — Other Ambulatory Visit (HOSPITAL_BASED_OUTPATIENT_CLINIC_OR_DEPARTMENT_OTHER): Payer: Self-pay | Admitting: Nurse Practitioner

## 2021-07-09 ENCOUNTER — Other Ambulatory Visit (HOSPITAL_COMMUNITY): Payer: Self-pay

## 2021-07-09 DIAGNOSIS — E663 Overweight: Secondary | ICD-10-CM

## 2021-07-09 MED ORDER — SAXENDA 18 MG/3ML ~~LOC~~ SOPN
3.0000 mg | PEN_INJECTOR | Freq: Every day | SUBCUTANEOUS | 11 refills | Status: DC
  Filled 2021-07-09: qty 15, 30d supply, fill #0

## 2021-07-11 ENCOUNTER — Other Ambulatory Visit (HOSPITAL_COMMUNITY): Payer: Self-pay

## 2021-07-24 ENCOUNTER — Other Ambulatory Visit (HOSPITAL_COMMUNITY): Payer: Self-pay

## 2021-08-04 ENCOUNTER — Other Ambulatory Visit (HOSPITAL_COMMUNITY): Payer: Self-pay

## 2021-08-12 ENCOUNTER — Other Ambulatory Visit (HOSPITAL_COMMUNITY): Payer: Self-pay

## 2021-08-13 ENCOUNTER — Other Ambulatory Visit (HOSPITAL_COMMUNITY): Payer: Self-pay

## 2021-08-29 ENCOUNTER — Telehealth (HOSPITAL_BASED_OUTPATIENT_CLINIC_OR_DEPARTMENT_OTHER): Payer: Self-pay | Admitting: Nurse Practitioner

## 2021-08-29 NOTE — Telephone Encounter (Signed)
Received a fax on 6/16 regarding PA for Saxenda 18 MG/3 ML pen. Paperwork is in providers yellow tray. Please advise.

## 2021-09-01 ENCOUNTER — Other Ambulatory Visit (HOSPITAL_COMMUNITY): Payer: Self-pay

## 2021-09-01 NOTE — Telephone Encounter (Signed)
Received paperwork for additional info and form is needed back by 10/08/21. Paperwork is in providers yellow tray. Please advise.

## 2021-09-03 ENCOUNTER — Other Ambulatory Visit: Payer: Self-pay | Admitting: Internal Medicine

## 2021-09-03 ENCOUNTER — Other Ambulatory Visit (HOSPITAL_COMMUNITY): Payer: Self-pay

## 2021-09-03 ENCOUNTER — Encounter (HOSPITAL_BASED_OUTPATIENT_CLINIC_OR_DEPARTMENT_OTHER): Payer: Self-pay | Admitting: Nurse Practitioner

## 2021-09-03 ENCOUNTER — Ambulatory Visit (INDEPENDENT_AMBULATORY_CARE_PROVIDER_SITE_OTHER): Payer: 59 | Admitting: Nurse Practitioner

## 2021-09-03 VITALS — BP 117/78 | HR 86 | Ht 64.0 in | Wt 153.0 lb

## 2021-09-03 DIAGNOSIS — D849 Immunodeficiency, unspecified: Secondary | ICD-10-CM

## 2021-09-03 DIAGNOSIS — K51919 Ulcerative colitis, unspecified with unspecified complications: Secondary | ICD-10-CM

## 2021-09-03 DIAGNOSIS — J029 Acute pharyngitis, unspecified: Secondary | ICD-10-CM | POA: Diagnosis not present

## 2021-09-03 DIAGNOSIS — Z20818 Contact with and (suspected) exposure to other bacterial communicable diseases: Secondary | ICD-10-CM

## 2021-09-03 LAB — POCT RAPID STREP A (OFFICE): Rapid Strep A Screen: NEGATIVE

## 2021-09-03 MED ORDER — CEFDINIR 300 MG PO CAPS
300.0000 mg | ORAL_CAPSULE | Freq: Two times a day (BID) | ORAL | 0 refills | Status: DC
  Filled 2021-09-03: qty 10, 5d supply, fill #0

## 2021-09-03 MED ORDER — LIALDA 1.2 G PO TBEC
2.4000 g | DELAYED_RELEASE_TABLET | Freq: Every day | ORAL | 1 refills | Status: DC
  Filled 2021-09-03: qty 180, 90d supply, fill #0
  Filled 2021-12-09: qty 180, 90d supply, fill #1

## 2021-09-03 NOTE — Patient Instructions (Signed)
I have sent in the order for the monospot test to see if that comes back positive.  If your symptoms worsen at all or if they do not get any better, please let me know immediately.

## 2021-09-03 NOTE — Progress Notes (Signed)
Orma Render, DNP, AGNP-c [facility] [address]  [place], [place] [address] [phone]  Subjective:   [name] is a 56 y.o. female presents to day for recent exposure to strep throat. [name] tells me that she started to develop symptoms over the weekend with a very sore throat.  She is also experienced some congestion and increased fatigue.  She has not had any fevers, chills, nausea, vomiting.  She was exposed to strep throat by her niece recently.    PMH, Medications, and Allergies reviewed and updated in chart.   ROS negative except for what is listed in HPI. Objective:  BP 117/78   Pulse 86   Ht 5' 4"  (1.626 m)   Wt 153 lb (69.4 kg)   SpO2 97%   BMI 26.26 kg/m  Physical Exam Vitals and nursing note reviewed.  Constitutional:      Appearance: She is ill-appearing.  HENT:     Head: Normocephalic.     Right Ear: A middle ear effusion is present.     Left Ear: A middle ear effusion is present.     Nose:     Right Turbinates: Enlarged.     Left Turbinates: Enlarged.     Right Sinus: Maxillary sinus tenderness present. No frontal sinus tenderness.     Left Sinus: Maxillary sinus tenderness present. No frontal sinus tenderness.     Mouth/Throat:     Pharynx: Uvula midline. Pharyngeal swelling and posterior oropharyngeal erythema present.  Cardiovascular:     Rate and Rhythm: Normal rate and regular rhythm.     Pulses: Normal pulses.  Pulmonary:     Effort: Pulmonary effort is normal.     Breath sounds: Normal breath sounds.  Musculoskeletal:     Cervical back: Normal range of motion.  Lymphadenopathy:     Cervical: Cervical adenopathy present.  Skin:    General: Skin is warm and dry.     Capillary Refill: Capillary refill takes less than 2 seconds.  Neurological:     General: No focal deficit present.     Mental Status: She is alert and oriented to person, place, and time.           Assessment &  Plan:   Problem List Items Addressed This Visit     Sore throat - Primary    Patient presents with symptoms of extremely sore throat after exposure to strep throat over the weekend.  In office strep test today was negative however there is significant erythema to the posterior oropharynx as well as tonsillar swelling bilaterally present.  Nasal turbinates are also enlarged and swollen.  Clear effusion present bilaterally in the ears as well.  Given the patient's symptoms and appearance today I do feel that it is appropriate to begin antibiotic treatment for suspected bacterial infection.  Patient is immunocompromised due to ulcerative colitis and treatment for this therefore she is at higher risk of significant infection.  At this time no alarm symptoms are present.  Instructed patient to monitor symptoms closely and notify immediately if they worsen or fail to improve over the next several days.      Relevant Orders   POCT rapid strep A (Completed)   Immunocompromised ([place])   Other Visit Diagnoses     Strep throat exposure       Relevant Medications   cefdinir (OMNICEF) 300 MG capsule   Ulcerative colitis with complication, unspecified location ([place])   (Chronic)  Orma Render, DNP, AGNP-c 09/10/2021  6:44 PM

## 2021-09-03 NOTE — Telephone Encounter (Signed)
Paperwork processed to [employer] and sent on 6/20. Fax successful.

## 2021-09-04 ENCOUNTER — Telehealth (HOSPITAL_BASED_OUTPATIENT_CLINIC_OR_DEPARTMENT_OTHER): Payer: Self-pay

## 2021-09-04 NOTE — Telephone Encounter (Signed)
Received fax from [place] that patient was approved for Saxenda 43m/3ML

## 2021-09-04 NOTE — Telephone Encounter (Signed)
Received fax from [employer] company approving coverage for Saxenda 18 MG until 09/04/2022. Documents will be in provider's yellow dot tray for review. Please advise.

## 2021-09-08 DIAGNOSIS — H5203 Hypermetropia, bilateral: Secondary | ICD-10-CM | POA: Diagnosis not present

## 2021-09-10 DIAGNOSIS — J029 Acute pharyngitis, unspecified: Secondary | ICD-10-CM | POA: Insufficient documentation

## 2021-09-10 NOTE — Assessment & Plan Note (Signed)
Patient presents with symptoms of extremely sore throat after exposure to strep throat over the weekend.  In office strep test today was negative however there is significant erythema to the posterior oropharynx as well as tonsillar swelling bilaterally present.  Nasal turbinates are also enlarged and swollen.  Clear effusion present bilaterally in the ears as well.  Given the patient's symptoms and appearance today I do feel that it is appropriate to begin antibiotic treatment for suspected bacterial infection.  Patient is immunocompromised due to ulcerative colitis and treatment for this therefore she is at higher risk of significant infection.  At this time no alarm symptoms are present.  Instructed patient to monitor symptoms closely and notify immediately if they worsen or fail to improve over the next several days.

## 2021-09-11 ENCOUNTER — Other Ambulatory Visit (HOSPITAL_COMMUNITY): Payer: Self-pay

## 2021-09-12 ENCOUNTER — Ambulatory Visit (INDEPENDENT_AMBULATORY_CARE_PROVIDER_SITE_OTHER): Payer: 59 | Admitting: Nurse Practitioner

## 2021-09-12 ENCOUNTER — Other Ambulatory Visit (HOSPITAL_BASED_OUTPATIENT_CLINIC_OR_DEPARTMENT_OTHER): Payer: Self-pay

## 2021-09-12 ENCOUNTER — Other Ambulatory Visit (HOSPITAL_COMMUNITY): Payer: Self-pay

## 2021-09-12 DIAGNOSIS — Z20818 Contact with and (suspected) exposure to other bacterial communicable diseases: Secondary | ICD-10-CM

## 2021-09-12 DIAGNOSIS — J0140 Acute pansinusitis, unspecified: Secondary | ICD-10-CM

## 2021-09-12 MED ORDER — LEVOFLOXACIN 500 MG PO TABS
500.0000 mg | ORAL_TABLET | Freq: Every day | ORAL | 0 refills | Status: AC
  Filled 2021-09-12: qty 7, 7d supply, fill #0

## 2021-09-12 MED ORDER — FLUCONAZOLE 150 MG PO TABS
150.0000 mg | ORAL_TABLET | Freq: Once | ORAL | 1 refills | Status: AC
  Filled 2021-09-12: qty 2, 3d supply, fill #0

## 2021-09-23 ENCOUNTER — Telehealth: Payer: Self-pay

## 2021-09-23 NOTE — Telephone Encounter (Signed)
Prior Authorization for SIMPONI 100MG/ML has been approved.    KeyFredrik Rigger PA Case ID: [id number]  Effective dates: 09/15/21 through 09/15/22

## 2021-10-01 DIAGNOSIS — D485 Neoplasm of uncertain behavior of skin: Secondary | ICD-10-CM | POA: Diagnosis not present

## 2021-10-01 DIAGNOSIS — L988 Other specified disorders of the skin and subcutaneous tissue: Secondary | ICD-10-CM | POA: Diagnosis not present

## 2021-10-01 DIAGNOSIS — Z08 Encounter for follow-up examination after completed treatment for malignant neoplasm: Secondary | ICD-10-CM | POA: Diagnosis not present

## 2021-10-01 DIAGNOSIS — C44519 Basal cell carcinoma of skin of other part of trunk: Secondary | ICD-10-CM | POA: Diagnosis not present

## 2021-10-01 DIAGNOSIS — L814 Other melanin hyperpigmentation: Secondary | ICD-10-CM | POA: Diagnosis not present

## 2021-10-01 DIAGNOSIS — L821 Other seborrheic keratosis: Secondary | ICD-10-CM | POA: Diagnosis not present

## 2021-10-01 DIAGNOSIS — D239 Other benign neoplasm of skin, unspecified: Secondary | ICD-10-CM | POA: Diagnosis not present

## 2021-10-01 DIAGNOSIS — L538 Other specified erythematous conditions: Secondary | ICD-10-CM | POA: Diagnosis not present

## 2021-10-01 DIAGNOSIS — L820 Inflamed seborrheic keratosis: Secondary | ICD-10-CM | POA: Diagnosis not present

## 2021-10-01 DIAGNOSIS — Z85828 Personal history of other malignant neoplasm of skin: Secondary | ICD-10-CM | POA: Diagnosis not present

## 2021-10-08 ENCOUNTER — Other Ambulatory Visit (HOSPITAL_COMMUNITY): Payer: Self-pay

## 2021-10-10 ENCOUNTER — Other Ambulatory Visit (HOSPITAL_COMMUNITY): Payer: Self-pay

## 2021-10-19 NOTE — Progress Notes (Signed)
No charge for visit- antibiotic therapy changed for recent URI/Sinusitis

## 2021-10-20 ENCOUNTER — Telehealth: Payer: Self-pay | Admitting: Internal Medicine

## 2021-10-20 ENCOUNTER — Other Ambulatory Visit (HOSPITAL_COMMUNITY): Payer: Self-pay

## 2021-10-20 DIAGNOSIS — K51919 Ulcerative colitis, unspecified with unspecified complications: Secondary | ICD-10-CM

## 2021-10-20 DIAGNOSIS — R197 Diarrhea, unspecified: Secondary | ICD-10-CM

## 2021-10-20 MED ORDER — BUDESONIDE ER 9 MG PO TB24
9.0000 mg | ORAL_TABLET | Freq: Every day | ORAL | 1 refills | Status: DC
  Filled 2021-10-20: qty 30, 30d supply, fill #0

## 2021-10-20 NOTE — Telephone Encounter (Signed)
Spoke with pt and she is aware of Dr. Quentin Mulling recommendations. Pt knows to come to the lab and once specimen submitted she will start on Uceris daily. Script sent to pharmacy.

## 2021-10-20 NOTE — Telephone Encounter (Signed)
Inbound call from patient stating that she is having a Ulcerative colitis flare up and is requesting a call back to discuss. Please advise.

## 2021-10-20 NOTE — Telephone Encounter (Signed)
Pt states a few weeks ago she got a sinus and ear infection. She was first given omnicef and then given a round of Levaquin. She took a probiotic but now reports she is having a UC flare. She reports yesterday she had about 12 diarrhea stools. She is having the urgency and abd pain and there is a pink tinge in the diarrhea but not BRB. She is taking simponi and Lialda. She wasn't sure if she needed a short course of steroids or what. Please advise.

## 2021-10-20 NOTE — Telephone Encounter (Signed)
Given antibiotics lets just be sure this is not C. Difficile Would recommend we check C. difficile PCR and once admitted she can begin Uceris 9 mg daily x6 to 8 weeks Continuing other medications as scheduled

## 2021-10-21 ENCOUNTER — Other Ambulatory Visit (HOSPITAL_COMMUNITY): Payer: Self-pay

## 2021-10-21 ENCOUNTER — Telehealth: Payer: Self-pay | Admitting: Pharmacy Technician

## 2021-10-21 NOTE — Telephone Encounter (Signed)
Patient Advocate Encounter  Received notification from [employer] that prior authorization for BUDESONIDE ER 9MG is required.   PA submitted on 8.8.23 Key [id number] Status is pending    Luciano Cutter, CPhT Patient Advocate Phone: [phone]

## 2021-10-22 ENCOUNTER — Other Ambulatory Visit: Payer: 59

## 2021-10-22 DIAGNOSIS — C44519 Basal cell carcinoma of skin of other part of trunk: Secondary | ICD-10-CM | POA: Diagnosis not present

## 2021-10-22 DIAGNOSIS — R197 Diarrhea, unspecified: Secondary | ICD-10-CM | POA: Diagnosis not present

## 2021-10-22 DIAGNOSIS — K51919 Ulcerative colitis, unspecified with unspecified complications: Secondary | ICD-10-CM | POA: Diagnosis not present

## 2021-10-23 ENCOUNTER — Other Ambulatory Visit (HOSPITAL_COMMUNITY): Payer: Self-pay

## 2021-10-24 ENCOUNTER — Other Ambulatory Visit (HOSPITAL_COMMUNITY): Payer: Self-pay

## 2021-10-24 LAB — CLOSTRIDIUM DIFFICILE BY PCR: Toxigenic C. Difficile by PCR: NEGATIVE

## 2021-10-27 ENCOUNTER — Telehealth: Payer: Self-pay

## 2021-10-28 ENCOUNTER — Other Ambulatory Visit (HOSPITAL_COMMUNITY): Payer: Self-pay

## 2021-11-03 ENCOUNTER — Telehealth: Payer: Self-pay | Admitting: Internal Medicine

## 2021-11-03 ENCOUNTER — Other Ambulatory Visit (HOSPITAL_COMMUNITY): Payer: Self-pay

## 2021-11-03 ENCOUNTER — Other Ambulatory Visit: Payer: Self-pay

## 2021-11-03 DIAGNOSIS — K51919 Ulcerative colitis, unspecified with unspecified complications: Secondary | ICD-10-CM

## 2021-11-03 MED ORDER — PREDNISONE 10 MG PO TABS
ORAL_TABLET | ORAL | 0 refills | Status: DC
  Filled 2021-11-03: qty 126, 56d supply, fill #0

## 2021-11-03 NOTE — Telephone Encounter (Signed)
Spoke with pt and she is aware of Dr. Vena Rua recommendations. Lab order in and prescription sent to pharmacy. She will come for lab on 11/13/21.

## 2021-11-03 NOTE — Telephone Encounter (Signed)
Pt states she started taking the uceris 8/10. She reports she is having more bleeding, BRB along with urgency, gas, and mucous. Her stools are a little firmer and she has to keep running to the bathroom. She wants to know if she just hasn't been on the uceris long enough or if she needs something different.

## 2021-11-03 NOTE — Telephone Encounter (Signed)
I would have expected it to work for now With change to prednisone 40 mg x 7 days and decrease by 5 mg every 7 days until off Stop the budesonide Are we able to get Simponi drug and Ab levels?

## 2021-11-03 NOTE — Telephone Encounter (Signed)
Left message for pt to call back  °

## 2021-11-03 NOTE — Telephone Encounter (Signed)
Patient called stating the budesonide Dr. Hilarie Fredrickson put her on was not working at all.  Patient asked for a phone call to discuss alternatives.  Thank you.

## 2021-11-11 ENCOUNTER — Other Ambulatory Visit (HOSPITAL_COMMUNITY): Payer: Self-pay

## 2021-11-12 ENCOUNTER — Other Ambulatory Visit (HOSPITAL_COMMUNITY): Payer: Self-pay

## 2021-11-12 ENCOUNTER — Ambulatory Visit (INDEPENDENT_AMBULATORY_CARE_PROVIDER_SITE_OTHER): Payer: 59 | Admitting: Nurse Practitioner

## 2021-11-12 ENCOUNTER — Encounter (HOSPITAL_BASED_OUTPATIENT_CLINIC_OR_DEPARTMENT_OTHER): Payer: Self-pay | Admitting: Nurse Practitioner

## 2021-11-12 ENCOUNTER — Other Ambulatory Visit: Payer: 59

## 2021-11-12 VITALS — BP 112/60 | HR 60 | Ht 64.0 in | Wt 163.5 lb

## 2021-11-12 DIAGNOSIS — K5150 Left sided colitis without complications: Secondary | ICD-10-CM | POA: Diagnosis not present

## 2021-11-12 DIAGNOSIS — K51919 Ulcerative colitis, unspecified with unspecified complications: Secondary | ICD-10-CM | POA: Diagnosis not present

## 2021-11-12 DIAGNOSIS — E559 Vitamin D deficiency, unspecified: Secondary | ICD-10-CM

## 2021-11-12 DIAGNOSIS — F40243 Fear of flying: Secondary | ICD-10-CM | POA: Diagnosis not present

## 2021-11-12 DIAGNOSIS — Z79899 Other long term (current) drug therapy: Secondary | ICD-10-CM

## 2021-11-12 DIAGNOSIS — D849 Immunodeficiency, unspecified: Secondary | ICD-10-CM

## 2021-11-12 DIAGNOSIS — E782 Mixed hyperlipidemia: Secondary | ICD-10-CM

## 2021-11-12 DIAGNOSIS — Z23 Encounter for immunization: Secondary | ICD-10-CM

## 2021-11-12 DIAGNOSIS — Z0000 Encounter for general adult medical examination without abnormal findings: Secondary | ICD-10-CM

## 2021-11-12 MED ORDER — AZITHROMYCIN 250 MG PO TABS
ORAL_TABLET | ORAL | 0 refills | Status: AC
  Filled 2021-11-12: qty 6, 5d supply, fill #0

## 2021-11-12 MED ORDER — ALPRAZOLAM 0.25 MG PO TABS
0.2500 mg | ORAL_TABLET | Freq: Two times a day (BID) | ORAL | 1 refills | Status: DC | PRN
  Filled 2021-11-12: qty 20, 10d supply, fill #0
  Filled 2022-01-22: qty 20, 10d supply, fill #1

## 2021-11-12 NOTE — Patient Instructions (Addendum)
It was a pleasure seeing you today. I hope your time spent with [place] was pleasant and helpful. Please let us know if there is anything we can do to improve the service you receive.   I will let you know if anything  I have sent the medication for your trip. I hope you have a great time!!   The following orders have been placed for you today:  Orders Placed This Encounter  Procedures   B12 and Folate Panel    Order Specific Question:   Release to patient    Answer:   Immediate   CBC With Diff/Platelet    Order Specific Question:   Release to patient    Answer:   Immediate   Comprehensive metabolic panel    Order Specific Question:   Has the patient fasted?    Answer:   Yes    Order Specific Question:   Release to patient    Answer:   Immediate   Hemoglobin A1c    Order Specific Question:   Release to patient    Answer:   Immediate   Thyroid Panel With TSH    Order Specific Question:   Release to patient    Answer:   Immediate   VITAMIN D 25 Hydroxy (Vit-D Deficiency, Fractures)    Order Specific Question:   Release to patient    Answer:   Immediate   Lipid panel    Order Specific Question:   Has the patient fasted?    Answer:   Yes    Order Specific Question:   Release to patient    Answer:   Immediate   QuantiFERON-TB Gold Plus     Important Office Information Lab Results If labs were ordered, please note that you will see results through [place] as soon as they come available from [place].  It takes up to 5 business days for the results to be routed to me and for me to review them once all of the lab results have come through from [facility]. I will make recommendations based on your results and send these through [place] or someone from the office will call you to discuss. If your labs are abnormal, we may contact you to schedule a visit to discuss the results and make recommendations.  If you have not heard from [place] within 5 business days or you have waited longer than a week  and your lab results have not come through on [place], please feel free to call the office or send a message through [place] to follow-up on these labs.   Referrals If referrals were placed today, the office where the referral was sent will contact you either by phone or through [place] to set up scheduling. Please note that it can take up to a week for the referral office to contact you. If you do not hear from them in a week, please contact the referral office directly to inquire about scheduling.   Condition Treated If your condition worsens or you begin to have new symptoms, please schedule a follow-up appointment for further evaluation. If you are not sure if an appointment is needed, you may call the office to leave a message for the nurse and someone will contact you with recommendations.  If you have an urgent or life threatening emergency, please do not call the office, but seek emergency evaluation by calling 911 or going to the nearest emergency room for evaluation.   MyChart and Phone Calls Please do not use  MyChart for urgent messages. It may take up to 3 business days for MyChart messages to be read by staff and if they are unable to handle the request, an additional 3 business days for them to be routed to me and for my response.  Messages sent to the provider through [place] do not come directly to the provider, please allow time for these messages to be routed and for me to respond.  We get a large volume of MyChart messages daily and these are responded to in the order received.   For urgent messages, please call the office at [phone] and speak with the front office staff or leave a message on the line of my assistant for guidance.  We are seeing patients from the hours of 8:00 am through 5:00 pm and calls directly to the nurse may not be answered immediately due to seeing patients, but your call will be returned as soon as possible.  Phone  messages received after 4:00 PM  Monday through Thursday may not be returned until the following business day. Phone messages received after 11:00 AM on Friday may not be returned until Monday.   After Hours We share on call hours with providers from other offices. If you have an urgent need after hours that cannot wait until the next business day, please contact the on call provider by calling the office number. A nurse will speak with you and contact the provider if needed for recommendations.  If you have an urgent or life threatening emergency after hours, please do not call the on call provider, but seek emergency evaluation by calling 911 or going to the nearest emergency room for evaluation.   Paperwork All paperwork requires a minimum of 5 days to complete and return to you or the designated personnel. Please keep this in mind when bringing in forms or sending requests for paperwork completion to the office.

## 2021-11-12 NOTE — Progress Notes (Signed)
BP 112/60   Pulse 60   Ht 5' 4"  (1.626 m)   Wt 163 lb 8 oz (74.2 kg)   SpO2 96%   BMI 28.06 kg/m    Subjective:    Patient ID: [name], female    DOB: 29-Jun-1965, 56 y.o.   MRN: [id number]  HPI: [name] is a 56 y.o. female presenting on 11/12/2021 for comprehensive medical examination.   Current medical concerns include: none  She reports regular vision exams q1-5y: yes She reports regular dental exams q 19m yes Her diet consists of: healthy options with restrictions for UC She endorses exercise and/or activity of:  3 times a week weight training She works in:  [employer] trader She endorses ETOH use ( 1/wk ) She denies nictoine use  She denies illegal substance use   She is having menstrual periods.  She  endorses perimeopausal symptoms  She is is sexually active. She currently has 1 sexual partners.  She denies concerns today about STI: testing ordered: no  She denies concerns about skin changes today  She denies concerns about bowel changes today - chronic bowel concerns with UC Dr. PHilarie FredricksonGI follows She denies concerns about bladder changes today   Most Recent Depression Screen:     09/03/2021   10:37 AM 07/01/2020   10:55 AM 08/04/2019    8:32 AM 05/04/2018    8:06 AM 09/02/2017    8:41 AM  Depression screen PHQ 2/9  Decreased Interest 0 0 0 0 0  Down, Depressed, Hopeless 0 0 0 0 0  PHQ - 2 Score 0 0 0 0 0  Altered sleeping 0 0   0  Tired, decreased energy 0 0   1  Change in appetite 0 0   0  Feeling bad or failure about yourself  0 0   0  Trouble concentrating 0 0   0  Moving slowly or fidgety/restless 0 0   0  Suicidal thoughts 0 0   0  PHQ-9 Score 0 0   1  Difficult doing work/chores Not difficult at all    Not difficult at all   Most Recent Anxiety Screen:     07/01/2020   10:56 AM  GAD 7 : Generalized Anxiety Score  Nervous, Anxious, on Edge 0  Control/stop worrying 0  Worry too much - different things 1  Trouble relaxing 0   Restless 0  Easily annoyed or irritable 0  Afraid - awful might happen 0  Total GAD 7 Score 1  Anxiety Difficulty Not difficult at all   Most Recent Fall Screen:    11/12/2021   10:53 AM 09/03/2021   10:37 AM 07/01/2020   10:55 AM 08/04/2019    8:32 AM 05/04/2018    8:06 AM  Fall Risk   Falls in the past year? 0 0 0 0 0  Number falls in past yr: 0 0 0    Injury with Fall? 0 0     Risk for fall due to : No Fall Risks No Fall Risks No Fall Risks No Fall Risks   Follow up Education provided;Falls evaluation completed Falls evaluation completed;Education provided  Falls evaluation completed Falls evaluation completed    All ROS negative except what is listed above and in the HPI.   Past medical history, surgical history, medications, allergies, family history and social history reviewed with patient today and changes made to appropriate areas of the chart.  Past Medical History:  Past  Medical History:  Diagnosis Date   DDD (degenerative disc disease), lumbar    S-I joints   Heart murmur    Hyperlipidemia    Superficial granulomatous pyoderma    Ulcerative colitis    Medications:  Current Outpatient Medications on File Prior to Visit  Medication Sig   Golimumab (SIMPONI) 100 MG/ML SOAJ INJECT 1 pen (100 MG) INTO THE SKIN EVERY 21 DAYS.   LIALDA 1.2 g EC tablet TAKE 2 TABLETS BY MOUTH DAILY WITH BREAKFAST   sertraline (ZOLOFT) 50 MG tablet Take 1 tablet by mouth every day.   No current facility-administered medications on file prior to visit.   Surgical History:  Past Surgical History:  Procedure Laterality Date   ABDOMINAL HERNIA REPAIR     age 42   COLONOSCOPY  05/12/2012   multiple    POLYPECTOMY     TONSILLECTOMY     Allergies:  Allergies  Allergen Reactions   Doxycycline Rash   Social History:  Social History   Socioeconomic History   Marital status: Single    Spouse name: Not on file   Number of children: 2   Years of education: Not on file   Highest  education level: Not on file  Occupational History   Occupation: Marine scientist, [employer]   Tobacco Use   Smoking status: Never   Smokeless tobacco: Never  Vaping Use   Vaping Use: Never used  Substance and Sexual Activity   Alcohol use: Yes    Comment: rare   Drug use: No   Sexual activity: Not on file  Other Topics Concern   Not on file  Social History Narrative   Not on file   Social Determinants of Health   Financial Resource Strain: Not on file  Food Insecurity: Not on file  Transportation Needs: Not on file  Physical Activity: Not on file  Stress: Not on file  Social Connections: Not on file  Intimate Partner Violence: Not on file   Social History   Tobacco Use  Smoking Status Never  Smokeless Tobacco Never   Social History   Substance and Sexual Activity  Alcohol Use Yes   Comment: rare   Family History:  Family History  Problem Relation Age of Onset   Macular degeneration Father    Lung cancer Father 94   Breast cancer Mother    Hyperlipidemia Mother    Glaucoma Mother    Colon cancer Neg Hx    Colon polyps Neg Hx    Esophageal cancer Neg Hx    Rectal cancer Neg Hx    Stomach cancer Neg Hx        Objective:    BP 112/60   Pulse 60   Ht 5' 4"  (1.626 m)   Wt 163 lb 8 oz (74.2 kg)   SpO2 96%   BMI 28.06 kg/m   Wt Readings from Last 3 Encounters:  11/12/21 163 lb 8 oz (74.2 kg)  09/03/21 153 lb (69.4 kg)  01/03/21 149 lb 6.4 oz (67.8 kg)    Physical Exam Vitals and nursing note reviewed.  Constitutional:      General: She is not in acute distress.    Appearance: Normal appearance.  HENT:     Head: Normocephalic and atraumatic.     Right Ear: Hearing, tympanic membrane, ear canal and external ear normal.     Left Ear: Hearing, tympanic membrane, ear canal and external ear normal.     Nose: Nose normal.  Right Sinus: No maxillary sinus tenderness or frontal sinus tenderness.     Left Sinus: No maxillary sinus tenderness or frontal  sinus tenderness.     Mouth/Throat:     Lips: Pink.     Mouth: Mucous membranes are moist.     Pharynx: Oropharynx is clear.  Eyes:     General: Lids are normal. Vision grossly intact.     Extraocular Movements: Extraocular movements intact.     Conjunctiva/sclera: Conjunctivae normal.     Pupils: Pupils are equal, round, and reactive to light.     Funduscopic exam:    Right eye: Red reflex present.        Left eye: Red reflex present.    Visual Fields: Right eye visual fields normal and left eye visual fields normal.  Neck:     Thyroid: No thyromegaly.     Vascular: No carotid bruit.  Cardiovascular:     Rate and Rhythm: Normal rate and regular rhythm.     Chest Wall: PMI is not displaced.     Pulses: Normal pulses.          Dorsalis pedis pulses are 2+ on the right side and 2+ on the left side.       Posterior tibial pulses are 2+ on the right side and 2+ on the left side.     Heart sounds: Normal heart sounds. No murmur heard. Pulmonary:     Effort: Pulmonary effort is normal. No respiratory distress.     Breath sounds: Normal breath sounds.  Abdominal:     General: Abdomen is flat. Bowel sounds are normal. There is no distension.     Palpations: Abdomen is soft. There is no hepatomegaly, splenomegaly or mass.     Tenderness: There is no abdominal tenderness. There is no right CVA tenderness, left CVA tenderness, guarding or rebound.  Musculoskeletal:        General: Normal range of motion.     Cervical back: Full passive range of motion without pain, normal range of motion and neck supple. No tenderness.     Right lower leg: No edema.     Left lower leg: No edema.  Feet:     Left foot:     Toenail Condition: Left toenails are normal.  Lymphadenopathy:     Cervical: No cervical adenopathy.     Upper Body:     Right upper body: No supraclavicular adenopathy.     Left upper body: No supraclavicular adenopathy.  Skin:    General: Skin is warm and dry.     Capillary  Refill: Capillary refill takes less than 2 seconds.     Nails: There is no clubbing.  Neurological:     General: No focal deficit present.     Mental Status: She is alert and oriented to person, place, and time.     GCS: GCS eye subscore is 4. GCS verbal subscore is 5. GCS motor subscore is 6.     Sensory: Sensation is intact.     Motor: Motor function is intact.     Coordination: Coordination is intact.     Gait: Gait is intact.     Deep Tendon Reflexes: Reflexes are normal and symmetric.  Psychiatric:        Attention and Perception: Attention normal.        Mood and Affect: Mood normal.        Speech: Speech normal.        Behavior: Behavior normal. Behavior  is cooperative.        Thought Content: Thought content normal.        Cognition and Memory: Cognition and memory normal.        Judgment: Judgment normal.     Results for orders placed or performed in visit on 11/12/21  B12 and Folate Panel  Result Value Ref Range   Vitamin B-12 307 232 - 1,245 pg/mL   Folate 8.5 >3.0 ng/mL  CBC With Diff/Platelet  Result Value Ref Range   WBC 10.4 3.4 - 10.8 x10E3/uL   RBC 4.65 3.77 - 5.28 x10E6/uL   Hemoglobin 13.8 11.1 - 15.9 g/dL   Hematocrit 41.5 34.0 - 46.6 %   MCV 89 79 - 97 fL   MCH 29.7 26.6 - 33.0 pg   MCHC 33.3 31.5 - 35.7 g/dL   RDW 12.6 11.7 - 15.4 %   Platelets 281 150 - 450 x10E3/uL   Neutrophils 82 Not Estab. %   Lymphs 13 Not Estab. %   Monocytes 4 Not Estab. %   Eos 0 Not Estab. %   Basos 0 Not Estab. %   Neutrophils Absolute 8.4 (H) 1.4 - 7.0 x10E3/uL   Lymphocytes Absolute 1.4 0.7 - 3.1 x10E3/uL   Monocytes Absolute 0.4 0.1 - 0.9 x10E3/uL   EOS (ABSOLUTE) 0.0 0.0 - 0.4 x10E3/uL   Basophils Absolute 0.0 0.0 - 0.2 x10E3/uL   Immature Granulocytes 1 Not Estab. %   Immature Grans (Abs) 0.1 0.0 - 0.1 x10E3/uL  Comprehensive metabolic panel  Result Value Ref Range   Glucose 105 (H) 70 - 99 mg/dL   BUN 15 6 - 24 mg/dL   Creatinine, Ser 0.64 0.57 - 1.00 mg/dL    eGFR 105 >59 mL/min/1.73   BUN/Creatinine Ratio 23 9 - 23   Sodium 140 134 - 144 mmol/L   Potassium 4.0 3.5 - 5.2 mmol/L   Chloride 101 96 - 106 mmol/L   CO2 25 20 - 29 mmol/L   Calcium 9.4 8.7 - 10.2 mg/dL   Total Protein 7.4 6.0 - 8.5 g/dL   Albumin 4.4 3.8 - 4.9 g/dL   Globulin, Total 3.0 1.5 - 4.5 g/dL   Albumin/Globulin Ratio 1.5 1.2 - 2.2   Bilirubin Total 0.4 0.0 - 1.2 mg/dL   Alkaline Phosphatase 76 44 - 121 IU/L   AST 14 0 - 40 IU/L   ALT 15 0 - 32 IU/L  Hemoglobin A1c  Result Value Ref Range   Hgb A1c MFr Bld 5.4 4.8 - 5.6 %   Est. average glucose Bld gHb Est-mCnc 108 mg/dL  Thyroid Panel With TSH  Result Value Ref Range   TSH 0.728 0.450 - 4.500 uIU/mL   T4, Total 5.5 4.5 - 12.0 ug/dL   T3 Uptake Ratio 27 24 - 39 %   Free Thyroxine Index 1.5 1.2 - 4.9  VITAMIN D 25 Hydroxy (Vit-D Deficiency, Fractures)  Result Value Ref Range   Vit D, 25-Hydroxy 20.5 (L) 30.0 - 100.0 ng/mL  Lipid panel  Result Value Ref Range   Cholesterol, Total 244 (H) 100 - 199 mg/dL   Triglycerides 136 0 - 149 mg/dL   HDL 87 >39 mg/dL   VLDL Cholesterol Cal 23 5 - 40 mg/dL   LDL Chol Calc (NIH) 134 (H) 0 - 99 mg/dL   Chol/HDL Ratio 2.8 0.0 - 4.4 ratio  QuantiFERON-TB Gold Plus  Result Value Ref Range   QuantiFERON Incubation Incubation performed.    QuantiFERON Criteria Comment  QuantiFERON TB1 Ag Value 0.04 IU/mL   QuantiFERON TB2 Ag Value 0.06 IU/mL   QuantiFERON Nil Value 0.04 IU/mL   QuantiFERON Mitogen Value >10.00 IU/mL   QuantiFERON-TB Gold Plus Negative Negative    MMUNIZATIONS:   - Tdap: Tetanus vaccination status reviewed: last tetanus booster within 10 years. - Influenza:  completed - Pneumovax: Not applicable - Prevnar: Not applicable - HPV: Not applicable - Zostavax vaccine: Not applicable  SCREENING COMPLETED: - Pap smear:  UTD - STI testing:Not Applicable -Mammogram:  UTD - Colonoscopy: { UTD - Bone Density: {Not Applicable -Hearing Test: Not  Applicable -Spirometry: Not Applicable     Assessment & Plan:   Problem List Items Addressed This Visit     LEFT SIDED ULCERATIVE COLITIS    Chronic.  She is currently managed by Dr. Hilarie Fredrickson with GI.  She has had a recent flare.  No alarm symptoms present at this time.  We will plan to follow closely.      Relevant Orders   B12 and Folate Panel (Completed)   CBC With Diff/Platelet (Completed)   Comprehensive metabolic panel (Completed)   Hemoglobin A1c (Completed)   Thyroid Panel With TSH (Completed)   VITAMIN D 25 Hydroxy (Vit-D Deficiency, Fractures) (Completed)   Lipid panel (Completed)   QuantiFERON-TB Gold Plus (Completed)   Mixed hyperlipidemia    Chronic.  She is working on [employer] [employer].  Labs pending.  No changes at this time.      Relevant Orders   B12 and Folate Panel (Completed)   CBC With Diff/Platelet (Completed)   Comprehensive metabolic panel (Completed)   Hemoglobin A1c (Completed)   Thyroid Panel With TSH (Completed)   VITAMIN D 25 Hydroxy (Vit-D Deficiency, Fractures) (Completed)   Lipid panel (Completed)   QuantiFERON-TB Gold Plus (Completed)   Immunocompromised (HCC)    Chronic due to medication use for ulcerative colitis.  No alarm symptoms present at this time.  Labs pending.      Relevant Orders   B12 and Folate Panel (Completed)   CBC With Diff/Platelet (Completed)   Comprehensive metabolic panel (Completed)   Hemoglobin A1c (Completed)   Thyroid Panel With TSH (Completed)   VITAMIN D 25 Hydroxy (Vit-D Deficiency, Fractures) (Completed)   Lipid panel (Completed)   QuantiFERON-TB Gold Plus (Completed)   Encounter for annual physical exam - Primary    CPE today with no abnormal findings on exam. Health maintenance reviewed.  Tetanus provided today. Anticipatory guidance provided. Labs pending. We will plan to follow-up in 1 year or sooner if needed.      Relevant Orders   B12 and Folate Panel (Completed)   CBC With Diff/Platelet  (Completed)   Comprehensive metabolic panel (Completed)   Hemoglobin A1c (Completed)   Thyroid Panel With TSH (Completed)   VITAMIN D 25 Hydroxy (Vit-D Deficiency, Fractures) (Completed)   Lipid panel (Completed)   QuantiFERON-TB Gold Plus (Completed)   Other Visit Diagnoses     High risk medication use       Relevant Orders   B12 and Folate Panel (Completed)   CBC With Diff/Platelet (Completed)   Comprehensive metabolic panel (Completed)   Hemoglobin A1c (Completed)   Thyroid Panel With TSH (Completed)   VITAMIN D 25 Hydroxy (Vit-D Deficiency, Fractures) (Completed)   Lipid panel (Completed)   QuantiFERON-TB Gold Plus (Completed)   Fear of flying       Relevant Medications   ALPRAZolam (XANAX) 0.25 MG tablet   Need for Tdap vaccination  Relevant Orders   Tdap vaccine greater than or equal to 7yo IM (Completed)   Vitamin D deficiency       Relevant Medications   Vitamin D, Ergocalciferol, (DRISDOL) 1.25 MG (50000 UNIT) CAPS capsule          Follow up plan: No follow-ups on file.  NEXT PREVENTATIVE PHYSICAL DUE IN 1 YEAR.  PATIENT COUNSELING PROVIDED FOR ALL ADULT PATIENTS:  Consume a well balanced diet low in saturated fats, cholesterol, and moderation in carbohydrates.   This can be as simple as monitoring portion sizes and cutting back on sugary beverages such as soda and juice to start with.    Daily water consumption of at least 64 ounces.  Physical activity at least 180 minutes per week, if just starting out.   This can be as simple as taking the stairs instead of the elevator and walking 2-3 laps around the office  purposefully every day.   STD protection, partner selection, and regular testing if high risk.  Limited consumption of alcoholic beverages if alcohol is consumed.  For women, I recommend no more than 7 alcoholic beverages per week, spread out throughout the week.  Avoid "binge" drinking or consuming large quantities of alcohol in one  setting.   Please let me know if you feel you may need help with reduction or quitting alcohol consumption.   Avoidance of nicotine, if used.  Please let me know if you feel you may need help with reduction or quitting nicotine use.   Daily mental health attention.  This can be in the form of 5 minute daily meditation, prayer, journaling, yoga, reflection, etc.   Purposeful attention to your emotions and mental state can significantly improve your overall wellbeing and Health.  Please know that I am here to help you with all of your health care goals and am happy to work with you to find a solution that works best for you.  The greatest advice I have received with any changes in life are to take it one step at a time, that even means if all you can focus on is the next 60 seconds, then do that and celebrate your victories.  With any changes in life, you will have set backs, and that is OK. The important thing to remember is, if you have a set back, it is not a failure, it is an opportunity to try again!  Health Maintenance Recommendations Screening Testing Mammogram Every 1 -2 years based on history and risk factors Starting at age 23 Pap Smear Ages 21-39 every 3 years Ages 73-65 every 5 years with HPV testing More frequent testing may be required based on results and history Colon Cancer Screening Every 1-10 years based on test performed, risk factors, and history Starting at age 42 Bone Density Screening Every 2-10 years based on history Starting at age 26 for women Recommendations for men differ based on medication usage, history, and risk factors AAA Screening One time ultrasound Men 73-38 years old who have every smoked Lung Cancer Screening Low Dose Lung CT every 12 months Age 16-80 years with a 30 pack-year smoking history who still smoke or who have quit within the last 15 years  Screening Labs Routine  Labs: Complete Blood Count (CBC), Complete Metabolic Panel (CMP),  Cholesterol (Lipid Panel) Every 6-12 months based on history and medications May be recommended more frequently based on current conditions or previous results Hemoglobin A1c Lab Every 3-12 months based on history and previous results  Starting at age 54 or earlier with diagnosis of diabetes, high cholesterol, BMI >26, and/or risk factors Frequent monitoring for patients with diabetes to ensure blood sugar control Thyroid Panel (TSH w/ T3 & T4) Every 6 months based on history, symptoms, and risk factors May be repeated more often if on medication HIV One time testing for all patients 8 and older May be repeated more frequently for patients with increased risk factors or exposure Hepatitis C One time testing for all patients 6 and older May be repeated more frequently for patients with increased risk factors or exposure Gonorrhea, Chlamydia Every 12 months for all sexually active persons 13-24 years Additional monitoring may be recommended for those who are considered high risk or who have symptoms PSA Men 76-38 years old with risk factors Additional screening may be recommended from age 54-69 based on risk factors, symptoms, and history  Vaccine Recommendations Tetanus Booster All adults every 10 years Flu Vaccine All patients 6 months and older every year COVID Vaccine All patients 12 years and older Initial dosing with booster May recommend additional booster based on age and health history HPV Vaccine 2 doses all patients age 87-26 Dosing may be considered for patients over 26 Shingles Vaccine (Shingrix) 2 doses all adults 4 years and older Pneumonia (Pneumovax 23) All adults 38 years and older May recommend earlier dosing based on health history Pneumonia (Prevnar 25) All adults 19 years and older Dosed 1 year after Pneumovax 23  Additional Screening, Testing, and Vaccinations may be recommended on an individualized basis based on family history, health history, risk  factors, and/or exposure.

## 2021-11-16 ENCOUNTER — Encounter (HOSPITAL_BASED_OUTPATIENT_CLINIC_OR_DEPARTMENT_OTHER): Payer: Self-pay | Admitting: Nurse Practitioner

## 2021-11-16 LAB — COMPREHENSIVE METABOLIC PANEL
ALT: 15 IU/L (ref 0–32)
AST: 14 IU/L (ref 0–40)
Albumin/Globulin Ratio: 1.5 (ref 1.2–2.2)
Albumin: 4.4 g/dL (ref 3.8–4.9)
Alkaline Phosphatase: 76 IU/L (ref 44–121)
BUN/Creatinine Ratio: 23 (ref 9–23)
BUN: 15 mg/dL (ref 6–24)
Bilirubin Total: 0.4 mg/dL (ref 0.0–1.2)
CO2: 25 mmol/L (ref 20–29)
Calcium: 9.4 mg/dL (ref 8.7–10.2)
Chloride: 101 mmol/L (ref 96–106)
Creatinine, Ser: 0.64 mg/dL (ref 0.57–1.00)
Globulin, Total: 3 g/dL (ref 1.5–4.5)
Glucose: 105 mg/dL — ABNORMAL HIGH (ref 70–99)
Potassium: 4 mmol/L (ref 3.5–5.2)
Sodium: 140 mmol/L (ref 134–144)
Total Protein: 7.4 g/dL (ref 6.0–8.5)
eGFR: 105 mL/min/{1.73_m2} (ref >59)

## 2021-11-16 LAB — CBC WITH DIFF/PLATELET
Basophils Absolute: 0 10*3/uL (ref 0.0–0.2)
Basos: 0 %
EOS (ABSOLUTE): 0 10*3/uL (ref 0.0–0.4)
Eos: 0 %
Hematocrit: 41.5 % (ref 34.0–46.6)
Hemoglobin: 13.8 g/dL (ref 11.1–15.9)
Immature Grans (Abs): 0.1 10*3/uL (ref 0.0–0.1)
Immature Granulocytes: 1 %
Lymphocytes Absolute: 1.4 10*3/uL (ref 0.7–3.1)
Lymphs: 13 %
MCH: 29.7 pg (ref 26.6–33.0)
MCHC: 33.3 g/dL (ref 31.5–35.7)
MCV: 89 fL (ref 79–97)
Monocytes Absolute: 0.4 10*3/uL (ref 0.1–0.9)
Monocytes: 4 %
Neutrophils Absolute: 8.4 10*3/uL — ABNORMAL HIGH (ref 1.4–7.0)
Neutrophils: 82 %
Platelets: 281 10*3/uL (ref 150–450)
RBC: 4.65 x10E6/uL (ref 3.77–5.28)
RDW: 12.6 % (ref 11.7–15.4)
WBC: 10.4 10*3/uL (ref 3.4–10.8)

## 2021-11-16 LAB — LIPID PANEL
Chol/HDL Ratio: 2.8 ratio (ref 0.0–4.4)
Cholesterol, Total: 244 mg/dL — ABNORMAL HIGH (ref 100–199)
HDL: 87 mg/dL (ref >39)
LDL Chol Calc (NIH): 134 mg/dL — ABNORMAL HIGH (ref 0–99)
Triglycerides: 136 mg/dL (ref 0–149)
VLDL Cholesterol Cal: 23 mg/dL (ref 5–40)

## 2021-11-16 LAB — VITAMIN D 25 HYDROXY (VIT D DEFICIENCY, FRACTURES): Vit D, 25-Hydroxy: 20.5 ng/mL — ABNORMAL LOW (ref 30.0–100.0)

## 2021-11-16 LAB — THYROID PANEL WITH TSH
Free Thyroxine Index: 1.5 (ref 1.2–4.9)
T3 Uptake Ratio: 27 % (ref 24–39)
T4, Total: 5.5 ug/dL (ref 4.5–12.0)
TSH: 0.728 u[IU]/mL (ref 0.450–4.500)

## 2021-11-16 LAB — QUANTIFERON-TB GOLD PLUS
QuantiFERON Mitogen Value: >10 IU/mL
QuantiFERON Nil Value: 0.04 IU/mL
QuantiFERON TB1 Ag Value: 0.04 IU/mL
QuantiFERON TB2 Ag Value: 0.06 IU/mL
QuantiFERON-TB Gold Plus: NEGATIVE

## 2021-11-16 LAB — B12 AND FOLATE PANEL
Folate: 8.5 ng/mL (ref >3.0)
Vitamin B-12: 307 pg/mL (ref 232–1245)

## 2021-11-16 LAB — HEMOGLOBIN A1C
Est. average glucose Bld gHb Est-mCnc: 108 mg/dL
Hgb A1c MFr Bld: 5.4 % (ref 4.8–5.6)

## 2021-11-21 MED ORDER — VITAMIN D (ERGOCALCIFEROL) 1.25 MG (50000 UNIT) PO CAPS
50000.0000 [IU] | ORAL_CAPSULE | ORAL | 0 refills | Status: DC
  Filled 2021-11-21: qty 12, 84d supply, fill #0

## 2021-11-21 NOTE — Telephone Encounter (Signed)
Provider has not acknowledge labs at this time. Recommendations required first  @CMA - communicate with patient

## 2021-11-22 ENCOUNTER — Other Ambulatory Visit (HOSPITAL_COMMUNITY): Payer: Self-pay

## 2021-11-22 ENCOUNTER — Encounter (HOSPITAL_BASED_OUTPATIENT_CLINIC_OR_DEPARTMENT_OTHER): Payer: Self-pay | Admitting: Nurse Practitioner

## 2021-11-23 LAB — SERIAL MONITORING

## 2021-11-24 ENCOUNTER — Other Ambulatory Visit (HOSPITAL_BASED_OUTPATIENT_CLINIC_OR_DEPARTMENT_OTHER): Payer: Self-pay | Admitting: Nurse Practitioner

## 2021-11-24 DIAGNOSIS — E538 Deficiency of other specified B group vitamins: Secondary | ICD-10-CM

## 2021-11-24 MED ORDER — CYANOCOBALAMIN 1000 MCG/ML IJ SOLN
1000.0000 ug | INTRAMUSCULAR | 1 refills | Status: DC
  Filled 2021-11-24: qty 3, 90d supply, fill #0
  Filled 2022-01-23 – 2022-02-11 (×3): qty 3, 90d supply, fill #1

## 2021-11-25 ENCOUNTER — Other Ambulatory Visit (HOSPITAL_COMMUNITY): Payer: Self-pay

## 2021-11-28 LAB — GOLIMUMAB AND ANTI-GOL AB
Anti-Golimumab Antibody: <20 ng/mL
Golimumab: 0.7 ug/mL

## 2021-12-01 ENCOUNTER — Ambulatory Visit: Payer: 59 | Attending: Nurse Practitioner | Admitting: Pharmacist

## 2021-12-01 ENCOUNTER — Telehealth: Payer: Self-pay

## 2021-12-01 ENCOUNTER — Other Ambulatory Visit (HOSPITAL_COMMUNITY): Payer: Self-pay

## 2021-12-01 ENCOUNTER — Other Ambulatory Visit: Payer: Self-pay

## 2021-12-01 DIAGNOSIS — Z79899 Other long term (current) drug therapy: Secondary | ICD-10-CM

## 2021-12-01 MED ORDER — RINVOQ 30 MG PO TB24
30.0000 mg | ORAL_TABLET | Freq: Every day | ORAL | 11 refills | Status: DC
  Filled 2021-12-01: qty 30, fill #0
  Filled 2022-01-27: qty 30, 30d supply, fill #0
  Filled 2022-02-20: qty 30, 30d supply, fill #1
  Filled 2022-03-27: qty 30, 30d supply, fill #2
  Filled 2022-04-22: qty 30, 30d supply, fill #3
  Filled 2022-05-21: qty 30, 30d supply, fill #4
  Filled 2022-06-18: qty 30, 30d supply, fill #5

## 2021-12-01 MED ORDER — RINVOQ 30 MG PO TB24
30.0000 mg | ORAL_TABLET | Freq: Every day | ORAL | 11 refills | Status: DC
  Filled 2021-12-01: qty 30, fill #0

## 2021-12-01 MED ORDER — RINVOQ 45 MG PO TB24
ORAL_TABLET | ORAL | 0 refills | Status: DC
  Filled 2021-12-01: qty 56, fill #0

## 2021-12-01 MED ORDER — RINVOQ 45 MG PO TB24
ORAL_TABLET | ORAL | 0 refills | Status: DC
  Filled 2021-12-01: qty 56, fill #0
  Filled 2021-12-09: qty 56, 28d supply, fill #0
  Filled 2021-12-09: qty 28, 28d supply, fill #0
  Filled 2021-12-09: qty 56, 28d supply, fill #0
  Filled 2021-12-09: qty 28, 28d supply, fill #0
  Filled 2021-12-26: qty 28, 28d supply, fill #1

## 2021-12-01 NOTE — Telephone Encounter (Signed)
Submitted a Prior Authorization request to [facility] for RINVOQ 33m via CoverMyMeds. Will update once we receive a response.   (Key:: [id number]

## 2021-12-01 NOTE — Progress Notes (Signed)
  S: Patient presents today for review of their specialty medication.   Patient is currently taking Rinvoq for rheumatoid arthritis. Patient is managed by Dr. Hilarie Fredrickson for this.   Dosing: Adult  Note: May be used as monotherapy or in combination with methotrexate or other nonbiologic disease-modifying antirheumatic drugs (DMARDs); use in combination with biologic DMARDS or potent immunosuppressants (eg, azathioprine, cyclosporine) is not recommended. Do not initiate therapy in patients with an absolute lymphocyte count <500/mm3, ANC <1,000/mm3, or hemoglobin <8 g/dL. Rheumatoid arthritis: Oral: 15 mg once daily.  Adherence: has not yet started    Efficacy: has not yet started. Most recently, she was on Simponi. After tx of URI, she developed a flare that required tx with steroids. Now, the plan is to switch to Rinvoq.   Monitoring: S/sx thromboembolism: none, no prior hx of VTE.  S/sx malignancy: none  S/sx of infection: none   Current adverse effects: none   O:     Lab Results  Component Value Date   WBC 10.4 11/12/2021   HGB 13.8 11/12/2021   HCT 41.5 11/12/2021   MCV 89 11/12/2021   PLT 281 11/12/2021      Chemistry      Component Value Date/Time   NA 140 11/12/2021 1313   K 4.0 11/12/2021 1313   CL 101 11/12/2021 1313   CO2 25 11/12/2021 1313   BUN 15 11/12/2021 1313   CREATININE 0.64 11/12/2021 1313   CREATININE 0.62 03/22/2020 1040      Component Value Date/Time   CALCIUM 9.4 11/12/2021 1313   ALKPHOS 76 11/12/2021 1313   AST 14 11/12/2021 1313   ALT 15 11/12/2021 1313   BILITOT 0.4 11/12/2021 1313       Lab Results  Component Value Date   CHOL 244 (H) 11/12/2021   HDL 87 11/12/2021   LDLCALC 134 (H) 11/12/2021   LDLDIRECT 142.8 03/03/2010   TRIG 136 11/12/2021   CHOLHDL 2.8 11/12/2021     A/P: 1. Medication review: patient currently prescribed Rinvoq for rheumatoid arthritis. Reviewed the medication with the patient, including the following:  Rinvoq is a medication used to treat rheumatoid arthritis. Administer with or without food. Swallow tablet whole; do not crush, split, or chew. Possible adverse effects include increased risk of infection, GI upset, hematologic toxicity, hepatic effects, lipid abnormalities, increased risk of malignancy, thromboembolism. Avoid live vaccinations. No recommendations for any changes.  Benard Halsted, PharmD, [facility] [place], [place] [phone]

## 2021-12-01 NOTE — Telephone Encounter (Signed)
Monchell we sent in 2 prescriptions for rinvoq, the starter for 8 weeks and then the maintenance script. These will need PA's.

## 2021-12-03 ENCOUNTER — Other Ambulatory Visit (HOSPITAL_COMMUNITY): Payer: Self-pay

## 2021-12-04 ENCOUNTER — Other Ambulatory Visit (HOSPITAL_COMMUNITY): Payer: Self-pay

## 2021-12-04 NOTE — Telephone Encounter (Signed)
Patient Advocate Encounter  Prior Authorization for Rinvoq 45MG has been approved.   Effective: 12/03/2021 to 01/28/2022  Test billing for this medication returns a $250 copay for 30 days.  Test claim for 30MG tablets still returns PA rejection.  Determination letter has been added to patient chart.  Clista Bernhardt, CPhT Rx Patient Advocate Phone: [phone]

## 2021-12-04 NOTE — Telephone Encounter (Signed)
Patient Advocate Encounter  Prior Authorization for [name] has been approved.    PA# [id number] [facility] 30MG) PA# [id number] (RINVOQ 15MG) Effective dates: 11.13.23 through 03.12.24  [name] B. CPhT P: [phone] F: [phone]

## 2021-12-08 ENCOUNTER — Other Ambulatory Visit (HOSPITAL_COMMUNITY): Payer: Self-pay

## 2021-12-09 ENCOUNTER — Other Ambulatory Visit (HOSPITAL_COMMUNITY): Payer: Self-pay

## 2021-12-10 ENCOUNTER — Other Ambulatory Visit (HOSPITAL_COMMUNITY): Payer: Self-pay

## 2021-12-10 NOTE — Assessment & Plan Note (Signed)
Chronic.  She is currently managed by Dr. Hilarie Fredrickson with GI.  She has had a recent flare.  No alarm symptoms present at this time.  We will plan to follow closely.

## 2021-12-10 NOTE — Assessment & Plan Note (Signed)
Chronic.  She is working on [employer].  Labs pending.  No changes at this time.

## 2021-12-10 NOTE — Assessment & Plan Note (Signed)
Chronic due to medication use for ulcerative colitis.  No alarm symptoms present at this time.  Labs pending.

## 2021-12-10 NOTE — Assessment & Plan Note (Signed)
CPE today with no abnormal findings on exam. Health maintenance reviewed.  Tetanus provided today. Anticipatory guidance provided. Labs pending. We will plan to follow-up in 1 year or sooner if needed.

## 2021-12-23 ENCOUNTER — Other Ambulatory Visit: Payer: Self-pay | Admitting: Obstetrics and Gynecology

## 2021-12-23 DIAGNOSIS — Z1231 Encounter for screening mammogram for malignant neoplasm of breast: Secondary | ICD-10-CM

## 2021-12-25 ENCOUNTER — Other Ambulatory Visit (HOSPITAL_COMMUNITY): Payer: Self-pay

## 2021-12-26 ENCOUNTER — Other Ambulatory Visit (HOSPITAL_COMMUNITY): Payer: Self-pay

## 2021-12-31 ENCOUNTER — Ambulatory Visit: Payer: 59 | Admitting: Internal Medicine

## 2022-01-01 ENCOUNTER — Other Ambulatory Visit (HOSPITAL_COMMUNITY): Payer: Self-pay

## 2022-01-14 ENCOUNTER — Ambulatory Visit: Payer: 59 | Admitting: Internal Medicine

## 2022-01-22 ENCOUNTER — Other Ambulatory Visit: Payer: Self-pay | Admitting: Internal Medicine

## 2022-01-23 ENCOUNTER — Other Ambulatory Visit (HOSPITAL_COMMUNITY): Payer: Self-pay

## 2022-01-23 MED ORDER — LIALDA 1.2 G PO TBEC
2.4000 g | DELAYED_RELEASE_TABLET | Freq: Every day | ORAL | 0 refills | Status: DC
  Filled 2022-01-23 – 2022-02-23 (×3): qty 180, 90d supply, fill #0

## 2022-01-27 ENCOUNTER — Other Ambulatory Visit: Payer: Self-pay | Admitting: Internal Medicine

## 2022-01-27 ENCOUNTER — Other Ambulatory Visit (HOSPITAL_COMMUNITY): Payer: Self-pay

## 2022-01-28 ENCOUNTER — Other Ambulatory Visit (HOSPITAL_COMMUNITY): Payer: Self-pay

## 2022-02-09 ENCOUNTER — Ambulatory Visit
Admission: RE | Admit: 2022-02-09 | Discharge: 2022-02-09 | Disposition: A | Discharge status: Home / Self Care | Payer: 59 | Source: Ambulatory Visit | Attending: Obstetrics and Gynecology | Admitting: Obstetrics and Gynecology

## 2022-02-09 DIAGNOSIS — Z1231 Encounter for screening mammogram for malignant neoplasm of breast: Secondary | ICD-10-CM

## 2022-02-11 ENCOUNTER — Other Ambulatory Visit (HOSPITAL_COMMUNITY): Payer: Self-pay

## 2022-02-11 DIAGNOSIS — Z08 Encounter for follow-up examination after completed treatment for malignant neoplasm: Secondary | ICD-10-CM | POA: Diagnosis not present

## 2022-02-11 DIAGNOSIS — L309 Dermatitis, unspecified: Secondary | ICD-10-CM | POA: Diagnosis not present

## 2022-02-11 DIAGNOSIS — L814 Other melanin hyperpigmentation: Secondary | ICD-10-CM | POA: Diagnosis not present

## 2022-02-11 DIAGNOSIS — Z85828 Personal history of other malignant neoplasm of skin: Secondary | ICD-10-CM | POA: Diagnosis not present

## 2022-02-11 DIAGNOSIS — L821 Other seborrheic keratosis: Secondary | ICD-10-CM | POA: Diagnosis not present

## 2022-02-11 MED ORDER — TRIAMCINOLONE ACETONIDE 0.1 % EX CREA
TOPICAL_CREAM | CUTANEOUS | 2 refills | Status: DC
  Filled 2022-02-11: qty 45, 30d supply, fill #0

## 2022-02-19 ENCOUNTER — Other Ambulatory Visit (HOSPITAL_COMMUNITY): Payer: Self-pay

## 2022-02-20 ENCOUNTER — Other Ambulatory Visit (HOSPITAL_COMMUNITY): Payer: Self-pay

## 2022-02-23 ENCOUNTER — Other Ambulatory Visit (HOSPITAL_COMMUNITY): Payer: Self-pay

## 2022-02-25 ENCOUNTER — Other Ambulatory Visit (HOSPITAL_BASED_OUTPATIENT_CLINIC_OR_DEPARTMENT_OTHER): Payer: Self-pay | Admitting: Nurse Practitioner

## 2022-02-25 ENCOUNTER — Other Ambulatory Visit: Payer: Self-pay

## 2022-02-25 ENCOUNTER — Other Ambulatory Visit (HOSPITAL_COMMUNITY): Payer: Self-pay

## 2022-02-25 MED ORDER — SERTRALINE HCL 50 MG PO TABS
50.0000 mg | ORAL_TABLET | Freq: Every day | ORAL | 3 refills | Status: DC
  Filled 2022-02-25: qty 90, 90d supply, fill #0
  Filled 2022-05-21: qty 90, 90d supply, fill #1
  Filled 2022-08-21: qty 90, 90d supply, fill #2
  Filled 2022-11-23: qty 90, 90d supply, fill #3

## 2022-03-05 DIAGNOSIS — K5190 Ulcerative colitis, unspecified, without complications: Secondary | ICD-10-CM | POA: Diagnosis not present

## 2022-03-05 DIAGNOSIS — N951 Menopausal and female climacteric states: Secondary | ICD-10-CM | POA: Diagnosis not present

## 2022-03-05 DIAGNOSIS — Z01419 Encounter for gynecological examination (general) (routine) without abnormal findings: Secondary | ICD-10-CM | POA: Diagnosis not present

## 2022-03-05 DIAGNOSIS — N9419 Other specified dyspareunia: Secondary | ICD-10-CM | POA: Diagnosis not present

## 2022-03-05 DIAGNOSIS — R232 Flushing: Secondary | ICD-10-CM | POA: Diagnosis not present

## 2022-03-05 DIAGNOSIS — N943 Premenstrual tension syndrome: Secondary | ICD-10-CM | POA: Diagnosis not present

## 2022-03-10 ENCOUNTER — Other Ambulatory Visit (HOSPITAL_COMMUNITY): Payer: Self-pay

## 2022-03-10 MED ORDER — VENLAFAXINE HCL ER 37.5 MG PO CP24
37.5000 mg | ORAL_CAPSULE | Freq: Every day | ORAL | 11 refills | Status: DC
  Filled 2022-03-10: qty 30, 30d supply, fill #0

## 2022-03-11 ENCOUNTER — Other Ambulatory Visit (HOSPITAL_COMMUNITY): Payer: Self-pay

## 2022-03-12 ENCOUNTER — Other Ambulatory Visit (HOSPITAL_COMMUNITY): Payer: Self-pay

## 2022-03-17 ENCOUNTER — Other Ambulatory Visit (HOSPITAL_COMMUNITY): Payer: Self-pay

## 2022-03-19 ENCOUNTER — Other Ambulatory Visit (HOSPITAL_COMMUNITY): Payer: Self-pay

## 2022-03-23 ENCOUNTER — Other Ambulatory Visit (HOSPITAL_COMMUNITY): Payer: Self-pay

## 2022-03-27 ENCOUNTER — Other Ambulatory Visit (HOSPITAL_COMMUNITY): Payer: Self-pay

## 2022-04-01 ENCOUNTER — Other Ambulatory Visit (HOSPITAL_COMMUNITY): Payer: Self-pay

## 2022-04-07 ENCOUNTER — Other Ambulatory Visit (INDEPENDENT_AMBULATORY_CARE_PROVIDER_SITE_OTHER): Payer: 59

## 2022-04-07 ENCOUNTER — Ambulatory Visit: Payer: 59 | Admitting: Internal Medicine

## 2022-04-07 ENCOUNTER — Encounter: Payer: Self-pay | Admitting: Internal Medicine

## 2022-04-07 VITALS — BP 122/82 | HR 62 | Ht 64.0 in | Wt 161.0 lb

## 2022-04-07 DIAGNOSIS — Z872 Personal history of diseases of the skin and subcutaneous tissue: Secondary | ICD-10-CM

## 2022-04-07 DIAGNOSIS — M0760 Enteropathic arthropathies, unspecified site: Secondary | ICD-10-CM

## 2022-04-07 DIAGNOSIS — K639 Disease of intestine, unspecified: Secondary | ICD-10-CM

## 2022-04-07 DIAGNOSIS — K5150 Left sided colitis without complications: Secondary | ICD-10-CM

## 2022-04-07 LAB — LIPID PANEL
Cholesterol: 240 mg/dL — ABNORMAL HIGH (ref 0–200)
HDL: 60.5 mg/dL (ref >39.00)
LDL Cholesterol: 167 mg/dL — ABNORMAL HIGH (ref 0–99)
NonHDL: 179.39
Total CHOL/HDL Ratio: 4
Triglycerides: 63 mg/dL (ref 0.0–149.0)
VLDL: 12.6 mg/dL (ref 0.0–40.0)

## 2022-04-07 LAB — CBC WITH DIFFERENTIAL/PLATELET
Basophils Absolute: 0 10*3/uL (ref 0.0–0.1)
Basophils Relative: 0.8 % (ref 0.0–3.0)
Eosinophils Absolute: 0.1 10*3/uL (ref 0.0–0.7)
Eosinophils Relative: 2.9 % (ref 0.0–5.0)
HCT: 35.7 % — ABNORMAL LOW (ref 36.0–46.0)
Hemoglobin: 12.3 g/dL (ref 12.0–15.0)
Lymphocytes Relative: 34.5 % (ref 12.0–46.0)
Lymphs Abs: 1.5 10*3/uL (ref 0.7–4.0)
MCHC: 34.3 g/dL (ref 30.0–36.0)
MCV: 91.8 fl (ref 78.0–100.0)
Monocytes Absolute: 0.7 10*3/uL (ref 0.1–1.0)
Monocytes Relative: 16.5 % — ABNORMAL HIGH (ref 3.0–12.0)
Neutro Abs: 1.9 10*3/uL (ref 1.4–7.7)
Neutrophils Relative %: 45.3 % (ref 43.0–77.0)
Platelets: 323 10*3/uL (ref 150.0–400.0)
RBC: 3.89 Mil/uL (ref 3.87–5.11)
RDW: 14.7 % (ref 11.5–15.5)
WBC: 4.3 10*3/uL (ref 4.0–10.5)

## 2022-04-07 LAB — COMPREHENSIVE METABOLIC PANEL
ALT: 20 U/L (ref 0–35)
AST: 24 U/L (ref 0–37)
Albumin: 4.5 g/dL (ref 3.5–5.2)
Alkaline Phosphatase: 55 U/L (ref 39–117)
BUN: 25 mg/dL — ABNORMAL HIGH (ref 6–23)
CO2: 26 mEq/L (ref 19–32)
Calcium: 9.2 mg/dL (ref 8.4–10.5)
Chloride: 103 mEq/L (ref 96–112)
Creatinine, Ser: 0.74 mg/dL (ref 0.40–1.20)
GFR: 91.47 mL/min (ref >60.00)
Glucose, Bld: 83 mg/dL (ref 70–99)
Potassium: 4 mEq/L (ref 3.5–5.1)
Sodium: 139 mEq/L (ref 135–145)
Total Bilirubin: 0.5 mg/dL (ref 0.2–1.2)
Total Protein: 7.5 g/dL (ref 6.0–8.3)

## 2022-04-07 NOTE — Progress Notes (Signed)
Subjective:    Patient ID: [name], female    DOB: [birth year], 57 y.o.   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  She is here alone today and was last seen for surveillance colonoscopy in October 2022.  At the time of last colonoscopy she was in clinical and endoscopic remission on Simponi.  However, in the summer 2023 after being treated for sinusitis with azithromycin and levofloxacin she developed a flare of her ulcerative colitis.  Monitor abdominal soreness, diarrhea with blood in stool.  Budesonide did not help and we switched her to prednisone.  Simponi levels were quite low without antibodies and she was switched to Rinvoq.  She did the Rinvoq induction at 45 mg and is now taking 30 mg daily.  She feels that she is back to "complete remission".  1 formed bowel movement daily.  No blood in stool, mucus in stool, abdominal pain or diarrhea.  She did have about a month of hand pain in her joints after stopping Simponi but by month 2 of Rinvoq this have resolved again.  She feels very well.  She follows with dermatology.  She is also continued Lialda 2.4 g daily.  No new rashes.  She continues to work for [employer] which she enjoys.  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  Review of Systems As per HPI, otherwise negative  Current Medications, Allergies, Past Medical History, Past Surgical History, Family History and Social History were reviewed in [employer] record.    Objective:   Physical Exam BP 122/82   Pulse 62   Ht '5\' 4"'$  (1.626 m)   Wt 161 lb (73 kg)   BMI 27.64 kg/m  Gen: awake, alert, NAD HEENT: anicteric, op clear Abd: soft, NT/ND, +BS throughout Ext: no c/c/e Neuro: nonfocal     Latest Ref Rng & Units 11/12/2021    1:13 PM 01/03/2021    11:28 AM 08/04/2019    9:14 AM  CBC  WBC 3.4 - 10.8 x10E3/uL 10.4  6.4  4.9   Hemoglobin 11.1 - 15.9 g/dL 13.8  14.1  14.6   Hematocrit 34.0 - 46.6 % 41.5  41.3  44.1   Platelets 150 - 450 x10E3/uL 281   279    Lipid Panel     Component Value Date/Time   CHOL 244 (H) 11/12/2021 1313   TRIG 136 11/12/2021 1313   HDL 87 11/12/2021 1313   CHOLHDL 2.8 11/12/2021 1313   CHOLHDL 4.6 03/22/2020 1040   VLDL 20.0 03/03/2010 1248   LDLCALC 134 (H) 11/12/2021 1313   LDLCALC 166 (H) 03/22/2020 1040   LDLDIRECT 142.8 03/03/2010 1248   LABVLDL 23 11/12/2021 1313   CMP     Component Value Date/Time   NA 140 11/12/2021 1313   K 4.0 11/12/2021 1313   CL 101 11/12/2021 1313   CO2 25 11/12/2021 1313   GLUCOSE 105 (H) 11/12/2021 1313   GLUCOSE 91 03/22/2020 1040   BUN 15 11/12/2021 1313   CREATININE 0.64 11/12/2021 1313   CREATININE 0.62 03/22/2020 1040   CALCIUM 9.4 11/12/2021 1313   PROT 7.4 11/12/2021 1313   ALBUMIN 4.4 11/12/2021 1313   AST 14 11/12/2021 1313   ALT 15 11/12/2021 1313   ALKPHOS 76 11/12/2021 1313   BILITOT 0.4 11/12/2021  [place] 09/02/2017 0925   GFRAA 104 09/02/2017 0925         Assessment & Plan:  57 year old female with a history of chronic longstanding ulcerative colitis (dx 30 yrs ago), history of pyoderma gangrenosum, 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   Left-sided ulcerative colitis, longstanding (dx 1990s) --she had a flare of her disease after antibiotics this summer and we noted that her simply levels were quite low.  She has been changed to Rinvoq and is doing very well clinically.  Plan as follows: -- Check CBC, CMP, repeat lipid panel now that Rinvoq has been started -- Check fecal calprotectin to assess disease activity; if normal we can consider stopping Lialda to see if symptoms recur and if they do not I would repeat fecal calprotectin to make sure no low-grade inflammation on 5-ASA -- Continue Rinvoq 30 mg  daily -- Continue Lialda 2.4 g daily for now -- Continue routine annual follow-up with dermatology -- 64-monthfollow-up with me -- Surveillance colonoscopy later this year  2.  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  History of pyoderma gangrenosum --resolved with IBD treatment.  She follows with dermatology.

## 2022-04-07 NOTE — Patient Instructions (Addendum)
Your provider has requested that you go to the basement level for lab work before leaving today. Press "B" on the elevator. The lab is located at the first door on the left as you exit the elevator.   Continue Rinvoq 30 mg daily  Continue Lialda 2.4 mg daily  Follow up in 6 months  Due to recent changes in healthcare laws, you may see the results of your imaging and laboratory studies on MyChart before your provider has had a chance to review them.  We understand that in some cases there may be results that are confusing or concerning to you. Not all laboratory results come back in the same time frame and the provider may be waiting for multiple results in order to interpret others.  Please give [place] 48 hours in order for your provider to thoroughly review all the results before contacting the office for clarification of your results.    _______________________________________________________  If your blood pressure at your visit was 140/90 or greater, please contact your primary care physician to follow up on this.  _______________________________________________________  If you are age 72 or older, your body mass index should be between 23-30. Your Body mass index is 27.64 kg/m. If this is out of the aforementioned range listed, please consider follow up with your Primary Care Provider.  If you are age 23 or younger, your body mass index should be between 19-25. Your Body mass index is 27.64 kg/m. If this is out of the aformentioned range listed, please consider follow up with your Primary Care Provider.   ________________________________________________________  The [facility] providers would like to encourage you to use [facility] to communicate with providers for non-urgent requests or questions.  Due to long hold times on the telephone, sending your provider a message by [facility] may be a faster and more efficient way to get a response.  Please allow 48 business hours for a response.  Please  remember that this is for non-urgent requests.  _______________________________________________________   Thank you for choosing [facility]  Ulice Dash Pyrtle,MD

## 2022-04-07 NOTE — Addendum Note (Signed)
Addended by: Susy Manor on: 10/02/9196 08:52 AM   Modules accepted: Orders

## 2022-04-08 ENCOUNTER — Other Ambulatory Visit: Payer: 59

## 2022-04-08 DIAGNOSIS — K5150 Left sided colitis without complications: Secondary | ICD-10-CM | POA: Diagnosis not present

## 2022-04-13 LAB — CALPROTECTIN, FECAL: Calprotectin, Fecal: 55 ug/g (ref 0–120)

## 2022-04-22 ENCOUNTER — Other Ambulatory Visit (HOSPITAL_COMMUNITY): Payer: Self-pay

## 2022-04-22 ENCOUNTER — Other Ambulatory Visit: Payer: Self-pay

## 2022-04-30 ENCOUNTER — Other Ambulatory Visit: Payer: Self-pay

## 2022-05-20 ENCOUNTER — Other Ambulatory Visit (HOSPITAL_COMMUNITY): Payer: Self-pay

## 2022-05-22 ENCOUNTER — Other Ambulatory Visit: Payer: Self-pay

## 2022-05-22 ENCOUNTER — Other Ambulatory Visit (HOSPITAL_COMMUNITY): Payer: Self-pay

## 2022-05-25 ENCOUNTER — Other Ambulatory Visit: Payer: Self-pay

## 2022-05-25 ENCOUNTER — Other Ambulatory Visit (HOSPITAL_COMMUNITY): Payer: Self-pay

## 2022-06-11 ENCOUNTER — Other Ambulatory Visit (HOSPITAL_COMMUNITY): Payer: Self-pay

## 2022-06-15 ENCOUNTER — Ambulatory Visit (INDEPENDENT_AMBULATORY_CARE_PROVIDER_SITE_OTHER): Payer: 59 | Admitting: Nurse Practitioner

## 2022-06-15 ENCOUNTER — Other Ambulatory Visit (HOSPITAL_COMMUNITY): Payer: Self-pay

## 2022-06-15 VITALS — BP 130/80 | HR 63 | Wt 169.2 lb

## 2022-06-15 DIAGNOSIS — E538 Deficiency of other specified B group vitamins: Secondary | ICD-10-CM

## 2022-06-15 DIAGNOSIS — E663 Overweight: Secondary | ICD-10-CM | POA: Diagnosis not present

## 2022-06-15 DIAGNOSIS — E782 Mixed hyperlipidemia: Secondary | ICD-10-CM

## 2022-06-15 MED ORDER — PHENTERMINE HCL 37.5 MG PO TABS
37.5000 mg | ORAL_TABLET | Freq: Every day | ORAL | 1 refills | Status: DC
  Filled 2022-06-15: qty 90, 90d supply, fill #0

## 2022-06-15 MED ORDER — ROSUVASTATIN CALCIUM 5 MG PO TABS
5.0000 mg | ORAL_TABLET | Freq: Every day | ORAL | 3 refills | Status: DC
  Filled 2022-06-15: qty 90, 90d supply, fill #0
  Filled 2022-09-11: qty 90, 90d supply, fill #1
  Filled 2022-12-23: qty 90, 90d supply, fill #2
  Filled 2023-04-18: qty 90, 90d supply, fill #3

## 2022-06-15 MED ORDER — CYANOCOBALAMIN 1000 MCG/ML IJ SOLN
1000.0000 ug | INTRAMUSCULAR | 3 refills | Status: DC
  Filled 2022-06-15: qty 3, 90d supply, fill #0
  Filled 2022-08-21 – 2022-09-11 (×2): qty 3, 90d supply, fill #1
  Filled 2022-12-03: qty 3, 90d supply, fill #2
  Filled 2023-04-18: qty 3, 90d supply, fill #3

## 2022-06-15 NOTE — Progress Notes (Unsigned)
[name], DNP, AGNP-c [facility]  [address] [place], [place] [address] [phone]  ESTABLISHED PATIENT- Chronic Health and/or Follow-Up Visit  Blood pressure 130/80, pulse 63, weight 169 lb 3.2 oz (76.7 kg).    [name] is a 57 y.o. year old female presenting today for evaluation and management of the following:  [name] presents today with concerns regarding the management of her cholesterol.  She has recently had a successful switch from Simponi to Rinvoq for management of her ulcerative colitis and this has been a very successful change for her.  However, she notes a side effect of increased cholesterol levels, confirmed by recent lab results.  She is very pleased with the results of Rinvoq and would like to continue on the medication, but was advised by Dr. Hilarie Fredrickson (GI) that she should discuss cholesterol management with her PCP.  Currently, she is in remission from her ulcerative colitis and is scheduled for colonoscopy in October.  [name] also expresses concerns about her weight, which is at its highest it has ever been.  She is seeking support for weight management.  Previous attempts with Saxenda were discontinued due to concerns with GI side effects in the setting of UC.  She is actively participating in a weight training class 3 times a week and is made dietary changes to include 2 plant-based days per week with very close diet monitoring on the other days, yet she has not had any success in weight management.  She is interested in something that may help boost her metabolism to see if this can kick start her weight loss while she continues with her lifestyle changes.  [name] has a history of B12 deficiency would benefit from B12 injections, reporting improved energy levels.  She is requesting a refill for these injections.  All ROS negative with exception of what is listed above.   PHYSICAL EXAM Physical Exam Vitals and nursing note reviewed.   Constitutional:      Appearance: Normal appearance.  HENT:     Head: Normocephalic.  Eyes:     Pupils: Pupils are equal, round, and reactive to light.  Cardiovascular:     Rate and Rhythm: Normal rate and regular rhythm.     Pulses: Normal pulses.     Heart sounds: Normal heart sounds.  Pulmonary:     Effort: Pulmonary effort is normal.     Breath sounds: Normal breath sounds.  Musculoskeletal:        General: Normal range of motion.     Cervical back: Normal range of motion.  Skin:    General: Skin is warm and dry.     Capillary Refill: Capillary refill takes less than 2 seconds.  Neurological:     General: No focal deficit present.     Mental Status: She is alert.  Psychiatric:        Mood and Affect: Mood normal.     PLAN Problem List Items Addressed This Visit     Overweight (BMI 25.0-29.9)    [name] expresses challenges with weight loss, potentially linked to menopausal changes and medication side effects.  She has maintained motivation with diet and exercise for improvement of her health for the past several months without any significant success with weight loss.  We discussed her efforts today and medication management options that may be helpful.  Considering she has excellent diet and exercise regimens in place I do feel that she would be successful with the use of medications such as phentermine  to help boost her metabolism. Plan: -I have sent phentermine 37.5 mg to the pharmacy. -Continue with your regular diet and exercise plan. -Please notify me immediately if you have any negative side effects or symptoms.      Relevant Medications   phentermine (ADIPEX-P) 37.5 MG tablet   Other Relevant Orders   CT CARDIAC SCORING (SELF PAY ONLY)   Mixed hyperlipidemia - Primary    [name] has a noted elevation in cholesterol levels, which may be associated with the use of Rinvoq for ulcerative colitis.  Overall her condition is stable at this time. Plan: -Initiate  rosuvastatin at a dosage of 5 mg starting with every other day. -Follow-up with Dr. Hilarie Fredrickson as scheduled in June for repeat labs.  If cholesterol levels are not well-managed we can always consider increasing the dosage. -If you note any side effects or concerning symptoms please do not hesitate to reach out.      Relevant Medications   rosuvastatin (CRESTOR) 5 MG tablet   phentermine (ADIPEX-P) 37.5 MG tablet   Other Relevant Orders   CT CARDIAC SCORING (SELF PAY ONLY)   B12 deficiency    [name] has a history of B12 deficiency and has previously responded well to B12 injections.  With the history of ulcerative colitis it is most likely that she is unable to appropriately absorb this vitamin orally. Plan: -Prescription for B12 injections has been renewed.  Will plan to monitor labs at follow-up visit.      Relevant Medications   cyanocobalamin (VITAMIN B12) 1000 MCG/ML injection    No follow-ups on file.   [name], DNP, AGNP-c 06/15/2022 11:52 AM

## 2022-06-16 ENCOUNTER — Encounter: Payer: Self-pay | Admitting: Nurse Practitioner

## 2022-06-16 DIAGNOSIS — E538 Deficiency of other specified B group vitamins: Secondary | ICD-10-CM | POA: Insufficient documentation

## 2022-06-16 NOTE — Assessment & Plan Note (Signed)
[name] expresses challenges with weight loss, potentially linked to menopausal changes and medication side effects.  She has maintained motivation with diet and exercise for improvement of her health for the past several months without any significant success with weight loss.  We discussed her efforts today and medication management options that may be helpful.  Considering she has excellent diet and exercise regimens in place I do feel that she would be successful with the use of medications such as phentermine to help boost her metabolism. Plan: -I have sent phentermine 37.5 mg to the pharmacy. -Continue with your regular diet and exercise plan. -Please notify me immediately if you have any negative side effects or symptoms.

## 2022-06-16 NOTE — Assessment & Plan Note (Signed)
[name] has a noted elevation in cholesterol levels, which may be associated with the use of Rinvoq for ulcerative colitis.  Overall her condition is stable at this time. Plan: -Initiate rosuvastatin at a dosage of 5 mg starting with every other day. -Follow-up with Dr. Hilarie Fredrickson as scheduled in June for repeat labs.  If cholesterol levels are not well-managed we can always consider increasing the dosage. -If you note any side effects or concerning symptoms please do not hesitate to reach out.

## 2022-06-16 NOTE — Assessment & Plan Note (Signed)
[name] has a history of B12 deficiency and has previously responded well to B12 injections.  With the history of ulcerative colitis it is most likely that she is unable to appropriately absorb this vitamin orally. Plan: -Prescription for B12 injections has been renewed.  Will plan to monitor labs at follow-up visit.

## 2022-06-18 ENCOUNTER — Other Ambulatory Visit (HOSPITAL_COMMUNITY): Payer: Self-pay

## 2022-06-19 ENCOUNTER — Other Ambulatory Visit (HOSPITAL_COMMUNITY): Payer: Self-pay

## 2022-06-22 ENCOUNTER — Other Ambulatory Visit: Payer: Self-pay

## 2022-06-23 ENCOUNTER — Other Ambulatory Visit (HOSPITAL_COMMUNITY): Payer: Self-pay

## 2022-06-24 ENCOUNTER — Other Ambulatory Visit (HOSPITAL_COMMUNITY): Payer: Self-pay

## 2022-06-25 ENCOUNTER — Other Ambulatory Visit (HOSPITAL_COMMUNITY): Payer: Self-pay

## 2022-06-26 ENCOUNTER — Other Ambulatory Visit (HOSPITAL_COMMUNITY): Payer: Self-pay

## 2022-06-29 ENCOUNTER — Telehealth: Payer: Self-pay | Admitting: Internal Medicine

## 2022-06-29 ENCOUNTER — Other Ambulatory Visit: Payer: Self-pay

## 2022-06-29 ENCOUNTER — Other Ambulatory Visit (HOSPITAL_COMMUNITY): Payer: Self-pay

## 2022-06-29 MED ORDER — RINVOQ 30 MG PO TB24
30.0000 mg | ORAL_TABLET | Freq: Every day | ORAL | 11 refills | Status: DC
  Filled 2022-06-29 – 2022-06-30 (×2): qty 30, 30d supply, fill #0

## 2022-06-29 NOTE — Telephone Encounter (Signed)
Script refilled. Pt needs prior auth for Rinvoq.

## 2022-06-29 NOTE — Telephone Encounter (Signed)
PT needs Rinvoq refilled. Needs a Prior authorization and sent to [facility]

## 2022-06-30 ENCOUNTER — Other Ambulatory Visit: Payer: Self-pay | Admitting: Pharmacist

## 2022-06-30 ENCOUNTER — Telehealth: Payer: Self-pay | Admitting: Pharmacy Technician

## 2022-06-30 ENCOUNTER — Other Ambulatory Visit (HOSPITAL_COMMUNITY): Payer: Self-pay

## 2022-06-30 ENCOUNTER — Other Ambulatory Visit: Payer: Self-pay

## 2022-06-30 MED ORDER — RINVOQ 30 MG PO TB24
30.0000 mg | ORAL_TABLET | Freq: Every day | ORAL | 11 refills | Status: DC
  Filled 2022-06-30 – 2022-07-01 (×2): qty 30, 30d supply, fill #0
  Filled 2022-07-24: qty 30, 30d supply, fill #1
  Filled 2022-08-20: qty 30, 30d supply, fill #2
  Filled 2022-09-18: qty 30, 30d supply, fill #3
  Filled 2022-10-14: qty 30, 30d supply, fill #4
  Filled 2022-11-19 – 2022-11-23 (×2): qty 30, 30d supply, fill #5
  Filled 2022-12-18: qty 30, 30d supply, fill #6
  Filled 2023-01-15 – 2023-01-22 (×2): qty 30, 30d supply, fill #7
  Filled 2023-02-17: qty 30, 30d supply, fill #8
  Filled 2023-03-18: qty 30, 30d supply, fill #9
  Filled 2023-04-13: qty 30, 30d supply, fill #10
  Filled 2023-05-13: qty 30, 30d supply, fill #11

## 2022-06-30 NOTE — Telephone Encounter (Signed)
Patient Advocate Encounter  Received notification from [facility] that prior authorization for [facility]  is required.   PA submitted on 4.16.24 Key [id number] Status is pending

## 2022-06-30 NOTE — Telephone Encounter (Signed)
Patient Advocate Encounter  Prior Authorization for RINVOQ  has been approved.    PA# [id number] Effective dates: 4.16.24 through 4.15.25

## 2022-07-01 ENCOUNTER — Other Ambulatory Visit: Payer: Self-pay

## 2022-07-01 ENCOUNTER — Other Ambulatory Visit (HOSPITAL_COMMUNITY): Payer: Self-pay

## 2022-07-02 ENCOUNTER — Other Ambulatory Visit: Payer: Self-pay

## 2022-07-04 ENCOUNTER — Other Ambulatory Visit (HOSPITAL_COMMUNITY): Payer: Self-pay

## 2022-07-07 ENCOUNTER — Other Ambulatory Visit (HOSPITAL_COMMUNITY): Payer: Self-pay

## 2022-07-13 ENCOUNTER — Other Ambulatory Visit (HOSPITAL_COMMUNITY): Payer: Self-pay

## 2022-07-22 ENCOUNTER — Other Ambulatory Visit (HOSPITAL_COMMUNITY): Payer: Self-pay

## 2022-07-22 ENCOUNTER — Ambulatory Visit (HOSPITAL_BASED_OUTPATIENT_CLINIC_OR_DEPARTMENT_OTHER): Payer: 59

## 2022-07-23 ENCOUNTER — Ambulatory Visit (HOSPITAL_BASED_OUTPATIENT_CLINIC_OR_DEPARTMENT_OTHER)
Admission: RE | Admit: 2022-07-23 | Discharge: 2022-07-23 | Disposition: A | Discharge status: Home / Self Care | Payer: 59 | Source: Ambulatory Visit | Attending: Nurse Practitioner | Admitting: Nurse Practitioner

## 2022-07-23 DIAGNOSIS — E782 Mixed hyperlipidemia: Secondary | ICD-10-CM | POA: Insufficient documentation

## 2022-07-23 DIAGNOSIS — E663 Overweight: Secondary | ICD-10-CM | POA: Insufficient documentation

## 2022-07-24 ENCOUNTER — Other Ambulatory Visit: Payer: Self-pay

## 2022-07-24 ENCOUNTER — Other Ambulatory Visit (HOSPITAL_COMMUNITY): Payer: Self-pay

## 2022-07-28 ENCOUNTER — Other Ambulatory Visit (HOSPITAL_COMMUNITY): Payer: Self-pay

## 2022-07-29 ENCOUNTER — Other Ambulatory Visit: Payer: Self-pay | Admitting: Nurse Practitioner

## 2022-07-29 DIAGNOSIS — I2510 Atherosclerotic heart disease of native coronary artery without angina pectoris: Secondary | ICD-10-CM | POA: Insufficient documentation

## 2022-08-20 ENCOUNTER — Other Ambulatory Visit (HOSPITAL_COMMUNITY): Payer: Self-pay

## 2022-08-21 ENCOUNTER — Other Ambulatory Visit: Payer: Self-pay

## 2022-08-21 ENCOUNTER — Other Ambulatory Visit (HOSPITAL_COMMUNITY): Payer: Self-pay

## 2022-08-29 ENCOUNTER — Other Ambulatory Visit (HOSPITAL_COMMUNITY): Payer: Self-pay

## 2022-09-05 ENCOUNTER — Telehealth: Payer: 59 | Admitting: Nurse Practitioner

## 2022-09-05 ENCOUNTER — Other Ambulatory Visit (HOSPITAL_COMMUNITY): Payer: Self-pay

## 2022-09-05 DIAGNOSIS — J0100 Acute maxillary sinusitis, unspecified: Secondary | ICD-10-CM | POA: Diagnosis not present

## 2022-09-05 MED ORDER — SULFAMETHOXAZOLE-TRIMETHOPRIM 800-160 MG PO TABS
1.0000 | ORAL_TABLET | Freq: Two times a day (BID) | ORAL | 0 refills | Status: DC
  Filled 2022-09-05: qty 20, 10d supply, fill #0

## 2022-09-05 NOTE — Patient Instructions (Signed)
  [name], thank you for joining Bennie Pierini, FNP for today's virtual visit.  While this provider is not your primary care provider (PCP), if your PCP is located in our provider database this encounter information will be shared with them immediately following your visit.   A [facility] MyChart account gives you access to today's visit and all your visits, tests, and labs performed at [facility] " click here if you don't have a [facility] MyChart account or go to [email or web address]  Consent: (Patient) [name] provided verbal consent for this virtual visit at the beginning of the encounter.  Current Medications:  Current Outpatient Medications:    cyanocobalamin (VITAMIN B12) 1000 MCG/ML injection, Inject 1 mL (1,000 mcg total) into the muscle every 30 (thirty) days. Please include syringes and IM needles for injections., Disp: 3 mL, Rfl: 3   phentermine (ADIPEX-P) 37.5 MG tablet, Take 1 tablet (37.5 mg total) by mouth daily before breakfast., Disp: 90 tablet, Rfl: 1   rosuvastatin (CRESTOR) 5 MG tablet, Take 1 tablet (5 mg total) by mouth daily., Disp: 90 tablet, Rfl: 3   sertraline (ZOLOFT) 50 MG tablet, Take 1 tablet (50 mg total) by mouth daily., Disp: 90 tablet, Rfl: 3   Upadacitinib ER (RINVOQ) 30 MG TB24, Take 1 tablet  (30 mg) by mouth daily., Disp: 30 tablet, Rfl: 11   Medications ordered in this encounter:  No orders of the defined types were placed in this encounter.    *If you need refills on other medications prior to your next appointment, please contact your pharmacy*  Follow-Up: Call back or seek an in-person evaluation if the symptoms worsen or if the condition fails to improve as anticipated.  [facility] [phone]  Other Instructions 1. Take meds as prescribed 2. Use a cool mist humidifier especially during the winter months and when heat has been humid. 3. Use saline nose sprays frequently 4. Saline  irrigations of the nose can be very helpful if done frequently.  * 4X daily for 1 week*  * Use of a nettie pot can be helpful with this. Follow directions with this* 5. Drink plenty of fluids 6. Keep thermostat turn down low 7.For any cough or congestion- mucinex 8. For fever or aces or pains- take tylenol or ibuprofen appropriate for age and weight.  * for fevers greater than 101 orally you may alternate ibuprofen and tylenol every  3 hours.      If you have been instructed to have an in-person evaluation today at a local Urgent Care facility, please use the link below. It will take you to a list of all of our available [facility], including address, phone number and hours of operation. Please do not delay care.  [facility]  If you or a family member do not have a primary care provider, use the link below to schedule a visit and establish care. When you choose a [facility] primary care physician or advanced practice provider, you gain a long-term partner in health. Find a Primary Care Provider  Learn more about [facility] in-office and virtual care options: [place] - Get Care Now

## 2022-09-05 NOTE — Progress Notes (Signed)
Virtual Visit Consent   [name], you are scheduled for a virtual visit with Kim Daphine Deutscher, FNP, a [facility] provider, today.     Just as with appointments in the office, your consent must be obtained to participate.  Your consent will be active for this visit and any virtual visit you may have with one of our providers in the next 365 days.     If you have a MyChart account, a copy of this consent can be sent to you electronically.  All virtual visits are billed to your insurance company just like a traditional visit in the office.    As this is a virtual visit, video technology does not allow for your provider to perform a traditional examination.  This may limit your provider's ability to fully assess your condition.  If your provider identifies any concerns that need to be evaluated in person or the need to arrange testing (such as labs, EKG, etc.), we will make arrangements to do so.     Although advances in technology are sophisticated, we cannot ensure that it will always work on either your end or our end.  If the connection with a video visit is poor, the visit may have to be switched to a telephone visit.  With either a video or telephone visit, we are not always able to ensure that we have a secure connection.     I need to obtain your verbal consent now.   Are you willing to proceed with your visit today? YES   Ilyanna A Holycross has provided verbal consent on 09/05/2022 for a virtual visit (video or telephone).   Kim Daphine Deutscher, FNP   Date: 09/05/2022 11:23 AM   Virtual Visit via Video Note   I, [name], connected with [name] ([id number], 12-29-1965) on 09/05/22 at 11:45 AM EDT by a video-enabled telemedicine application and verified that I am speaking with the correct person using two identifiers.  Location: Patient: Virtual Visit Location Patient: Home Provider: Virtual Visit Location Provider: Mobile   I discussed the limitations of  evaluation and management by telemedicine and the availability of in person appointments. The patient expressed understanding and agreed to proceed.    History of Present Illness: [name] is a 57 y.o. who identifies as a female who was assigned female at birth, and is being seen today for sinusitis .  HPI: Sinusitis This is a new problem. The current episode started in the past 7 days. The problem has been waxing and waning since onset. There has been no fever. Her pain is at a severity of 7/10. The pain is moderate. Associated symptoms include congestion, ear pain, headaches and sinus pressure. Pertinent negatives include no chills or sore throat. Past treatments include acetaminophen (flonase). The treatment provided mild relief.    Bactrim works best for her    Problems:  Patient Active Problem List   Diagnosis Date Noted   Coronary atherosclerosis due to calcified coronary lesion of native artery 07/29/2022   B12 deficiency 06/16/2022   Immunocompromised (HCC) 07/01/2020   Encounter for annual physical exam 07/01/2020   Mixed hyperlipidemia 08/04/2019   Overweight (BMI 25.0-29.9) 09/02/2017   Pyoderma gangrenosum 12/09/2016   LEFT SIDED ULCERATIVE COLITIS 08/15/2008    Allergies:  Allergies  Allergen Reactions   Doxycycline Rash   Medications:  Current Outpatient Medications:    cyanocobalamin (VITAMIN B12) 1000 MCG/ML injection, Inject 1 mL (1,000 mcg total) into the muscle every 30 (thirty)  days. Please include syringes and IM needles for injections., Disp: 3 mL, Rfl: 3   phentermine (ADIPEX-P) 37.5 MG tablet, Take 1 tablet (37.5 mg total) by mouth daily before breakfast., Disp: 90 tablet, Rfl: 1   rosuvastatin (CRESTOR) 5 MG tablet, Take 1 tablet (5 mg total) by mouth daily., Disp: 90 tablet, Rfl: 3   sertraline (ZOLOFT) 50 MG tablet, Take 1 tablet (50 mg total) by mouth daily., Disp: 90 tablet, Rfl: 3   Upadacitinib ER (RINVOQ) 30 MG TB24, Take 1 tablet  (30 mg) by  mouth daily., Disp: 30 tablet, Rfl: 11  Observations/Objective: Patient is well-developed, well-nourished in no acute distress.  Resting comfortably  at home.  Head is normocephalic, atraumatic.  No labored breathing.  Speech is clear and coherent with logical content.  Patient is alert and oriented at baseline.  Maxillary sinus pressure bil  Assessment and Plan:  [name] in today with chief complaint of Sinusitis   1. Acute non-recurrent maxillary sinusitis 1. Take meds as prescribed 2. Use a cool mist humidifier especially during the winter months and when heat has been humid. 3. Use saline nose sprays frequently 4. Saline irrigations of the nose can be very helpful if done frequently.  * 4X daily for 1 week*  * Use of a nettie pot can be helpful with this. Follow directions with this* 5. Drink plenty of fluids 6. Keep thermostat turn down low 7.For any cough or congestion- mucinex or delsym 8. For fever or aces or pains- take tylenol or ibuprofen appropriate for age and weight.  * for fevers greater than 101 orally you may alternate ibuprofen and tylenol every  3 hours.      Follow Up Instructions: I discussed the assessment and treatment plan with the patient. The patient was provided an opportunity to ask questions and all were answered. The patient agreed with the plan and demonstrated an understanding of the instructions.  A copy of instructions were sent to the patient via MyChart.  The patient was advised to call back or seek an in-person evaluation if the symptoms worsen or if the condition fails to improve as anticipated.  Time:  I spent 8 minutes with the patient via telehealth technology discussing the above problems/concerns.    Kim Daphine Deutscher, FNP

## 2022-09-14 ENCOUNTER — Other Ambulatory Visit: Payer: Self-pay

## 2022-09-18 ENCOUNTER — Other Ambulatory Visit (HOSPITAL_COMMUNITY): Payer: Self-pay

## 2022-09-21 ENCOUNTER — Other Ambulatory Visit (HOSPITAL_COMMUNITY): Payer: Self-pay

## 2022-09-23 ENCOUNTER — Other Ambulatory Visit (HOSPITAL_COMMUNITY): Payer: Self-pay

## 2022-10-02 ENCOUNTER — Other Ambulatory Visit (HOSPITAL_COMMUNITY): Payer: Self-pay

## 2022-10-02 ENCOUNTER — Ambulatory Visit (INDEPENDENT_AMBULATORY_CARE_PROVIDER_SITE_OTHER): Payer: 59 | Admitting: Medical

## 2022-10-02 VITALS — BP 110/68 | HR 67 | Temp 98.2°F | Resp 16 | Wt 167.2 lb

## 2022-10-02 DIAGNOSIS — J988 Other specified respiratory disorders: Secondary | ICD-10-CM | POA: Diagnosis not present

## 2022-10-02 DIAGNOSIS — R058 Other specified cough: Secondary | ICD-10-CM | POA: Diagnosis not present

## 2022-10-02 MED ORDER — AZITHROMYCIN 250 MG PO TABS
ORAL_TABLET | ORAL | 0 refills | Status: DC
  Filled 2022-10-02: qty 6, 5d supply, fill #0

## 2022-10-02 NOTE — Progress Notes (Signed)
Subjective:  [name] is a 57 y.o. female who presents for Chief Complaint  Patient presents with   URI    Possible bronchitis, symptoms started Wednesday- fever, cough, negative covid test this morning, body aches, brown phelgm feels like its in chest now.      Here for illness.  Started 2 days ago with fever, aches, cough, thought she had covid.  Tested 2 different times, negative. Fever broke last night.   Awoke sweaty this morning.  Currently has congestion moving into chest, getting brown productive cough.  Has mild sore throat, no ear pain.  No NVD.  No appetite.  No SOB or wheezing.  No headaches.   No other aggravating or relieving factors.    No other c/o.  Past Medical History:  Diagnosis Date   DDD (degenerative disc disease), lumbar    S-I joints   Heart murmur    Hyperlipidemia    Superficial granulomatous pyoderma    Ulcerative colitis    Current Outpatient Medications on File Prior to Visit  Medication Sig Dispense Refill   cyanocobalamin (VITAMIN B12) 1000 MCG/ML injection Inject 1 mL (1,000 mcg total) into the muscle every 30 (thirty) days. Please include syringes and IM needles for injections. 3 mL 3   rosuvastatin (CRESTOR) 5 MG tablet Take 1 tablet (5 mg total) by mouth daily. 90 tablet 3   sertraline (ZOLOFT) 50 MG tablet Take 1 tablet (50 mg total) by mouth daily. 90 tablet 3   Upadacitinib ER (RINVOQ) 30 MG TB24 Take 1 tablet  (30 mg) by mouth daily. 30 tablet 11   No current facility-administered medications on file prior to visit.     The following portions of the patient's history were reviewed and updated as appropriate: allergies, current medications, past family history, past medical history, past social history, past surgical history and problem list.  ROS Otherwise as in subjective above    Objective: BP 110/68   Pulse 67   Temp 98.2 F (36.8 C)   Resp 16   Wt 167 lb 3.2 oz (75.8 kg)   SpO2 98%   BMI 28.70 kg/m   General  appearance: alert, no distress, well developed, well nourished HEENT: normocephalic, sclerae anicteric, conjunctiva pink and moist, TMs pearly, nares patent, no discharge or erythema, pharynx normal Oral cavity: MMM, no lesions Neck: supple, no lymphadenopathy, no thyromegaly, no masses Heart: RRR, normal S1, S2, no murmurs Lungs: CTA bilaterally, no wheezes, rhonchi, or rales Abdomen: +bs, soft, non tender, non distended, no masses, no hepatomegaly, no splenomegaly Pulses: 2+ radial pulses, 2+ pedal pulses, normal cap refill Ext: no edema    Assessment: Encounter Diagnoses  Name Primary?   Productive cough Yes   Respiratory tract infection      Plan: Symptoms suggest viral URI currently Begin Mucinex DM, increase water intake, rest You can use tylenol for aches/not feeling well If no improvement, worse, or more productive cough over productive nasal discharge in the next 48 hours, can begin zpak. Otherwise symptoms and exam suggest that this may just be viral. Get re-evaluated this weekend if way worse   [name] was seen today for uri.  Diagnoses and all orders for this visit:  Productive cough  Respiratory tract infection  Other orders -     azithromycin (ZITHROMAX) 250 MG tablet; Take 2 tablets day 1, then 1 tablet days 2-4    Follow up: prn

## 2022-10-05 ENCOUNTER — Encounter: Payer: Self-pay | Admitting: Internal Medicine

## 2022-10-05 ENCOUNTER — Telehealth (INDEPENDENT_AMBULATORY_CARE_PROVIDER_SITE_OTHER): Payer: 59 | Admitting: Medical

## 2022-10-05 ENCOUNTER — Ambulatory Visit: Admission: RE | Admit: 2022-10-05 | Payer: 59 | Source: Ambulatory Visit

## 2022-10-05 ENCOUNTER — Other Ambulatory Visit (HOSPITAL_COMMUNITY): Payer: Self-pay

## 2022-10-05 ENCOUNTER — Ambulatory Visit: Payer: 59 | Admitting: Internal Medicine

## 2022-10-05 VITALS — BP 118/70 | HR 79 | Ht 64.0 in | Wt 166.0 lb

## 2022-10-05 DIAGNOSIS — Z872 Personal history of diseases of the skin and subcutaneous tissue: Secondary | ICD-10-CM

## 2022-10-05 DIAGNOSIS — J988 Other specified respiratory disorders: Secondary | ICD-10-CM | POA: Diagnosis not present

## 2022-10-05 DIAGNOSIS — K5150 Left sided colitis without complications: Secondary | ICD-10-CM | POA: Diagnosis not present

## 2022-10-05 DIAGNOSIS — M0760 Enteropathic arthropathies, unspecified site: Secondary | ICD-10-CM

## 2022-10-05 DIAGNOSIS — K639 Disease of intestine, unspecified: Secondary | ICD-10-CM | POA: Diagnosis not present

## 2022-10-05 DIAGNOSIS — E782 Mixed hyperlipidemia: Secondary | ICD-10-CM | POA: Diagnosis not present

## 2022-10-05 DIAGNOSIS — R059 Cough, unspecified: Secondary | ICD-10-CM | POA: Diagnosis not present

## 2022-10-05 MED ORDER — ALBUTEROL SULFATE HFA 108 (90 BASE) MCG/ACT IN AERS
2.0000 | INHALATION_SPRAY | Freq: Four times a day (QID) | RESPIRATORY_TRACT | 0 refills | Status: DC | PRN
  Filled 2022-10-05: qty 6.7, 25d supply, fill #0

## 2022-10-05 MED ORDER — HYDROCODONE BIT-HOMATROP MBR 5-1.5 MG/5ML PO SOLN
5.0000 mL | Freq: Three times a day (TID) | ORAL | 0 refills | Status: AC | PRN
  Filled 2022-10-05: qty 75, 5d supply, fill #0

## 2022-10-05 NOTE — Patient Instructions (Signed)
Continue Rinvoq as directed  Your provider has requested that you go to the basement level for lab work before leaving today. Press "B" on the elevator. The lab is located at the first door on the left as you exit the elevator.  _______________________________________________________  If your blood pressure at your visit was 140/90 or greater, please contact your primary care physician to follow up on this.  _______________________________________________________  If you are age 96 or older, your body mass index should be between 23-30. Your Body mass index is 28.49 kg/m. If this is out of the aforementioned range listed, please consider follow up with your Primary Care Provider.  If you are age 63 or younger, your body mass index should be between 19-25. Your Body mass index is 28.49 kg/m. If this is out of the aformentioned range listed, please consider follow up with your Primary Care Provider.   ________________________________________________________  The [facility] providers would like to encourage you to use [facility] to communicate with providers for non-urgent requests or questions.  Due to long hold times on the telephone, sending your provider a message by [facility] may be a faster and more efficient way to get a response.  Please allow 48 business hours for a response.  Please remember that this is for non-urgent requests.  _______________________________________________________  Follow up -6 Months, office to call with an appointment.   Thank you for choosing me and [facility].  Dr.Jay Pyrtle

## 2022-10-05 NOTE — Progress Notes (Signed)
Subjective:    Patient ID: [name], female    DOB: 05-12-1965, 57 y.o.   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  She was last seen on 04/07/2022.  She is here alone today.  She was previously managed well on Simponi but after a flare in 2023 she was switched to Rinvoq.  She continues Rinvoq 30 mg daily  She reports she is doing very well.  She has no colitis symptoms.  She has mild constipation which is new for her.  She will occasionally use MiraLAX.  She has no abdominal pain, blood in stool or mucus in stool.  She stopped the Lialda about 4 months ago without change in her bowel habit.  Her joint symptoms have not been bad and in fact not bothering her.  No oral ulcers or ocular complaint.  No rash.  She was started on Crestor 5 mg every other day which she is tolerating.  She had a cardiac calcium score done which showed some disease in the LAD.  She is working at [employer] and enjoying it.  He is currently on azithromycin for sinusitis and it has been helping.  About 3 months ago she used Bactrim for sinusitis.  She does not feel she is having frequent infections.   Review of Systems As per HPI, otherwise negative  Current Medications, Allergies, Past Medical History, Past Surgical History, Family History and Social History were reviewed in [employer] record.    Objective:   Physical Exam BP 118/70   Pulse 79   Ht 5\' 4"  (1.626 m)   Wt 166 lb (75.3 kg)   BMI 28.49 kg/m  Gen: awake, alert, NAD HEENT: anicteric  Neuro: nonfocal     Latest Ref Rng & Units 04/07/2022    8:53 AM 11/12/2021    1:13 PM 01/03/2021   11:28 AM  CBC  WBC 4.0 - 10.5 K/uL 4.3  10.4  6.4   Hemoglobin 12.0 - 15.0 g/dL 14.7  82.9  56.2   Hematocrit 36.0 - 46.0 % 35.7  41.5  41.3   Platelets 150.0 - 400.0 K/uL 323.0   281     CMP     Component Value Date/Time   NA 139 04/07/2022 0853   NA 140 11/12/2021 1313   K 4.0 04/07/2022 0853   CL 103 04/07/2022 0853   CO2 26 04/07/2022 0853   GLUCOSE 83 04/07/2022 0853   BUN 25 (H) 04/07/2022 0853   BUN 15 11/12/2021 1313   CREATININE 0.74 04/07/2022 0853   CREATININE 0.62 03/22/2020 1040   CALCIUM 9.2 04/07/2022 0853   PROT 7.5 04/07/2022 0853   PROT 7.4 11/12/2021 1313   ALBUMIN 4.5 04/07/2022 0853   ALBUMIN 4.4 11/12/2021 1313   AST 24 04/07/2022 0853   ALT 20 04/07/2022 0853   ALKPHOS 55 04/07/2022 0853   BILITOT 0.5 04/07/2022 0853   BILITOT 0.4 11/12/2021 1313   GFR 91.47 04/07/2022 0853   EGFR 105 11/12/2021 1313   GFRNONAA 90 09/02/2017 0925   Lipid Panel     Component Value Date/Time   CHOL 240 (H) 04/07/2022 0853   CHOL 244 (H) 11/12/2021 1313   TRIG 63.0 04/07/2022 0853   HDL 60.50 04/07/2022 0853   HDL 87 11/12/2021 1313   CHOLHDL 4 04/07/2022 0853   VLDL 12.6 04/07/2022  0853   LDLCALC 167 (H) 04/07/2022 0853   LDLCALC 134 (H) 11/12/2021 1313   LDLCALC 166 (H) 03/22/2020 1040   LDLDIRECT 142.8 03/03/2010 1248   LABVLDL 23 11/12/2021 1313         Assessment & Plan:  57 year old female with longstanding ulcerative colitis (dx age 89), history of pyoderma gangrenosum, 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  1.  Longstanding left-sided ulcerative colitis (dx 1990s; Simponi stopped due to lack of response and low drug level without antibodies in 2023; Rinvoq started late 2023) --clinically she is doing very well on Rinvoq 30 mg daily.  No complications or known side effects.  She has reached clinical remission and at last visit her fecal calprotectin was near normal at 57 -- Continue Rinvoq 30 mg daily -- Repeat fecal calprotectin -- Annual follow-up with dermatology recommended -- 69-month follow-up with me -- Surveillance colonoscopy at the 3-year mark given last exam was endoscopic and histologic remission  without polyps; October 2025  2.  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  History of pyoderma gangrenosum --resolved with colitis therapy, follows with dermatology  4.  Hyperlipidemia --some CAD based on cardiac CT.  She has been started on rosuvastatin and will follow-up response with primary care

## 2022-10-05 NOTE — Progress Notes (Signed)
This visit type was conducted due to national recommendations for restrictions regarding the COVID-19 Pandemic (e.g. social distancing) in an effort to limit this patient's exposure and mitigate transmission in our community.  Due to their co-morbid illnesses, this patient is at least at moderate risk for complications without adequate follow up.  This format is felt to be most appropriate for this patient at this time.    Documentation for virtual audio and video telecommunications through [facility] encounter:  The patient was located at home. The provider was located in the office. The patient did consent to this visit and is aware of possible charges through their insurance for this visit.  The other persons participating in this telemedicine service were none. Time spent on call was 15 minutes and in review of previous records >20 minutes total.  This virtual service is  related to other E/M service within previous 7 days.  Subjective:  [name] is a 57 y.o. female who presents for Chief Complaint  Patient presents with   sick- feel worse    Sick- feel worse, Cough up a lot of green phelgm, chest hurts a lot. Wheezing alittle big, body aches, bad headache. Started antibiotic Saturday and thought she was turning the corner but today is worse     Virtual for illness. I saw her 3 days ago for same.  At that time she had 2 days history of fever, aches, cough, thought she had covid.  Tested 2 different times, negative.  Fever had improved by her visit 3 days ago.  At Friday's visit she had sweats, congestion moving into chest, getting brown productive cough.  Has mild sore throat, no ear pain.  No NVD.  No appetite.  No SOB or wheezing.  No headaches.     Currently she reports up and down symptoms, sometimes worse, sometimes better.  Saturday, started coughing up more green mucous, felt horrible all day Saturday.  Sunday a little better, then last not coughed all night, ongoing green  mucous, body aches, headache.  On day 3 of Zpak, started Zpak Saturday.    Has some wheezing, but once coughing up sputum, the wheezing improved.  No prior use of inhaler, no hx/o asthma.  Is using mucinex DM as well.  No appetite, but drinking fluids throughout the day.  Repeat covid test Saturday also negative.    No other aggravating or relieving factors.    No other c/o.  Past Medical History:  Diagnosis Date   DDD (degenerative disc disease), lumbar    S-I joints   Heart murmur    Hyperlipidemia    Superficial granulomatous pyoderma    Ulcerative colitis    Current Outpatient Medications on File Prior to Visit  Medication Sig Dispense Refill   azithromycin (ZITHROMAX) 250 MG tablet Take 2 tablets by mouth on day 1, then take 1 tablet once daily on days 2 - 4 6 tablet 0   cyanocobalamin (VITAMIN B12) 1000 MCG/ML injection Inject 1 mL (1,000 mcg total) into the muscle every 30 (thirty) days. Please include syringes and IM needles for injections. 3 mL 3   rosuvastatin (CRESTOR) 5 MG tablet Take 1 tablet (5 mg total) by mouth daily. 90 tablet 3   sertraline (ZOLOFT) 50 MG tablet Take 1 tablet (50 mg total) by mouth daily. 90 tablet 3   Upadacitinib ER (RINVOQ) 30 MG TB24 Take 1 tablet  (30 mg) by mouth daily. 30 tablet 11   No current facility-administered medications on file  prior to visit.     The following portions of the patient's history were reviewed and updated as appropriate: allergies, current medications, past family history, past medical history, past social history, past surgical history and problem list.  ROS Otherwise as in subjective above    Objective: There were no vitals taken for this visit.  General appearance: alert, no distress, well developed, well nourished No labored breathing or wheezing   Assessment: Encounter Diagnosis  Name Primary?   Respiratory tract infection Yes      Plan: Continue Zpak, mucinex DM, good water intake and rest.   Can begin inhaler 2-3 times daily for sob and wheezing, cough syrup for worse cough as below.   Xray order placed.    Get re-evaluated if worse or not improving or if dehydrated, uncontrolled vomiting or other worse symptoms   Elice was seen today for sick- feel worse.  Diagnoses and all orders for this visit:  Respiratory tract infection -     DG Chest 2 View; Future  Other orders -     albuterol (VENTOLIN HFA) 108 (90 Base) MCG/ACT inhaler; Inhale 2 puffs into the lungs every 6 hours as needed for wheezing or shortness of breath. -     HYDROcodone bit-homatropine (HYCODAN) 5-1.5 MG/5ML syrup; Take 5 mLs by mouth every 8 hours as needed for up to 5 days for cough.     Follow up: pending chest xray

## 2022-10-07 ENCOUNTER — Encounter: Payer: Self-pay | Admitting: Nurse Practitioner

## 2022-10-07 DIAGNOSIS — L814 Other melanin hyperpigmentation: Secondary | ICD-10-CM | POA: Diagnosis not present

## 2022-10-07 DIAGNOSIS — Z08 Encounter for follow-up examination after completed treatment for malignant neoplasm: Secondary | ICD-10-CM | POA: Diagnosis not present

## 2022-10-07 DIAGNOSIS — L538 Other specified erythematous conditions: Secondary | ICD-10-CM | POA: Diagnosis not present

## 2022-10-07 DIAGNOSIS — Z85828 Personal history of other malignant neoplasm of skin: Secondary | ICD-10-CM | POA: Diagnosis not present

## 2022-10-07 DIAGNOSIS — L821 Other seborrheic keratosis: Secondary | ICD-10-CM | POA: Diagnosis not present

## 2022-10-07 DIAGNOSIS — L820 Inflamed seborrheic keratosis: Secondary | ICD-10-CM | POA: Diagnosis not present

## 2022-10-07 DIAGNOSIS — D485 Neoplasm of uncertain behavior of skin: Secondary | ICD-10-CM | POA: Diagnosis not present

## 2022-10-07 DIAGNOSIS — D225 Melanocytic nevi of trunk: Secondary | ICD-10-CM | POA: Diagnosis not present

## 2022-10-12 NOTE — Progress Notes (Signed)
Results sent through MyChart

## 2022-10-14 ENCOUNTER — Other Ambulatory Visit (HOSPITAL_COMMUNITY): Payer: Self-pay

## 2022-10-20 ENCOUNTER — Other Ambulatory Visit (HOSPITAL_COMMUNITY): Payer: Self-pay

## 2022-10-20 ENCOUNTER — Other Ambulatory Visit: Payer: Self-pay

## 2022-10-21 ENCOUNTER — Other Ambulatory Visit: Payer: 59

## 2022-10-21 DIAGNOSIS — K5150 Left sided colitis without complications: Secondary | ICD-10-CM | POA: Diagnosis not present

## 2022-10-28 ENCOUNTER — Other Ambulatory Visit (HOSPITAL_COMMUNITY): Payer: Self-pay

## 2022-10-29 ENCOUNTER — Other Ambulatory Visit (HOSPITAL_COMMUNITY): Payer: Self-pay

## 2022-11-10 ENCOUNTER — Other Ambulatory Visit (HOSPITAL_COMMUNITY): Payer: Self-pay

## 2022-11-18 ENCOUNTER — Other Ambulatory Visit (HOSPITAL_COMMUNITY): Payer: Self-pay

## 2022-11-19 ENCOUNTER — Other Ambulatory Visit: Payer: Self-pay

## 2022-11-23 ENCOUNTER — Other Ambulatory Visit (HOSPITAL_COMMUNITY): Payer: Self-pay

## 2022-11-24 ENCOUNTER — Other Ambulatory Visit (HOSPITAL_COMMUNITY): Payer: Self-pay

## 2022-11-24 ENCOUNTER — Ambulatory Visit (INDEPENDENT_AMBULATORY_CARE_PROVIDER_SITE_OTHER): Payer: 59 | Admitting: Nurse Practitioner

## 2022-11-24 ENCOUNTER — Encounter: Payer: Self-pay | Admitting: Nurse Practitioner

## 2022-11-24 VITALS — BP 124/82 | HR 67 | Wt 171.2 lb

## 2022-11-24 DIAGNOSIS — R300 Dysuria: Secondary | ICD-10-CM

## 2022-11-24 DIAGNOSIS — N3001 Acute cystitis with hematuria: Secondary | ICD-10-CM

## 2022-11-24 LAB — POCT URINALYSIS DIP (CLINITEK)
Bilirubin, UA: NEGATIVE
Blood, UA: NEGATIVE
Glucose, UA: NEGATIVE mg/dL
Ketones, POC UA: NEGATIVE mg/dL
Nitrite, UA: NEGATIVE
POC PROTEIN,UA: NEGATIVE
Spec Grav, UA: 1.01 (ref 1.010–1.025)
Urobilinogen, UA: 0.2 U/dL — AB
pH, UA: 6 (ref 5.0–8.0)

## 2022-11-24 MED ORDER — CIPROFLOXACIN HCL 500 MG PO TABS
500.0000 mg | ORAL_TABLET | Freq: Two times a day (BID) | ORAL | 0 refills | Status: AC
  Filled 2022-11-24: qty 6, 3d supply, fill #0

## 2022-11-24 NOTE — Patient Instructions (Signed)
Hemorrhagic Cystitis Hemorrhagic cystitis is bleeding from damage to the inner lining of the bladder. This condition results when the inner lining of the bladder (transitional epithelium) is damaged along with the blood vessels that supply the area. Hemorrhagic cystitis may make it difficult or painful to pass urine. What are the causes? This condition may be caused by: Damage from certain cancer treatments. This is the most common cause. It can result from: Radiation treatment that involves the bladder. Chemotherapy drugs used to treat certain cancers or to treat people who have a bone marrow transplant (cyclophosphamide and ifosfamide). Infections with bacteria or viruses, especially in people with a weak body defense system (immune system). Rare causes of the condition include: Other drugs, including penicillin drugs and a type of steroid (danazol). Exposure to toxic chemicals used in dyes, markers, shoe polish, or pesticides. What are the signs or symptoms? The main sign of this condition is blood in the urine (hematuria). This can range from very mild to severe. Mild hematuria can include microscopic bleeding that does not change the color of your urine. Severe hematuria can cause you to have urine with bright red blood or blood clots in it. In some cases, severe hematuria can cause large clots that fill the bladder and block urine flow (urinary obstruction). Hemorrhagic cystitis may also cause symptoms such as: An urgent or frequent need to pass urine. Pain when passing urine. Lower belly pain and fullness. Urinary obstruction. How is this diagnosed? This condition may be diagnosed based on: Your symptoms and medical history. A physical exam. Tests, such as: Urine tests to check for blood or signs of infection. Blood tests to check for signs of infection and a low red blood cell count due to bleeding. Imaging tests of the bladder, such as ultrasound, CT scan, or MRI. A procedure to  examine the inside of your bladder using a flexible scope (cystoscopy). How is this treated? Treatment depends on the cause of the condition and how severe the bleeding is. If you are being treated with chemotherapy drugs, you may be given other medicines to reduce the risk for this condition during treatment. Other treatments may include: Removing your exposure to substances that are causing the condition, such as a chemical toxin or a medicine. Taking antibiotic or antiviral medicine if the condition is caused by an infection. You may also be treated for hematuria. This can include: Fluids (hydration), bed rest, and observation. These methods may be all that is needed if you are able to pass urine and have no blood clots. Placing a flexible tube (catheter) into the bladder to continuously flush out (irrigate) the bladder with sterile saline solution. This may be needed if clots are passing or if bleeding is continuing. Medicine to reduce bleeding. A cystoscopy to remove clots if clots are filling the bladder. This may also include a procedure to stop bleeding (coagulation). A transfusion to replace blood loss. You may need surgery to stop bleeding or to remove the bladder if other treatments have not helped. Follow these instructions at home:  If you had surgery, your health care provider will give you instructions for taking care of yourself at home after your procedure. Follow these instructions carefully. Take over-the-counter and prescription medicines only as told by your health care provider. If you were prescribed an antibiotic medicine, take it as told by your health care provider. Do not stop using the antibiotic even if you start to feel better. Return to your normal activities as told  by your health care provider. Ask your health care provider what activities are safe for you. Drink enough fluid to keep your urine pale yellow. Keep all follow-up visits as told by your health care  provider. This is important. Contact a health care provider if you have: Chills or a fever. Blood in your urine. An urgent or frequent need to pass urine. Pain when passing urine. Get help right away if you: Have bright red blood or clots in your urine. Are unable to pass urine. Summary Hemorrhagic cystitis is bleeding caused by damage to the inner lining of your bladder. Hemorrhagic cystitis may make it difficult or painful to pass urine. This condition may be caused by damage from infections, radiation therapy, or chemotherapy drugs. Blood in the urine (hematuria) is the main sign of hemorrhagic cystitis. Hematuria can be very mild and involve microscopic bleeding that does not change the color of urine. It can also be severe and include passing urine with bright red blood or blood clots in it. Treatment for hemorrhagic cystitis depends on the cause and severity of the condition. In most cases, the condition will clear up with supportive care that may include rest, fluids, and antibiotics, along with removing exposure to the cause. Other treatments may be needed in more serious cases. This information is not intended to replace advice given to you by your health care provider. Make sure you discuss any questions you have with your health care provider. Document Revised: 02/02/2022 Document Reviewed: 02/02/2022 Elsevier Patient Education  2024 [employer].

## 2022-11-24 NOTE — Progress Notes (Unsigned)
Tollie Eth, DNP, AGNP-c [facility] [address] [place], [place] [address] [phone]   ACUTE VISIT- ESTABLISHED PATIENT  Blood pressure 124/82, pulse 67, weight 171 lb 3.2 oz (77.7 kg).  Subjective:  HPI [name] is a 57 y.o. female presents to day for evaluation of acute concern(s).  Telisha reports dysuria that started within the last few days with a generalized feeling of aching and low back pain. She reports burning with urination at onset and gradual increased need for urination and urgency. She reports that yesterday while urinating she felt that there was blockage present in the urethra and only small amounts of urine were coming out, but when she looked the urine was bright red. This occurred several times. She has increased her water intake substantially and she tells me her urine is now normal coloration, but the burning is still present and there was an odor with her first urination of the morning. She has not had any new sexual partners and denies risk of STI. She does not typically get UTI's. She tells me the back pain was not in the flank area, but lower in the sacral region. She has no history of kidney stones. She is not running a fever. She tells me she does feel mildly unwell.    ROS negative except for what is listed in HPI. History, Medications, Surgery, SDOH, and Family History reviewed and updated as appropriate.  Objective:  Physical Exam Vitals and nursing note reviewed.  Constitutional:      General: She is not in acute distress.    Appearance: Normal appearance. She is not toxic-appearing.  HENT:     Head: Normocephalic.  Eyes:     Conjunctiva/sclera: Conjunctivae normal.  Cardiovascular:     Rate and Rhythm: Normal rate and regular rhythm.     Pulses: Normal pulses.     Heart sounds: Normal heart sounds.  Pulmonary:     Effort: Pulmonary effort is normal.     Breath sounds: Normal breath sounds.  Abdominal:     General:  Bowel sounds are normal. There is no distension.     Palpations: Abdomen is soft.     Tenderness: There is abdominal tenderness. There is no right CVA tenderness, left CVA tenderness or guarding.  Musculoskeletal:     Right lower leg: No edema.     Left lower leg: No edema.  Skin:    General: Skin is warm and dry.     Capillary Refill: Capillary refill takes less than 2 seconds.  Neurological:     Mental Status: She is alert and oriented to person, place, and time. Mental status is at baseline.  Psychiatric:        Mood and Affect: Mood normal.        Behavior: Behavior normal.        Suprapubic pain achy back pain  Assessment & Plan:   Problem List Items Addressed This Visit     Acute hemorrhagic cystitis - Primary    Symptoms and presentation consistent with acute hemorrhagic cystitis. Her UA today showed no blood, which is reassuring. She has been consuming an extra amount of water, which I suspect has helped to flush the kidneys and bladder. Differentials include kidney stone, although without flank pain or severe pain I suspect this to be much less likely. We discussed the findings of the UA and my recommendations.  Plan: - Start antibiotic treatment for cystitis - Continue to drink plenty of water - If  the blood returns, contact us immediately. We will consider urgent urology referral. I feel this can wait at this time given that the blood is gone.  - If symptoms of burning or generalized feeling of unwell do not improve, please let me know immediately.       Relevant Medications   ciprofloxacin (CIPRO) 500 MG tablet   Other Visit Diagnoses     Burning with urination       Relevant Orders   POCT URINALYSIS DIP (CLINITEK) (Completed)         Time: 20 minutes, >50% spent counseling, care coordination, chart review, and documentation.   Tollie Eth, DNP, AGNP-c

## 2022-11-26 DIAGNOSIS — N3001 Acute cystitis with hematuria: Secondary | ICD-10-CM | POA: Insufficient documentation

## 2022-11-26 HISTORY — DX: Acute cystitis with hematuria: N30.01

## 2022-11-26 NOTE — Assessment & Plan Note (Signed)
Symptoms and presentation consistent with acute hemorrhagic cystitis. Her UA today showed no blood, which is reassuring. She has been consuming an extra amount of water, which I suspect has helped to flush the kidneys and bladder. Differentials include kidney stone, although without flank pain or severe pain I suspect this to be much less likely. We discussed the findings of the UA and my recommendations.  Plan: - Start antibiotic treatment for cystitis - Continue to drink plenty of water - If the blood returns, contact us immediately. We will consider urgent urology referral. I feel this can wait at this time given that the blood is gone.  - If symptoms of burning or generalized feeling of unwell do not improve, please let me know immediately.

## 2022-11-30 ENCOUNTER — Ambulatory Visit (INDEPENDENT_AMBULATORY_CARE_PROVIDER_SITE_OTHER): Payer: 59 | Admitting: Nurse Practitioner

## 2022-11-30 ENCOUNTER — Encounter: Payer: Self-pay | Admitting: Nurse Practitioner

## 2022-11-30 VITALS — BP 116/76 | HR 57 | Ht 65.0 in | Wt 172.2 lb

## 2022-11-30 DIAGNOSIS — R7303 Prediabetes: Secondary | ICD-10-CM

## 2022-11-30 DIAGNOSIS — E538 Deficiency of other specified B group vitamins: Secondary | ICD-10-CM

## 2022-11-30 DIAGNOSIS — I2584 Coronary atherosclerosis due to calcified coronary lesion: Secondary | ICD-10-CM | POA: Diagnosis not present

## 2022-11-30 DIAGNOSIS — E559 Vitamin D deficiency, unspecified: Secondary | ICD-10-CM

## 2022-11-30 DIAGNOSIS — Z6828 Body mass index (BMI) 28.0-28.9, adult: Secondary | ICD-10-CM | POA: Diagnosis not present

## 2022-11-30 DIAGNOSIS — D849 Immunodeficiency, unspecified: Secondary | ICD-10-CM | POA: Diagnosis not present

## 2022-11-30 DIAGNOSIS — K5150 Left sided colitis without complications: Secondary | ICD-10-CM

## 2022-11-30 DIAGNOSIS — Z0000 Encounter for general adult medical examination without abnormal findings: Secondary | ICD-10-CM

## 2022-11-30 DIAGNOSIS — I2510 Atherosclerotic heart disease of native coronary artery without angina pectoris: Secondary | ICD-10-CM

## 2022-11-30 DIAGNOSIS — N951 Menopausal and female climacteric states: Secondary | ICD-10-CM

## 2022-11-30 DIAGNOSIS — E782 Mixed hyperlipidemia: Secondary | ICD-10-CM | POA: Diagnosis not present

## 2022-11-30 NOTE — Assessment & Plan Note (Signed)
Chronic. Currently on replacement injections. Monitoring today

## 2022-11-30 NOTE — Assessment & Plan Note (Signed)

## 2022-11-30 NOTE — Assessment & Plan Note (Signed)
Chronic. Lipids to be monitored today. No alarm symptoms present at this time. LDL Goal < 100. Controlled with  rosuvastatin 5mg  every other day dosing .  Plan: Continue current lipid-lowering therapy. orders written for new lab studies as appropriate; see orders Recommend Strict diet and exercise Diet and exercise recommendations provided.  Follow-up in 12months or sooner based on lab findings as appropriate.

## 2022-11-30 NOTE — Assessment & Plan Note (Signed)
Currently on statin therapy and working on diet and exercise. Labs pending.

## 2022-11-30 NOTE — Assessment & Plan Note (Signed)
Increased weight in the setting of peri-menopausal symptoms despite significant diet and exercise changes. She is unable to take hormones due to history of hormone positive breast cancer in her mother in her 67's. She did have success with saxenda in the past, but her insurance is no longer covering these weight management medications. Her weight changes do not correlate with her activity levels and dietary habits.  Plan: - Referral sent to nutrition services to aid in healthy dietary habits for weight management.  - Information provided on AVS.

## 2022-11-30 NOTE — Patient Instructions (Addendum)
I have sent the referral for the nutrition services. They will call to schedule you.   I will let you know if we need to make changes based on your labs.   WEIGHT LOSS PLANNING Your progress today shows:     11/30/2022    9:12 AM 11/24/2022   10:30 AM 10/05/2022    9:05 AM  Vitals with BMI  Height 5\' 5"   5\' 4"   Weight 172 lbs 3 oz 171 lbs 3 oz 166 lbs  BMI 28.66  28.48  Systolic 116 124 782  Diastolic 76 82 70  Pulse 57 67 79    For best management of weight, it is vital to balance intake versus output. This means the number of calories burned per day must be less than the calories you take in with food and drink.   I recommend trying to follow a diet with the following: Calories: 1200-1500 calories per day Carbohydrates: 150-180 grams of carbohydrates per day  Why: Gives your body enough "quick fuel" for cells to maintain normal function without sending them into starvation mode.  Protein: At least 90 grams of protein per day- 30 grams with each meal Why: Protein takes longer and uses more energy than carbohydrates to break down for fuel. The carbohydrates in your meals serves as quick energy sources and proteins help use some of that extra quick energy to break down to produce long term energy. This helps you not feel hungry as quickly and protein breakdown burns calories.  Water: Drink AT LEAST 64 ounces of water per day  Why: Water is essential to healthy metabolism. Water helps to fill the stomach and keep you fuller longer. Water is required for healthy digestion and filtering of waste in the body.  Fat: Limit fats in your diet- when choosing fats, choose foods with lower fats content such as lean meats (chicken, fish, [place]).  Why: Increased fat intake leads to storage "for later". Once you burn your carbohydrate energy, your body goes into fat and protein breakdown mode to help you loose weight.  Cholesterol: Fats and oils that are LIQUID at room temperature are best. Choose  vegetable oils (olive oil, avocado oil, nuts). Avoid fats that are SOLID at room temperature (animal fats, processed meats). Healthy fats are often found in whole grains, beans, nuts, seeds, and berries.  Why: Elevated cholesterol levels lead to build up of cholesterol on the inside of your blood vessels. This will eventually cause the blood vessels to become hard and can lead to high blood pressure and damage to your organs. When the blood flow is reduced, but the pressure is high from cholesterol buildup, parts of the cholesterol can break off and form clots that can go to the brain or heart leading to a stroke or heart attack.  Fiber: Increase amount of SOLUBLE the fiber in your diet. This helps to fill you up, lowers cholesterol, and helps with digestion. Some foods high in soluble fiber are oats, peas, beans, apples, carrots, barley, and citrus fruits.   Why: Fiber fills you up, helps remove excess cholesterol, and aids in healthy digestion which are all very important in weight management.   I recommend the following as a minimum activity routine: Purposeful walk or other physical activity at least 20 minutes every single day. This means purposefully taking a walk, jog, bike, swim, treadmill, elliptical, dance, etc.  This activity should be ABOVE your normal daily activities, such as walking at work. Goal exercise should be at  least 150 minutes a week- work your way up to this.   Heart Rate: Your maximum exercise heart rate should be 220 - Your Age in Years. When exercising, get your heart rate up, but avoid going over the maximum targeted heart rate.  60-70% of your maximum heart rate is where you tend to burn the most fat. To find this number:  220 - Age In Years= Max HR  Max HR x 0.6 (or 0.7) = Fat Burning HR The Fat Burning HR is your goal heart rate while working out to burn the most fat.  NEVER exercise to the point your feel lightheaded, weak, nauseated, dizzy. If you experience ANY of  these symptoms- STOP exercise! Allow yourself to cool down and your heart rate to come down. Then restart slower next time.  If at ANY TIME you feel chest pain or chest pressure during exercise, STOP IMMEDIATELY and seek medical attention.

## 2022-11-30 NOTE — Progress Notes (Signed)
Shawna Clamp, DNP, AGNP-c [facility] [address] [place], [place] [address] Main Office [phone]  BP 116/76   Pulse (!) 57   Ht 5\' 5"  (1.651 m)   Wt 172 lb 3.2 oz (78.1 kg)   BMI 28.66 kg/m    Subjective:    Patient ID: [name], female    DOB: 04/29/1965, 57 y.o.   MRN: [id number]  HPI: NIAJAH MAUEL is a 57 y.o. female presenting on 11/30/2022 for comprehensive medical examination.   Current medical concerns include: Menopause symptoms:  [name] reports concerns over weight gain in the last year or so. She attributes this to peri-menopause and is very frustrated by the changes she has seen. She is also experiencing frequent hot flashes and some mood fluctuations. Her last period was in November 2023. Her weight is her biggest concern. She is working out extensively and is very active outside of the gym. She is monitoring her diet and has tried numerous changes to help with management without any change.   Needs referral to nutrition services  Pertinent items are noted in HPI.  IMMUNIZATIONS:   Flu Vaccine: Flu vaccine declined, patient will complete later Prevnar 13: Prevnar 13 N/A for this patient Prevnar 20: Prevnar 20 N/A for this patient Pneumovax 23: Pneumovax 23 N/A for this patient Vac Shingrix: Shingrix due, prescription provided HPV: N/A or Aged Out Tetanus: Tetanus completed in the last 10 years COVID: COVID completed, documentation in chart  RSV: No  HEALTH MAINTENANCE: Pap Smear HM Status: is up to date Mammogram HM Status: is up to date Colon Cancer Screening HM Status: is up to date Bone Density HM Status: N/A STI Testing HM Status: was declined  Lung CT HM Status: N/A  Concerns with vision, hearing, or dentition: No  Dietary Habits: Strict dieting Exercise: Working out at a gym three times a week. Private weight lifting sessions.   Most Recent Depression Screen:     11/30/2022    9:12 AM 09/03/2021   10:37 AM  07/01/2020   10:55 AM 08/04/2019    8:32 AM 05/04/2018    8:06 AM  Depression screen PHQ 2/9  Decreased Interest 0 0 0 0 0  Down, Depressed, Hopeless 0 0 0 0 0  PHQ - 2 Score 0 0 0 0 0  Altered sleeping  0 0    Tired, decreased energy  0 0    Change in appetite  0 0    Feeling bad or failure about yourself   0 0    Trouble concentrating  0 0    Moving slowly or fidgety/restless  0 0    Suicidal thoughts  0 0    PHQ-9 Score  0 0    Difficult doing work/chores  Not difficult at all      Most Recent Anxiety Screen:     07/01/2020   10:56 AM  GAD 7 : Generalized Anxiety Score  Nervous, Anxious, on Edge 0  Control/stop worrying 0  Worry too much - different things 1  Trouble relaxing 0  Restless 0  Easily annoyed or irritable 0  Afraid - awful might happen 0  Total GAD 7 Score 1  Anxiety Difficulty Not difficult at all   Most Recent Fall Screen:    11/30/2022    9:12 AM 11/12/2021   10:53 AM 09/03/2021   10:37 AM 07/01/2020   10:55 AM 08/04/2019    8:32 AM  Fall Risk   Falls in the past  year? 0 0 0 0 0  Number falls in past yr: 0 0 0 0   Injury with Fall? 0 0 0    Risk for fall due to : No Fall Risks No Fall Risks No Fall Risks No Fall Risks No Fall Risks  Follow up Falls evaluation completed Education provided;Falls evaluation completed Falls evaluation completed;Education provided  Falls evaluation completed    Past medical history, surgical history, medications, allergies, family history and social history reviewed with patient today and changes made to appropriate areas of the chart.  Past Medical History:  Past Medical History:  Diagnosis Date   Acute hemorrhagic cystitis 11/26/2022   DDD (degenerative disc disease), lumbar    S-I joints   Heart murmur    Hyperlipidemia    Superficial granulomatous pyoderma    Ulcerative colitis    Medications:  Current Outpatient Medications on File Prior to Visit  Medication Sig   cyanocobalamin (VITAMIN B12) 1000 MCG/ML  injection Inject 1 mL (1,000 mcg total) into the muscle every 30 (thirty) days. Please include syringes and IM needles for injections.   rosuvastatin (CRESTOR) 5 MG tablet Take 1 tablet (5 mg total) by mouth daily.   sertraline (ZOLOFT) 50 MG tablet Take 1 tablet (50 mg total) by mouth daily.   Upadacitinib ER (RINVOQ) 30 MG TB24 Take 1 tablet  (30 mg) by mouth daily.   No current facility-administered medications on file prior to visit.   Surgical History:  Past Surgical History:  Procedure Laterality Date   ABDOMINAL HERNIA REPAIR     age 70   COLONOSCOPY  05/12/2012   multiple    POLYPECTOMY     TONSILLECTOMY     Allergies:  Allergies  Allergen Reactions   Doxycycline Rash   Family History:  Family History  Problem Relation Age of Onset   Macular degeneration Father    Lung cancer Father 58   Breast cancer Mother    Hyperlipidemia Mother    Glaucoma Mother    Colon cancer Neg Hx    Colon polyps Neg Hx    Esophageal cancer Neg Hx    Rectal cancer Neg Hx    Stomach cancer Neg Hx        Objective:    BP 116/76   Pulse (!) 57   Ht 5\' 5"  (1.651 m)   Wt 172 lb 3.2 oz (78.1 kg)   BMI 28.66 kg/m   Wt Readings from Last 3 Encounters:  11/30/22 172 lb 3.2 oz (78.1 kg)  11/24/22 171 lb 3.2 oz (77.7 kg)  10/05/22 166 lb (75.3 kg)    Physical Exam Vitals and nursing note reviewed.  Constitutional:      General: She is not in acute distress.    Appearance: Normal appearance.  HENT:     Head: Normocephalic and atraumatic.     Right Ear: Hearing, tympanic membrane, ear canal and external ear normal.     Left Ear: Hearing, tympanic membrane, ear canal and external ear normal.     Nose: Nose normal.     Right Sinus: No maxillary sinus tenderness or frontal sinus tenderness.     Left Sinus: No maxillary sinus tenderness or frontal sinus tenderness.     Mouth/Throat:     Lips: Pink.     Mouth: Mucous membranes are moist.     Pharynx: Oropharynx is clear.  Eyes:      General: Lids are normal. Vision grossly intact.     Extraocular Movements: Extraocular  movements intact.     Conjunctiva/sclera: Conjunctivae normal.     Pupils: Pupils are equal, round, and reactive to light.     Funduscopic exam:    Right eye: Red reflex present.        Left eye: Red reflex present.    Visual Fields: Right eye visual fields normal and left eye visual fields normal.  Neck:     Thyroid: No thyromegaly.     Vascular: No carotid bruit.  Cardiovascular:     Rate and Rhythm: Normal rate and regular rhythm.     Chest Wall: PMI is not displaced.     Pulses: Normal pulses.          Dorsalis pedis pulses are 2+ on the right side and 2+ on the left side.       Posterior tibial pulses are 2+ on the right side and 2+ on the left side.     Heart sounds: Normal heart sounds. No murmur heard. Pulmonary:     Effort: Pulmonary effort is normal. No respiratory distress.     Breath sounds: Normal breath sounds.  Abdominal:     General: Abdomen is flat. Bowel sounds are normal. There is no distension.     Palpations: Abdomen is soft. There is no hepatomegaly, splenomegaly or mass.     Tenderness: There is no abdominal tenderness. There is no right CVA tenderness, left CVA tenderness, guarding or rebound.  Musculoskeletal:        General: Normal range of motion.     Cervical back: Full passive range of motion without pain, normal range of motion and neck supple. No tenderness.     Right lower leg: No edema.     Left lower leg: No edema.  Feet:     Left foot:     Toenail Condition: Left toenails are normal.  Lymphadenopathy:     Cervical: No cervical adenopathy.     Upper Body:     Right upper body: No supraclavicular adenopathy.     Left upper body: No supraclavicular adenopathy.  Skin:    General: Skin is warm and dry.     Capillary Refill: Capillary refill takes less than 2 seconds.     Nails: There is no clubbing.  Neurological:     General: No focal deficit present.      Mental Status: She is alert and oriented to person, place, and time.     GCS: GCS eye subscore is 4. GCS verbal subscore is 5. GCS motor subscore is 6.     Sensory: Sensation is intact.     Motor: Motor function is intact.     Coordination: Coordination is intact.     Gait: Gait is intact.     Deep Tendon Reflexes: Reflexes are normal and symmetric.  Psychiatric:        Attention and Perception: Attention normal.        Mood and Affect: Mood normal.        Speech: Speech normal.        Behavior: Behavior normal. Behavior is cooperative.        Thought Content: Thought content normal.        Cognition and Memory: Cognition and memory normal.        Judgment: Judgment normal.     Results for orders placed or performed in visit on 11/24/22  POCT URINALYSIS DIP (CLINITEK)  Result Value Ref Range   Color, UA yellow yellow   Clarity, UA clear  clear   Glucose, UA negative negative mg/dL   Bilirubin, UA negative negative   Ketones, POC UA negative negative mg/dL   Spec Grav, UA 1.610 9.604 - 1.025   Blood, UA negative negative   pH, UA 6.0 5.0 - 8.0   POC PROTEIN,UA negative negative, trace   Urobilinogen, UA 0.2 (A) 0.2 or 1.0 E.U./dL   Nitrite, UA Negative Negative   Leukocytes, UA Trace (A) Negative         Assessment & Plan:   Problem List Items Addressed This Visit     LEFT SIDED ULCERATIVE COLITIS    Chronic. Currently managed by Dr. Rhea Belton. No symptoms present at this time.       Relevant Orders   CBC with Differential/Platelet   CMP14+EGFR   Hemoglobin A1c   Lipid panel   TSH   VITAMIN D 25 Hydroxy (Vit-D Deficiency, Fractures)   Vitamin B12   BMI 28.0-28.9,adult    Increased weight in the setting of peri-menopausal symptoms despite significant diet and exercise changes. She is unable to take hormones due to history of hormone positive breast cancer in her mother in her 50's. She did have success with saxenda in the past, but her insurance is no longer covering  these weight management medications. Her weight changes do not correlate with her activity levels and dietary habits.  Plan: - Referral sent to nutrition services to aid in healthy dietary habits for weight management.  - Information provided on AVS.       Relevant Orders   CBC with Differential/Platelet   CMP14+EGFR   Hemoglobin A1c   Lipid panel   TSH   VITAMIN D 25 Hydroxy (Vit-D Deficiency, Fractures)   Vitamin B12   Referral to Nutrition and Diabetes Services   Mixed hyperlipidemia    Chronic. Lipids to be monitored today. No alarm symptoms present at this time. LDL Goal < 100. Controlled with  rosuvastatin 5mg  every other day dosing .  Plan: Continue current lipid-lowering therapy. orders written for new lab studies as appropriate; see orders Recommend Strict diet and exercise Diet and exercise recommendations provided.  Follow-up in 12months or sooner based on lab findings as appropriate.         Relevant Orders   CBC with Differential/Platelet   CMP14+EGFR   Hemoglobin A1c   Lipid panel   TSH   VITAMIN D 25 Hydroxy (Vit-D Deficiency, Fractures)   Vitamin B12   Immunocompromised (HCC)    Chronic in the setting of treatment for Crohn's disease. She plans to receive her flu vaccine next month at work. No symptoms of infection present at this time.       Relevant Orders   CBC with Differential/Platelet   CMP14+EGFR   Hemoglobin A1c   Lipid panel   TSH   VITAMIN D 25 Hydroxy (Vit-D Deficiency, Fractures)   Vitamin B12   Encounter for annual physical exam - Primary    CPE completed today. Review of HM activities and recommendations discussed and provided on AVS. Anticipatory guidance, diet, and exercise recommendations provided. Medications, allergies, and hx reviewed and updated as necessary. Orders placed as listed below.  Plan: - Labs ordered. Will make changes as necessary based on results.  - I will review these results and send recommendations via MyChart or  a telephone call.  - F/U with CPE in 1 year or sooner for acute/chronic health needs as directed.        Relevant Orders   CBC with Differential/Platelet  CMP14+EGFR   Hemoglobin A1c   Lipid panel   TSH   VITAMIN D 25 Hydroxy (Vit-D Deficiency, Fractures)   Vitamin B12   B12 deficiency    Chronic. Currently on replacement injections. Monitoring today      Relevant Orders   CBC with Differential/Platelet   CMP14+EGFR   Hemoglobin A1c   Lipid panel   TSH   VITAMIN D 25 Hydroxy (Vit-D Deficiency, Fractures)   Vitamin B12   Coronary atherosclerosis due to calcified coronary lesion of native artery    Currently on statin therapy and working on diet and exercise. Labs pending.       Relevant Orders   CBC with Differential/Platelet   CMP14+EGFR   Hemoglobin A1c   Lipid panel   TSH   VITAMIN D 25 Hydroxy (Vit-D Deficiency, Fractures)   Vitamin B12   Menopause syndrome   Vitamin D deficiency    Chronic. Will monitor labs today to determine if high dose treatment is needed.       Relevant Orders   CBC with Differential/Platelet   CMP14+EGFR   Hemoglobin A1c   Lipid panel   TSH   VITAMIN D 25 Hydroxy (Vit-D Deficiency, Fractures)   Vitamin B12       Follow up plan: Return in about 1 year (around 11/30/2023) for CPE.  NEXT PREVENTATIVE PHYSICAL DUE IN 1 YEAR.  PATIENT COUNSELING PROVIDED FOR ALL ADULT PATIENTS: A well balanced diet low in saturated fats, cholesterol, and moderation in carbohydrates.  This can be as simple as monitoring portion sizes and cutting back on sugary beverages such as soda and juice to start with.    Daily water consumption of at least 64 ounces.  Physical activity at least 180 minutes per week.  If just starting out, start 10 minutes a day and work your way up.   This can be as simple as taking the stairs instead of the elevator and walking 2-3 laps around the office  purposefully every day.   STD protection, partner selection, and  regular testing if high risk.  Limited consumption of alcoholic beverages if alcohol is consumed. For men, I recommend no more than 14 alcoholic beverages per week, spread out throughout the week (max 2 per day). Avoid "binge" drinking or consuming large quantities of alcohol in one setting.  Please let me know if you feel you may need help with reduction or quitting alcohol consumption.   Avoidance of nicotine, if used. Please let me know if you feel you may need help with reduction or quitting nicotine use.   Daily mental health attention. This can be in the form of 5 minute daily meditation, prayer, journaling, yoga, reflection, etc.  Purposeful attention to your emotions and mental state can significantly improve your overall wellbeing  and  Health.  Please know that I am here to help you with all of your health care goals and am happy to work with you to find a solution that works best for you.  The greatest advice I have received with any changes in life are to take it one step at a time, that even means if all you can focus on is the next 60 seconds, then do that and celebrate your victories.  With any changes in life, you will have set backs, and that is OK. The important thing to remember is, if you have a set back, it is not a failure, it is an opportunity to try again! Screening Testing Mammogram Every 1 -  2 years based on history and risk factors Starting at age 17 Pap Smear Ages 21-39 every 3 years Ages 53-65 every 5 years with HPV testing More frequent testing may be required based on results and history Colon Cancer Screening Every 1-10 years based on test performed, risk factors, and history Starting at age 25 Bone Density Screening Every 2-10 years based on history Starting at age 51 for women Recommendations for men differ based on medication usage, history, and risk factors AAA Screening One time ultrasound Men 18-59 years old who have every smoked Lung Cancer  Screening Low Dose Lung CT every 12 months Age 35-80 years with a 30 pack-year smoking history who still smoke or who have quit within the last 15 years   Screening Labs Routine  Labs: Complete Blood Count (CBC), Complete Metabolic Panel (CMP), Cholesterol (Lipid Panel) Every 6-12 months based on history and medications May be recommended more frequently based on current conditions or previous results Hemoglobin A1c Lab Every 3-12 months based on history and previous results Starting at age 50 or earlier with diagnosis of diabetes, high cholesterol, BMI >26, and/or risk factors Frequent monitoring for patients with diabetes to ensure blood sugar control Thyroid Panel (TSH) Every 6 months based on history, symptoms, and risk factors May be repeated more often if on medication HIV One time testing for all patients 55 and older May be repeated more frequently for patients with increased risk factors or exposure Hepatitis C One time testing for all patients 18 and older May be repeated more frequently for patients with increased risk factors or exposure Gonorrhea, Chlamydia Every 12 months for all sexually active persons 13-24 years Additional monitoring may be recommended for those who are considered high risk or who have symptoms Every 12 months for any woman on birth control, regardless of sexual activity PSA Men 54-5 years old with risk factors Additional screening may be recommended from age 54-69 based on risk factors, symptoms, and history  Vaccine Recommendations Tetanus Booster All adults every 10 years Flu Vaccine All patients 6 months and older every year COVID Vaccine All patients 12 years and older Initial dosing with booster May recommend additional booster based on age and health history HPV Vaccine 2 doses all patients age 56-26 Dosing may be considered for patients over 26 Shingles Vaccine (Shingrix) 2 doses all adults 55 years and older Pneumonia (Pneumovax  85) All adults 65 years and older May recommend earlier dosing based on health history One year apart from Prevnar 39 Pneumonia (Prevnar 42) All adults 65 years and older Dosed 1 year after Pneumovax 23 Pneumonia (Prevnar 20) One time alternative to the two dosing of 13 and 23 For all adults with initial dose of 23, 20 is recommended 1 year later For all adults with initial dose of 13, 23 is still recommended as second option 1 year later

## 2022-11-30 NOTE — Assessment & Plan Note (Signed)
Chronic. Will monitor labs today to determine if high dose treatment is needed.

## 2022-11-30 NOTE — Assessment & Plan Note (Signed)
Chronic in the setting of treatment for Crohn's disease. She plans to receive her flu vaccine next month at work. No symptoms of infection present at this time.

## 2022-11-30 NOTE — Assessment & Plan Note (Signed)
Chronic. Currently managed by Dr. Rhea Belton. No symptoms present at this time.

## 2022-12-01 LAB — CMP14+EGFR
ALT: 17 IU/L (ref 0–32)
AST: 23 IU/L (ref 0–40)
Albumin: 4.6 g/dL (ref 3.8–4.9)
Alkaline Phosphatase: 51 IU/L (ref 44–121)
BUN/Creatinine Ratio: 28 — ABNORMAL HIGH (ref 9–23)
BUN: 19 mg/dL (ref 6–24)
Bilirubin Total: 0.4 mg/dL (ref 0.0–1.2)
CO2: 24 mmol/L (ref 20–29)
Calcium: 9.4 mg/dL (ref 8.7–10.2)
Chloride: 105 mmol/L (ref 96–106)
Creatinine, Ser: 0.68 mg/dL (ref 0.57–1.00)
Globulin, Total: 2.4 g/dL (ref 1.5–4.5)
Glucose: 87 mg/dL (ref 70–99)
Potassium: 4.8 mmol/L (ref 3.5–5.2)
Sodium: 140 mmol/L (ref 134–144)
Total Protein: 7 g/dL (ref 6.0–8.5)
eGFR: 103 mL/min/{1.73_m2} (ref >59)

## 2022-12-01 LAB — CBC WITH DIFFERENTIAL/PLATELET
Basophils Absolute: 0 10*3/uL (ref 0.0–0.2)
Basos: 1 %
EOS (ABSOLUTE): 0.1 10*3/uL (ref 0.0–0.4)
Eos: 1 %
Hematocrit: 37.5 % (ref 34.0–46.6)
Hemoglobin: 12.3 g/dL (ref 11.1–15.9)
Immature Grans (Abs): 0 10*3/uL (ref 0.0–0.1)
Immature Granulocytes: 1 %
Lymphocytes Absolute: 1.5 10*3/uL (ref 0.7–3.1)
Lymphs: 30 %
MCH: 30.6 pg (ref 26.6–33.0)
MCHC: 32.8 g/dL (ref 31.5–35.7)
MCV: 93 fL (ref 79–97)
Monocytes Absolute: 0.5 10*3/uL (ref 0.1–0.9)
Monocytes: 10 %
Neutrophils Absolute: 2.8 10*3/uL (ref 1.4–7.0)
Neutrophils: 57 %
Platelets: 327 10*3/uL (ref 150–450)
RBC: 4.02 x10E6/uL (ref 3.77–5.28)
RDW: 12.4 % (ref 11.7–15.4)
WBC: 4.9 10*3/uL (ref 3.4–10.8)

## 2022-12-01 LAB — TSH: TSH: 1.2 u[IU]/mL (ref 0.450–4.500)

## 2022-12-01 LAB — LIPID PANEL
Chol/HDL Ratio: 2.9 ratio (ref 0.0–4.4)
Cholesterol, Total: 194 mg/dL (ref 100–199)
HDL: 67 mg/dL (ref >39)
LDL Chol Calc (NIH): 105 mg/dL — ABNORMAL HIGH (ref 0–99)
Triglycerides: 123 mg/dL (ref 0–149)
VLDL Cholesterol Cal: 22 mg/dL (ref 5–40)

## 2022-12-01 LAB — HEMOGLOBIN A1C
Est. average glucose Bld gHb Est-mCnc: 117 mg/dL
Hgb A1c MFr Bld: 5.7 % — ABNORMAL HIGH (ref 4.8–5.6)

## 2022-12-01 LAB — VITAMIN B12: Vitamin B-12: 408 pg/mL (ref 232–1245)

## 2022-12-01 LAB — VITAMIN D 25 HYDROXY (VIT D DEFICIENCY, FRACTURES): Vit D, 25-Hydroxy: 36.9 ng/mL (ref 30.0–100.0)

## 2022-12-08 NOTE — Addendum Note (Signed)
Addended by: Dayn Barich, Huntley Dec E on: 12/08/2022 07:14 AM   Modules accepted: Orders

## 2022-12-09 ENCOUNTER — Other Ambulatory Visit (HOSPITAL_COMMUNITY): Payer: Self-pay

## 2022-12-09 ENCOUNTER — Encounter: Payer: Self-pay | Admitting: Nurse Practitioner

## 2022-12-09 ENCOUNTER — Other Ambulatory Visit: Payer: Self-pay | Admitting: Obstetrics and Gynecology

## 2022-12-09 ENCOUNTER — Ambulatory Visit: Payer: 59 | Attending: Nurse Practitioner | Admitting: Pharmacist

## 2022-12-09 ENCOUNTER — Ambulatory Visit (INDEPENDENT_AMBULATORY_CARE_PROVIDER_SITE_OTHER): Payer: 59 | Admitting: Nurse Practitioner

## 2022-12-09 VITALS — BP 124/80 | HR 68 | Wt 172.0 lb

## 2022-12-09 DIAGNOSIS — I2510 Atherosclerotic heart disease of native coronary artery without angina pectoris: Secondary | ICD-10-CM | POA: Diagnosis not present

## 2022-12-09 DIAGNOSIS — R300 Dysuria: Secondary | ICD-10-CM

## 2022-12-09 DIAGNOSIS — R3989 Other symptoms and signs involving the genitourinary system: Secondary | ICD-10-CM | POA: Diagnosis not present

## 2022-12-09 DIAGNOSIS — E782 Mixed hyperlipidemia: Secondary | ICD-10-CM | POA: Diagnosis not present

## 2022-12-09 DIAGNOSIS — R7303 Prediabetes: Secondary | ICD-10-CM | POA: Insufficient documentation

## 2022-12-09 DIAGNOSIS — Z79899 Other long term (current) drug therapy: Secondary | ICD-10-CM

## 2022-12-09 DIAGNOSIS — Z6828 Body mass index (BMI) 28.0-28.9, adult: Secondary | ICD-10-CM

## 2022-12-09 DIAGNOSIS — Z1231 Encounter for screening mammogram for malignant neoplasm of breast: Secondary | ICD-10-CM

## 2022-12-09 DIAGNOSIS — I2584 Coronary atherosclerosis due to calcified coronary lesion: Secondary | ICD-10-CM | POA: Diagnosis not present

## 2022-12-09 HISTORY — DX: Other symptoms and signs involving the genitourinary system: R39.89

## 2022-12-09 LAB — POCT URINALYSIS DIP (CLINITEK)
Bilirubin, UA: NEGATIVE
Blood, UA: NEGATIVE
Glucose, UA: NEGATIVE mg/dL
Ketones, POC UA: NEGATIVE mg/dL
Leukocytes, UA: NEGATIVE
Nitrite, UA: NEGATIVE
POC PROTEIN,UA: NEGATIVE
Spec Grav, UA: 1.01 (ref 1.010–1.025)
Urobilinogen, UA: 0.2 E.U./dL
pH, UA: 6 (ref 5.0–8.0)

## 2022-12-09 MED ORDER — PHENAZOPYRIDINE HCL 200 MG PO TABS
200.0000 mg | ORAL_TABLET | Freq: Three times a day (TID) | ORAL | 0 refills | Status: DC | PRN
  Filled 2022-12-09: qty 10, 4d supply, fill #0

## 2022-12-09 MED ORDER — WEGOVY 0.25 MG/0.5ML ~~LOC~~ SOAJ
0.2500 mg | SUBCUTANEOUS | 0 refills | Status: DC
  Filled 2022-12-09 – 2022-12-14 (×2): qty 2, 28d supply, fill #0

## 2022-12-09 NOTE — Progress Notes (Signed)
  S: Patient presents today for review of their specialty medication.   Patient is currently taking Rinvoq for UC. Patient is managed by Dr. Rhea Belton for this.   Dosing: Adult  Oral: 15 mg once daily.  Adherence: confirms  Efficacy: reports that the medication is working very well for her!  Monitoring: S/sx thromboembolism: none, no prior hx of VTE.  S/sx malignancy: none  S/sx of infection: none   Current adverse effects: none   O:     Lab Results  Component Value Date   WBC 4.9 11/30/2022   HGB 12.3 11/30/2022   HCT 37.5 11/30/2022   MCV 93 11/30/2022   PLT 327 11/30/2022      Chemistry      Component Value Date/Time   NA 140 11/30/2022 1029   K 4.8 11/30/2022 1029   CL 105 11/30/2022 1029   CO2 24 11/30/2022 1029   BUN 19 11/30/2022 1029   CREATININE 0.68 11/30/2022 1029   CREATININE 0.62 03/22/2020 1040      Component Value Date/Time   CALCIUM 9.4 11/30/2022 1029   ALKPHOS 51 11/30/2022 1029   AST 23 11/30/2022 1029   ALT 17 11/30/2022 1029   BILITOT 0.4 11/30/2022 1029       Lab Results  Component Value Date   CHOL 194 11/30/2022   HDL 67 11/30/2022   LDLCALC 105 (H) 11/30/2022   LDLDIRECT 142.8 03/03/2010   TRIG 123 11/30/2022   CHOLHDL 2.9 11/30/2022     A/P: 1. Medication review: patient currently taking Rinvoq for rheumatoid arthritis. Reviewed the medication with the patient, including the following: Rinvoq is a medication used to treat rheumatoid arthritis. Administer with or without food. Swallow tablet whole; do not crush, split, or chew. Possible adverse effects include increased risk of infection, GI upset, hematologic toxicity, hepatic effects, lipid abnormalities, increased risk of malignancy, thromboembolism. Avoid live vaccinations. No recommendations for any changes.  Butch Penny, PharmD, Patsy Baltimore, CPP Clinical Pharmacist [facility] [phone]

## 2022-12-09 NOTE — Progress Notes (Signed)
Tollie Eth, DNP, AGNP-c [facility] [address] [place], [place] [address] [phone]   ACUTE VISIT- ESTABLISHED PATIENT  Blood pressure 124/80, pulse 68, weight 172 lb (78 kg).  Subjective:  HPI [name] is a 57 y.o. female presents to day for evaluation of acute concern(s).    History of Present Illness   [name] presents with recurrent bladder pain and increased urinary frequency. She describes the discomfort as a dull ache in the suprapubic region, which intensifies upon bladder filling and is relieved upon voiding. The patient denies dysuria, hematuria, and urinary incontinence. She also denies any vaginal itching or pain during sexual intercourse. Of note, she had similar symptoms within the last 3 weeks with hematuria present and a sensation of urinary blockage. This resolved with ciprofloxacin.  Her recent lab results showed good kidney function and no signs of infection or immune concerns. She is having normal bowel movements daily and has not had a flair of her UC in months.    Her recent labs show elevation in her A1c to the level of pre-diabetes. She also expresses concerns with steady increased weight despite strict diet and exercise regimen with low/no carb and high protein options. She is working with a Psychologist, educational several days a week and has been for over 1 year. Her most recent evaluation with the trainer showed that her muscle mass was stable, but her body fat percentage had increased. She is concerned with comorbid conditions of HLD and UC in the setting of pre-diabetes.       ROS negative except for what is listed in HPI. History, Medications, Surgery, SDOH, and Family History reviewed and updated as appropriate.  Objective:  Physical Exam Vitals and nursing note reviewed.  Constitutional:      Appearance: Normal appearance.  HENT:     Head: Normocephalic.  Eyes:     Conjunctiva/sclera: Conjunctivae normal.  Cardiovascular:     Rate and  Rhythm: Regular rhythm.  Pulmonary:     Effort: Pulmonary effort is normal.  Abdominal:     General: Abdomen is flat. Bowel sounds are normal. There is no distension.     Palpations: Abdomen is soft. There is no mass.     Tenderness: There is abdominal tenderness in the suprapubic area. There is no right CVA tenderness, left CVA tenderness, guarding or rebound.     Hernia: No hernia is present.  Skin:    General: Skin is warm and dry.     Capillary Refill: Capillary refill takes less than 2 seconds.  Neurological:     General: No focal deficit present.     Mental Status: She is alert and oriented to person, place, and time.  Psychiatric:        Mood and Affect: Mood normal.        Behavior: Behavior normal.         Assessment & Plan:   Problem List Items Addressed This Visit     Bladder pain - Primary    Recurrent bladder pain with increased urinary frequency, no infection present on UA. Possible interstitial cystitis discussed. Given her most recent symptoms prior to this, I am concerned for a recurrent condition.  -Send urine for culture to rule out any unusual growth. -Trial of Pyridium to manage bladder spasms and discomfort. -Consider referral to urology if symptoms persist. -Possible ultrasound to rule out fibroids or other structural causes if symptoms return.      Relevant Medications   phenazopyridine (PYRIDIUM) 200  MG tablet   Other Relevant Orders   Urine Culture   Pre-diabetes    Elevated A1c to prediabetic level and difficulty losing weight despite active lifestyle and healthy diet.  She has increased risks of development of diabetes with steadily increasing A1c despite strict diet and exercise regimen and history of HLD and Coronary artery atherosclerosis. We will try to get approval for GLP-1 given her BMI, A1c and Fasting blood sugars, and lipids.  -Attempt to get approval for GLP-1 to manage insulin resistance. -Continue current diet and exercise  regimen. -Check A1c in six months.      Relevant Medications   Semaglutide-Weight Management (WEGOVY) 0.25 MG/0.5ML SOAJ   BMI 28.0-28.9,adult   Relevant Medications   Semaglutide-Weight Management (WEGOVY) 0.25 MG/0.5ML SOAJ   Mixed hyperlipidemia   Relevant Medications   Semaglutide-Weight Management (WEGOVY) 0.25 MG/0.5ML SOAJ   Coronary atherosclerosis due to calcified coronary lesion of native artery   Relevant Medications   Semaglutide-Weight Management (WEGOVY) 0.25 MG/0.5ML SOAJ   Other Visit Diagnoses     Burning with urination       Relevant Medications   phenazopyridine (PYRIDIUM) 200 MG tablet   Other Relevant Orders   POCT URINALYSIS DIP (CLINITEK) (Completed)   Urine Culture       Tollie Eth, DNP, AGNP-c

## 2022-12-09 NOTE — Assessment & Plan Note (Signed)
Elevated A1c to prediabetic level and difficulty losing weight despite active lifestyle and healthy diet.  She has increased risks of development of diabetes with steadily increasing A1c despite strict diet and exercise regimen and history of HLD and Coronary artery atherosclerosis. We will try to get approval for GLP-1 given her BMI, A1c and Fasting blood sugars, and lipids.  -Attempt to get approval for GLP-1 to manage insulin resistance. -Continue current diet and exercise regimen. -Check A1c in six months.

## 2022-12-09 NOTE — Assessment & Plan Note (Signed)
Recurrent bladder pain with increased urinary frequency, no infection present on UA. Possible interstitial cystitis discussed. Given her most recent symptoms prior to this, I am concerned for a recurrent condition.  -Send urine for culture to rule out any unusual growth. -Trial of Pyridium to manage bladder spasms and discomfort. -Consider referral to urology if symptoms persist. -Possible ultrasound to rule out fibroids or other structural causes if symptoms return.

## 2022-12-09 NOTE — Patient Instructions (Addendum)
Let me know if the symptoms do not get better, return, or new symptoms start.     The following lab(s) are normal or show no significant abnormalities: white blood counts (CBC), red blood counts (CBC), electrolytes (sodium, potassium, calcium), kidney function (eGFR, BUN, creatinine), cholesterol (lipids), liver function (ALT, AST, bilirubin), proteins (albumin, globulin, protein), thyroid (TSH), and vitamin B12. Based on these findings, we do not need to change anything.   Your A1c is slightly elevated at the level of pre-diabetes. This may be a contributor of the weight gain you are experiencing. When the average blood sugars are elevated, this indicates that some of the cells in your body are insulin resistant.   Sugar (glucose) in our blood should naturally go to feed the cells of our body. Insulin acts as a key to open the cell and allow the sugar to enter. When you have insulin resistance, some of the cells change the locks and the insulin can no longer open the cell. This results in sugar being unused and staying in the blood. This also causes some of your cells to be "hungry". Your brain cannot see there is excess sugar in the blood, it can only see the "hungry cells". The brain thinks that you are not getting enough food and sets off a series of events to try to help feed the hungry cells.   The brain triggers cravings for foods that have simple sugar/fast carbohydrates (soda, chips, cookies, bread, etc). The brain also releases stored sugar from the liver, which raises your blood sugar more. The brain also triggers all calories that are not burned off with exercise to store themselves as fat as an effort to protect you from starving.   Unfortunately, this series of events only works as expected when you are truly starving and not when there is insulin resistance present. The excess storage of fat actually leads to more insulin resistance and the spilling of blood sugar and cravings only  result in higher blood sugars.   There are several ways to combat this: 1- diet Eat small, frequent meals/snacks throughout the day with a balance of carbohydrates, protein, and fiber. This helps to keep a more stable blood sugar and prevents an excess of calories to be stored at one time.   2- exercise Taking a brisk walk or getting regular daily exercise helps to burn off extra calories that would normally be stored as fat. Making a habit to take a 10 minute walk after eating can be very effective.   3- drink water Removing juice, soda, and sports drinks from your diet and replacing with water reduces the amount of empty calories and excess sugar intake. Water helps to filter the extra sugar out of the blood faster.   4- medications There are medications that can be used to help manage your blood sugars. Diet and exercise are good first options for pre-diabetes, but when this does not work, we can add low doses of some diabetes medications to help regulate the body's sugar level.   For now, I feel we can continue to manage with diet and exercise, as your levels are low. We will plan to keep an eye on this, though, and can consider medication in the future. If you want to consider medication now, please let me know.   Your vitamin D is in the normal range, but a touch lower than I would like to see it. If you are not taking a vitamin D supplement, I recommend adding  1000-2000 I.U. daily to your medications.     WEIGHT LOSS PLANNING Your progress today shows:     12/09/2022    8:48 AM 11/30/2022    9:12 AM 11/24/2022   10:30 AM  Vitals with BMI  Height  5\' 5"    Weight 172 lbs 172 lbs 3 oz 171 lbs 3 oz  BMI 28.62 28.66   Systolic 124 116 295  Diastolic 80 76 82  Pulse 68 57 67    For best management of weight, it is vital to balance intake versus output. This means the number of calories burned per day must be less than the calories you take in with food and drink.   I  recommend trying to follow a diet with the following: Calories: 1200-1500 calories per day Carbohydrates: 150-180 grams of carbohydrates per day  Why: Gives your body enough "quick fuel" for cells to maintain normal function without sending them into starvation mode.  Protein: At least 90 grams of protein per day- 30 grams with each meal Why: Protein takes longer and uses more energy than carbohydrates to break down for fuel. The carbohydrates in your meals serves as quick energy sources and proteins help use some of that extra quick energy to break down to produce long term energy. This helps you not feel hungry as quickly and protein breakdown burns calories.  Water: Drink AT LEAST 64 ounces of water per day  Why: Water is essential to healthy metabolism. Water helps to fill the stomach and keep you fuller longer. Water is required for healthy digestion and filtering of waste in the body.  Fat: Limit fats in your diet- when choosing fats, choose foods with lower fats content such as lean meats (chicken, fish, [place]).  Why: Increased fat intake leads to storage "for later". Once you burn your carbohydrate energy, your body goes into fat and protein breakdown mode to help you loose weight.  Cholesterol: Fats and oils that are LIQUID at room temperature are best. Choose vegetable oils (olive oil, avocado oil, nuts). Avoid fats that are SOLID at room temperature (animal fats, processed meats). Healthy fats are often found in whole grains, beans, nuts, seeds, and berries.  Why: Elevated cholesterol levels lead to build up of cholesterol on the inside of your blood vessels. This will eventually cause the blood vessels to become hard and can lead to high blood pressure and damage to your organs. When the blood flow is reduced, but the pressure is high from cholesterol buildup, parts of the cholesterol can break off and form clots that can go to the brain or heart leading to a stroke or heart attack.  Fiber:  Increase amount of SOLUBLE the fiber in your diet. This helps to fill you up, lowers cholesterol, and helps with digestion. Some foods high in soluble fiber are oats, peas, beans, apples, carrots, barley, and citrus fruits.   Why: Fiber fills you up, helps remove excess cholesterol, and aids in healthy digestion which are all very important in weight management.   I recommend the following as a minimum activity routine: Purposeful walk or other physical activity at least 20 minutes every single day. This means purposefully taking a walk, jog, bike, swim, treadmill, elliptical, dance, etc.  This activity should be ABOVE your normal daily activities, such as walking at work. Goal exercise should be at least 150 minutes a week- work your way up to this.   Heart Rate: Your maximum exercise heart rate should be 220 -  Your Age in Years. When exercising, get your heart rate up, but avoid going over the maximum targeted heart rate.  60-70% of your maximum heart rate is where you tend to burn the most fat. To find this number:  220 - Age In Years= Max HR  Max HR x 0.6 (or 0.7) = Fat Burning HR The Fat Burning HR is your goal heart rate while working out to burn the most fat.  NEVER exercise to the point your feel lightheaded, weak, nauseated, dizzy. If you experience ANY of these symptoms- STOP exercise! Allow yourself to cool down and your heart rate to come down. Then restart slower next time.  If at ANY TIME you feel chest pain or chest pressure during exercise, STOP IMMEDIATELY and seek medical attention.

## 2022-12-10 ENCOUNTER — Other Ambulatory Visit (HOSPITAL_COMMUNITY): Payer: Self-pay

## 2022-12-13 ENCOUNTER — Other Ambulatory Visit: Payer: Self-pay | Admitting: Nurse Practitioner

## 2022-12-13 DIAGNOSIS — A498 Other bacterial infections of unspecified site: Secondary | ICD-10-CM

## 2022-12-13 MED ORDER — NITROFURANTOIN MONOHYD MACRO 100 MG PO CAPS
100.0000 mg | ORAL_CAPSULE | Freq: Two times a day (BID) | ORAL | 0 refills | Status: DC
  Filled 2022-12-13: qty 14, 7d supply, fill #0

## 2022-12-14 ENCOUNTER — Other Ambulatory Visit: Payer: Self-pay

## 2022-12-14 ENCOUNTER — Other Ambulatory Visit (HOSPITAL_COMMUNITY): Payer: Self-pay

## 2022-12-14 ENCOUNTER — Telehealth: Payer: Self-pay | Admitting: Nurse Practitioner

## 2022-12-14 NOTE — Telephone Encounter (Signed)
[name] is needing a PA on Wegovy.

## 2022-12-14 NOTE — Telephone Encounter (Signed)
Pt.notified

## 2022-12-14 NOTE — Telephone Encounter (Addendum)
This drug/product is not covered under the pharmacy benefit. Prior Authorization is not available.

## 2022-12-15 LAB — URINE CULTURE

## 2022-12-18 ENCOUNTER — Other Ambulatory Visit: Payer: Self-pay

## 2022-12-18 NOTE — Progress Notes (Signed)
Specialty Pharmacy Refill Coordination Note  [name] is a 57 y.o. female contacted today regarding refills of specialty medication(s) Upadacitinib   Patient requested Pickup at [facility] at [place] date: 12/23/22   Medication will be filled on 12/22/22.

## 2022-12-21 ENCOUNTER — Other Ambulatory Visit (HOSPITAL_COMMUNITY): Payer: Self-pay

## 2022-12-21 ENCOUNTER — Other Ambulatory Visit (HOSPITAL_BASED_OUTPATIENT_CLINIC_OR_DEPARTMENT_OTHER): Payer: Self-pay | Admitting: Nurse Practitioner

## 2022-12-21 MED ORDER — SERTRALINE HCL 50 MG PO TABS
50.0000 mg | ORAL_TABLET | Freq: Every day | ORAL | 1 refills | Status: DC
  Filled 2022-12-21 – 2023-04-18 (×3): qty 90, 90d supply, fill #0
  Filled 2023-07-20: qty 90, 90d supply, fill #1

## 2022-12-22 ENCOUNTER — Other Ambulatory Visit (HOSPITAL_COMMUNITY): Payer: Self-pay

## 2022-12-23 ENCOUNTER — Other Ambulatory Visit (HOSPITAL_COMMUNITY): Payer: Self-pay

## 2022-12-23 ENCOUNTER — Ambulatory Visit: Payer: 59 | Admitting: Dietician

## 2022-12-23 ENCOUNTER — Encounter: Payer: Self-pay | Admitting: Dietician

## 2022-12-23 ENCOUNTER — Encounter: Payer: 59 | Attending: Nurse Practitioner | Admitting: Dietician

## 2022-12-23 DIAGNOSIS — R7303 Prediabetes: Secondary | ICD-10-CM | POA: Insufficient documentation

## 2022-12-23 DIAGNOSIS — E663 Overweight: Secondary | ICD-10-CM | POA: Insufficient documentation

## 2022-12-23 NOTE — Progress Notes (Signed)
Medical Nutrition Therapy  Appointment Start time:  08:00  Appointment End time:  09:00  Referral diagnosis: E66.3 (ICD-10-CM) - Overweight  Referring Provider: Tollie Eth, NP  Preferred learning style: no preference indicated Learning readiness: ready   Primary Concerns:  [name] arrives today endorsing this is first visit with a dietitian. She stated that primary concerns today are for elevated cholesterol, A1C, and weight gain. Pt endorses that she has familial history of elevated cholesterol (and having higher levels is normal for her and that it was well controlled "if weight was maintained")- however, it is noted that taking Rinvoq for her UC may also cause increase in cholesterol- now taking Crestor to help with this.  Pt states that UC is in remission and she follows a normal diet, she only avoids drinking milk but will have yogurt/cottage cheese and cheese without noticeable discomfort.  Pt states that with current health concerns, she feels uncertain on what is considered "okay" to eat, and feels pressured from friends/colleagues to avoid/focus on certain foods and food groups (ex: avoiding carbohydrates); feels that she needs to give herself permission to eat the things that others say not to eat; wants to talk about eating plant sources of protein.  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 Typical work day 5 am - 8 pm; lunch usually 1-2 pm, may not always have breakfast, and late dinner. Recently trying to bring dinner with her to eat around 5 pm.  Says has a sweet tooth and tryting to stick with desert once a week- loves to bake.  NUTRITION ASSESSMENT   Anthropometrics  Ht:  Wt: pt reported: 165lb  Clinical Medical Hx: UC Medications: See medications list Labs:   Latest Reference Range & Units Most Recent  Cholesterol 0 - 200 mg/dL 161 (H) 0/96/04 54:09   Cholesterol, Total 100 - 199 mg/dL 811 11/27/76 29:56  HDL Cholesterol >39 mg/dL 67 04/28/06 65:78  LDL (calc) 0 - 99 mg/dL 469 (H) 09/12/50 84:13  Direct LDL mg/dL 244.0 01/10/24 36:64  LDL Cholesterol (Calc) mg/dL (calc) 403 (H) 06/20/40 59:56    Latest Reference Range & Units Most Recent  Hemoglobin A1C 4.8 - 5.6 % 5.7 (H) 11/30/22 10:29  (H): Data is abnormally high  Notable Signs/Symptoms: currently constipated (going every other day) for the last year or so. Food Allergies: none Avoidances: milk   Lifestyle & Dietary Hx Physicall activity: Works out 3x a week with weights and adding cardio. Exercise class with group. Weekends are mostly yard works as she takes of two house holds. Walks dogs a lot.  Estimated daily fluid intake: 64+ oz Supplements: - Sleep: - Stress / self-care: high stress at home, busy job  24-Hr Dietary Recall First Meal: apple with slices of cheese Snack: - Second Meal: chicken salad (greek yogurt, mayo, onion, spices) Snack: - Third Meal: beef vegetable soup Snack: chocolate granola bar OR piece of fruit Beverages: -  Typical beverages: tea with collagen powder and sugar free creamer, water with true lemon (sugar free), diet pepsi 1 a day.   NUTRITION DIAGNOSIS  NB-1.1 Food and nutrition-related knowledge deficit As related to lack of prior food and nutrition education.  As evidenced by pt endorsed not having previously received food and nutrition counseling form and RD.   NUTRITION INTERVENTION  Nutrition education (E-1) on the following topics:  - RD  educated the pt on the importance and role of different food groups in the diet; RD encouraged pt to try to maintain a diet that is inclusive of all food groups Fruits & Vegetables: Aim to fill half your plate with a variety of fruits and vegetables. They are rich in vitamins, minerals, and fiber, and can help reduce the risk of chronic diseases. Choose a colorful assortment of fruits and  vegetables to ensure you get a wide range of nutrients. Grains and Starches: Make at least half of your grain choices whole grains, such as brown rice, whole wheat bread, and oats. Whole grains provide fiber, which aids in digestion and healthy cholesterol levels. Aim for whole forms of starchy vegetables such as potatoes, sweet potatoes, beans, peas, and corn, which are fiber rich and provide many vitamins and minerals.  Protein: Incorporate lean sources of protein, such as poultry, fish, beans, nuts, and seeds, into your meals. Protein is essential for building and repairing tissues, staying full, balancing blood sugar, as well as supporting immune function. Dairy: Include low-fat or fat-free dairy products like milk, yogurt, and cheese in your diet. Dairy foods are excellent sources of calcium and vitamin D, which are crucial for bone health.   - RD discussed pertinent lab values and the role diet can play in helping ot improve blood lipids and A1C: Fiber with meals can help to slow the digestion and absorption of dietary sugars and fat: Dietary fiber is essential for health and comes in two types: soluble and insoluble fiber. Soluble Fiber: Characteristics: Dissolves in water, forming a gel-like substance. Sources: Oats, nuts, seeds, beans, lentils, fruits (apples, citrus), and vegetables (carrots). Benefits: Regulates blood sugar, lowers LDL cholesterol, supports heart health, and aids in digestion by forming a gel that prevents diarrhea. Insoluble Fiber: Characteristics: Does not dissolve in water and adds bulk to stool. Sources: Whole grains, bran, nuts, seeds, vegetables (cauliflower, green beans), and fruits (apples with skin, berries). Benefits: Promotes regular bowel movements, aids in weight management, and promotes gut health.   - RD discussed the importance of making sure we eat enough during the day, aim for 3 meals and 1-2 balanced snacks; usually wait 1-2 hours after a meal to have a  snack; it can be helpful to set reminders or alarms around meal times on busy days to make sure we don't accidentally skip a meal   Handouts Provided Include  Balanced Snacks  Learning Style & Readiness for Change Teaching method utilized: Visual & Auditory  Demonstrated degree of understanding via: Teach Back  Barriers to learning/adherence to lifestyle change: Stress at work and home  Goals Established by Pt 1)  Consider meal prepping: -  having breakfasts and lunches ready to go to make your day a little easier  - have some of that oatmeal for a delicious, fiber filled breakfast.   MONITORING & EVALUATION Dietary intake, weekly physical activity, and labs in 4 weeks.  Next Steps  Patient is to call for questions.

## 2022-12-23 NOTE — Patient Instructions (Signed)
[place]:  1)  Consider meal prepping: - having breakfasts and lunches ready to go to make your day a little easier  - have some of that oatmeal for a delicious, fiber filled breakfast.  TIP: try to touch all of your food groups everyday Fruits & Vegetables: Aim to fill half your plate with a variety of fruits and vegetables. They are rich in vitamins, minerals, and fiber, and can help reduce the risk of chronic diseases. Choose a colorful assortment of fruits and vegetables to ensure you get a wide range of nutrients. Grains and Starches: Make at least half of your grain choices whole grains, such as brown rice, whole wheat bread, and oats. Whole grains provide fiber, which aids in digestion and healthy cholesterol levels. Aim for whole forms of starchy vegetables such as potatoes, sweet potatoes, beans, peas, and corn, which are fiber rich and provide many vitamins and minerals.  Protein: Incorporate lean sources of protein, such as poultry, fish, beans, nuts, and seeds, into your meals. Protein is essential for building and repairing tissues, staying full, balancing blood sugar, as well as supporting immune function. Dairy: Include low-fat or fat-free dairy products like milk, yogurt, and cheese in your diet. Dairy foods are excellent sources of calcium and vitamin D, which are crucial for bone health.

## 2023-01-15 ENCOUNTER — Other Ambulatory Visit: Payer: Self-pay

## 2023-01-15 ENCOUNTER — Encounter: Payer: 59 | Attending: Nurse Practitioner | Admitting: Dietician

## 2023-01-15 ENCOUNTER — Encounter: Payer: Self-pay | Admitting: Dietician

## 2023-01-15 NOTE — Progress Notes (Signed)
Specialty Pharmacy Refill Coordination Note  [name] is a 57 y.o. female contacted today regarding refills of specialty medication(s) Upadacitinib   Patient requested Pickup at [facility] at [place] date: 01/25/23   Medication will be filled on 01/22/23.

## 2023-01-15 NOTE — Progress Notes (Signed)
Medical Nutrition Therapy : 01/15/23  Appointment Start time:  08:00  Appointment End time:  08:30  Referral diagnosis: E66.3 (ICD-10-CM) - Overweight  Referring Provider: Tollie Eth, NP  Preferred learning style: no preference indicated Learning readiness: ready   Primary Concerns:  [name] arrives today endorsing this is first visit with a dietitian. She stated that primary concerns today are for elevated cholesterol, A1C, and weight gain. Pt endorses that she has familial history of elevated cholesterol (and having higher levels is normal for her and that it was well controlled "if weight was maintained")- however, it is noted that taking Rinvoq for her UC may also cause increase in cholesterol- now taking Crestor to help with this.  Pt states that UC is in remission and she follows a normal diet, she only avoids drinking milk but will have yogurt/cottage cheese and cheese without noticeable discomfort.  Pt states that with current health concerns, she feels uncertain on what is considered "okay" to eat, and feels pressured from friends/colleagues to avoid/focus on certain foods and food groups (ex: avoiding carbohydrates); feels that she needs to give herself permission to eat the things that others say not to eat; wants to talk about eating plant sources of protein.  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 Typical work day 5 am - 8 pm; lunch usually 1-2 pm, may not always have breakfast, and late dinner. Recently trying to bring dinner with her to eat around 5 pm.  Says has a sweet tooth and tryting to stick with desert once a week- loves to bake. _________________________________________________________________________________ [name] arrives for scheduled follow-up today.    that she has been able to work on goals established last visit: she has been incorporating breakfast  more often (5+ days a week), and is having snacks when needs. She reports that she has had good energy levels and has noticed that she does not feel like she is "starving" during the day, but will have a "normal" amount of hunger.   She reports that she has been eating more consistently throughout the day by prepping meals in advance to bring to work. She says she is happy to no longer feel like she is cutting out foods that she loves.  She will have labs updated in the next year to re-assess A1C.  NUTRITION ASSESSMENT   Anthropometrics  Ht:  Wt: pt reported: 165lb  Clinical Medical Hx: Ulcerative Colitis, Hypercholesterolemia, Prediabetes Medications: See medications list; Rosuvastatin,   Labs:   Latest Reference Range & Units Most Recent  Cholesterol 0 - 200 mg/dL 161 (H) 0/96/04 54:09  Cholesterol, Total 100 - 199 mg/dL 811 11/27/76 29:56  HDL Cholesterol >39 mg/dL 67 04/28/06 65:78  LDL (calc) 0 - 99 mg/dL 469 (H) 09/12/50 84:13  Direct LDL mg/dL 244.0 01/10/24 36:64  LDL Cholesterol (Calc) mg/dL (calc) 403 (H) 06/20/40 59:56    Latest Reference Range & Units Most Recent  Hemoglobin A1C 4.8 - 5.6 % 5.7 (H) 11/30/22 10:29  (H): Data is abnormally high  Notable Signs/Symptoms: reports that she is going to the bathroom more regularly; no c/o UC symptoms at this visit Food Allergies: none Avoidances: milk  Lifestyle & Dietary Hx Physicall activity: Works out 3x a week with weights and adding cardio. Exercise class with group. Weekends are mostly yard works as she takes of two house holds.  Walks dogs a lot. Uses the gym at work for [employer] and walking, also walks around work building on nice days  Snacks: popcorn, apple peanut butter  Will do collagen with tea in the morning.  Estimated daily fluid intake: 64+ oz no changes Supplements: vitamin C and vitamin D Sleep: sleep is good: ~7 hrs; feels that it comes in waves  Stress / self-care: high stress at home, busy job, still  feels busy and would like to go to the gym more, there is a .  24-Hr Dietary Recall First Meal: apple with slices of cheese; greek yogurt, peanut butter and blackberries; Tea Snack: - Second Meal: chicken salad (greek yogurt, mayo, onion, spices); meal prepped salad, tomatoes and veggies, grilled veggie chicken patty Snack: halloween donut Third Meal: beef vegetable soup; meal prepped salad, tomatoes and veggies, grilled veggie chicken patty  Snack: chocolate granola bar OR piece of fruit; archer mini beef stick x 2 + popcorn Beverages: Diet pepsi, large bottle of water and can of sparkling water  Typical beverages: tea with collagen powder and sugar free creamer, water with true lemon (sugar free), diet pepsi 1 a day.   NUTRITION DIAGNOSIS  NB-1.1 Food and nutrition-related knowledge deficit As related to lack of prior food and nutrition education.  As evidenced by pt endorsed not having previously received food and nutrition counseling form and RD.   NUTRITION INTERVENTION  Nutrition education (E-1) on the following topics:   Protein: the amount of protein needed each day is different for every body; according to the [place] Dietary Guidelines for Americans, protein should make up between 10-35% of our daily energy intake. For adults ages 55-59 following an intake of 1500 to 2000 calories/day, it is recommended to consume about 5 to 5 1/2 oz of protein each day Protein Foods (1 ounce eq): 1 ounce lean meats, poultry, or seafood; 1 egg;  cup cooked beans or tofu; 1 tbsp nut or seed butter;  ounce nuts or seeds. For plant based protein, make sure to eat a variety of foods such as legumes, soy foods, seeds, nuts, grains and vegetables to make sure we can make the most of the sources of protein. Pairing foods like legumes and grains makes it easier for our bodies to put this protein to use- this can be done in one meal, or be spread throughout the day.   - RD educated the pt on the importance  and role of different food groups in the diet; RD encouraged pt to try to maintain a diet that is inclusive of all food groups Fruits & Vegetables: Aim to fill half your plate with a variety of fruits and vegetables. They are rich in vitamins, minerals, and fiber, and can help reduce the risk of chronic diseases. Choose a colorful assortment of fruits and vegetables to ensure you get a wide range of nutrients. Grains and Starches: Make at least half of your grain choices whole grains, such as brown rice, whole wheat bread, and oats. Whole grains provide fiber, which aids in digestion and healthy cholesterol levels. Aim for whole forms of starchy vegetables such as potatoes, sweet potatoes, beans, peas, and corn, which are fiber rich and provide many vitamins and minerals.  Protein: Incorporate lean sources of protein, such as poultry, fish, beans, nuts, and seeds, into your meals. Protein is essential for building and repairing tissues, staying full, balancing blood sugar, as well as supporting immune function. Dairy: Include low-fat or fat-free dairy products like milk, yogurt, and  cheese in your diet. Dairy foods are excellent sources of calcium and vitamin D, which are crucial for bone health.   - RD discussed pertinent lab values and the role diet can play in helping ot improve blood lipids and A1C: Fiber with meals can help to slow the digestion and absorption of dietary sugars and fat: Dietary fiber is essential for health and comes in two types: soluble and insoluble fiber. Soluble Fiber: Characteristics: Dissolves in water, forming a gel-like substance. Sources: Oats, nuts, seeds, beans, lentils, fruits (apples, citrus), and vegetables (carrots). Benefits: Regulates blood sugar, lowers LDL cholesterol, supports heart health, and aids in digestion by forming a gel that prevents diarrhea. Insoluble Fiber: Characteristics: Does not dissolve in water and adds bulk to stool. Sources: Whole grains, bran,  nuts, seeds, vegetables (cauliflower, green beans), and fruits (apples with skin, berries). Benefits: Promotes regular bowel movements, aids in weight management, and promotes gut health.   - RD discussed the importance of making sure we eat enough during the day, aim for 3 meals and 1-2 balanced snacks; usually wait 1-2 hours after a meal to have a snack; it can be helpful to set reminders or alarms around meal times on busy days to make sure we don't accidentally skip a meal   Handouts provided include None provided this visit  Previous Handouts Provided Include  Balanced Snacks  Learning Style & Readiness for Change Teaching method utilized: Visual & Auditory  Demonstrated degree of understanding via: Teach Back  Barriers to learning/adherence to lifestyle change: Stress at work and home  Goals Established by Pt: In progress 1)  Consider meal prepping: -  having breakfasts and lunches ready to go to make your day a little easier  - have some of that oatmeal for a delicious, fiber filled breakfast.   MONITORING & EVALUATION Dietary intake, weekly physical activity, and labs as available  Next Steps  Patient is to call for questions or to reschedule once labs have been reassessed.

## 2023-01-22 ENCOUNTER — Other Ambulatory Visit (HOSPITAL_COMMUNITY): Payer: Self-pay

## 2023-01-22 ENCOUNTER — Other Ambulatory Visit: Payer: Self-pay

## 2023-01-25 ENCOUNTER — Other Ambulatory Visit (HOSPITAL_COMMUNITY): Payer: Self-pay

## 2023-02-05 ENCOUNTER — Encounter: Payer: Self-pay | Admitting: Nurse Practitioner

## 2023-02-05 ENCOUNTER — Ambulatory Visit (INDEPENDENT_AMBULATORY_CARE_PROVIDER_SITE_OTHER): Payer: 59 | Admitting: Nurse Practitioner

## 2023-02-05 ENCOUNTER — Other Ambulatory Visit (HOSPITAL_COMMUNITY): Payer: Self-pay

## 2023-02-05 VITALS — BP 124/80 | HR 60 | Temp 98.6°F | Ht 65.0 in | Wt 175.0 lb

## 2023-02-05 DIAGNOSIS — N951 Menopausal and female climacteric states: Secondary | ICD-10-CM | POA: Diagnosis not present

## 2023-02-05 DIAGNOSIS — R300 Dysuria: Secondary | ICD-10-CM

## 2023-02-05 DIAGNOSIS — A498 Other bacterial infections of unspecified site: Secondary | ICD-10-CM | POA: Diagnosis not present

## 2023-02-05 DIAGNOSIS — D849 Immunodeficiency, unspecified: Secondary | ICD-10-CM | POA: Diagnosis not present

## 2023-02-05 DIAGNOSIS — R3989 Other symptoms and signs involving the genitourinary system: Secondary | ICD-10-CM

## 2023-02-05 LAB — POCT URINALYSIS DIP (CLINITEK)
Bilirubin, UA: NEGATIVE
Blood, UA: NEGATIVE
Glucose, UA: NEGATIVE mg/dL
Ketones, POC UA: NEGATIVE mg/dL
Leukocytes, UA: NEGATIVE
Nitrite, UA: NEGATIVE
POC PROTEIN,UA: NEGATIVE
Spec Grav, UA: 1.015 (ref 1.010–1.025)
Urobilinogen, UA: 0.2 U/dL
pH, UA: 6 (ref 5.0–8.0)

## 2023-02-05 MED ORDER — FLUCONAZOLE 150 MG PO TABS
ORAL_TABLET | ORAL | 2 refills | Status: DC
Start: 2023-02-05 — End: 2023-06-10
  Filled 2023-02-05: qty 2, 2d supply, fill #0

## 2023-02-05 MED ORDER — NITROFURANTOIN MONOHYD MACRO 100 MG PO CAPS
100.0000 mg | ORAL_CAPSULE | Freq: Two times a day (BID) | ORAL | 0 refills | Status: DC
  Filled 2023-02-05: qty 14, 7d supply, fill #0

## 2023-02-05 NOTE — Progress Notes (Unsigned)
  Tollie Eth, DNP, AGNP-c [facility] [address] [place], [place] [address] [phone]   ACUTE VISIT- ESTABLISHED PATIENT  There were no vitals taken for this visit.  Subjective:  HPI [name] is a 57 y.o. female presents to day for evaluation of acute concern(s).     ROS negative except for what is listed in HPI. History, Medications, Surgery, SDOH, and Family History reviewed and updated as appropriate.  Objective:  Physical Exam       Assessment & Plan:   Problem List Items Addressed This Visit   None     Tollie Eth, DNP, AGNP-c

## 2023-02-09 ENCOUNTER — Encounter: Payer: Self-pay | Admitting: Nurse Practitioner

## 2023-02-09 ENCOUNTER — Other Ambulatory Visit (HOSPITAL_COMMUNITY): Payer: Self-pay

## 2023-02-09 DIAGNOSIS — A498 Other bacterial infections of unspecified site: Secondary | ICD-10-CM

## 2023-02-09 LAB — URINE CULTURE

## 2023-02-09 MED ORDER — FOSFOMYCIN TROMETHAMINE 3 G PO PACK
3.0000 g | PACK | Freq: Once | ORAL | 0 refills | Status: AC
  Filled 2023-02-09: qty 3, 1d supply, fill #0

## 2023-02-10 ENCOUNTER — Other Ambulatory Visit (HOSPITAL_COMMUNITY): Payer: Self-pay

## 2023-02-17 ENCOUNTER — Other Ambulatory Visit: Payer: Self-pay

## 2023-02-17 ENCOUNTER — Ambulatory Visit
Admission: RE | Admit: 2023-02-17 | Discharge: 2023-02-17 | Disposition: A | Discharge status: Home / Self Care | Payer: 59 | Source: Ambulatory Visit | Attending: Obstetrics and Gynecology | Admitting: Obstetrics and Gynecology

## 2023-02-17 DIAGNOSIS — Z1231 Encounter for screening mammogram for malignant neoplasm of breast: Secondary | ICD-10-CM | POA: Diagnosis not present

## 2023-02-17 NOTE — Progress Notes (Signed)
Specialty Pharmacy Refill Coordination Note  [name] is a 57 y.o. female contacted today regarding refills of specialty medication(s) Upadacitinib   Patient requested Pickup at [facility] at [place] date: 02/22/23   Medication will be filled on 02/19/23.

## 2023-02-18 DIAGNOSIS — R102 Pelvic and perineal pain: Secondary | ICD-10-CM | POA: Diagnosis not present

## 2023-02-19 ENCOUNTER — Other Ambulatory Visit: Payer: Self-pay | Admitting: Obstetrics and Gynecology

## 2023-02-19 DIAGNOSIS — R928 Other abnormal and inconclusive findings on diagnostic imaging of breast: Secondary | ICD-10-CM

## 2023-02-22 DIAGNOSIS — R102 Pelvic and perineal pain: Secondary | ICD-10-CM | POA: Diagnosis not present

## 2023-02-22 DIAGNOSIS — Z780 Asymptomatic menopausal state: Secondary | ICD-10-CM | POA: Diagnosis not present

## 2023-02-22 DIAGNOSIS — R9389 Abnormal findings on diagnostic imaging of other specified body structures: Secondary | ICD-10-CM | POA: Diagnosis not present

## 2023-02-24 ENCOUNTER — Encounter: Payer: Self-pay | Admitting: Nurse Practitioner

## 2023-03-08 ENCOUNTER — Ambulatory Visit: Payer: 59

## 2023-03-08 ENCOUNTER — Ambulatory Visit
Admission: RE | Admit: 2023-03-08 | Discharge: 2023-03-08 | Disposition: A | Discharge status: Home / Self Care | Payer: 59 | Source: Ambulatory Visit | Attending: Obstetrics and Gynecology | Admitting: Obstetrics and Gynecology

## 2023-03-08 DIAGNOSIS — R928 Other abnormal and inconclusive findings on diagnostic imaging of breast: Secondary | ICD-10-CM

## 2023-03-16 ENCOUNTER — Other Ambulatory Visit: Payer: Self-pay

## 2023-03-18 ENCOUNTER — Other Ambulatory Visit (HOSPITAL_COMMUNITY): Payer: Self-pay

## 2023-03-18 NOTE — Progress Notes (Signed)
 Specialty Pharmacy Refill Coordination Note  [name] is a 58 y.o. female contacted today regarding refills of specialty medication(s) Upadacitinib  (Rinvoq )   Patient requested [name] at [facility] at [place] date: 03/24/23   Medication will be filled on 03/23/23.

## 2023-03-23 ENCOUNTER — Other Ambulatory Visit: Payer: Self-pay

## 2023-03-24 DIAGNOSIS — L710 Perioral dermatitis: Secondary | ICD-10-CM | POA: Diagnosis not present

## 2023-03-24 DIAGNOSIS — N943 Premenstrual tension syndrome: Secondary | ICD-10-CM | POA: Diagnosis not present

## 2023-03-24 DIAGNOSIS — K5190 Ulcerative colitis, unspecified, without complications: Secondary | ICD-10-CM | POA: Diagnosis not present

## 2023-03-24 DIAGNOSIS — L700 Acne vulgaris: Secondary | ICD-10-CM | POA: Diagnosis not present

## 2023-03-24 DIAGNOSIS — Z01419 Encounter for gynecological examination (general) (routine) without abnormal findings: Secondary | ICD-10-CM | POA: Diagnosis not present

## 2023-03-24 DIAGNOSIS — Z780 Asymptomatic menopausal state: Secondary | ICD-10-CM | POA: Diagnosis not present

## 2023-03-24 DIAGNOSIS — N882 Stricture and stenosis of cervix uteri: Secondary | ICD-10-CM | POA: Diagnosis not present

## 2023-03-24 DIAGNOSIS — R102 Pelvic and perineal pain: Secondary | ICD-10-CM | POA: Diagnosis not present

## 2023-03-25 ENCOUNTER — Other Ambulatory Visit (HOSPITAL_BASED_OUTPATIENT_CLINIC_OR_DEPARTMENT_OTHER): Payer: Self-pay

## 2023-03-25 ENCOUNTER — Other Ambulatory Visit (HOSPITAL_COMMUNITY): Payer: Self-pay

## 2023-03-25 ENCOUNTER — Encounter (HOSPITAL_COMMUNITY): Payer: Self-pay

## 2023-03-25 ENCOUNTER — Other Ambulatory Visit: Payer: Self-pay

## 2023-03-25 MED ORDER — IBUPROFEN 800 MG PO TABS
800.0000 mg | ORAL_TABLET | Freq: Four times a day (QID) | ORAL | 11 refills | Status: DC | PRN
  Filled 2023-03-25: qty 30, 8d supply, fill #0

## 2023-03-25 MED ORDER — METRONIDAZOLE 0.75 % EX CREA
TOPICAL_CREAM | Freq: Two times a day (BID) | CUTANEOUS | 2 refills | Status: DC
  Filled 2023-03-25: qty 45, 30d supply, fill #0
  Filled 2023-05-18: qty 45, 30d supply, fill #1
  Filled 2023-08-18: qty 45, 30d supply, fill #2

## 2023-03-25 MED ORDER — MISOPROSTOL 200 MCG PO TABS
200.0000 ug | ORAL_TABLET | ORAL | 0 refills | Status: DC
  Filled 2023-03-25: qty 1, 1d supply, fill #0

## 2023-03-26 ENCOUNTER — Other Ambulatory Visit (HOSPITAL_COMMUNITY): Payer: Self-pay

## 2023-03-31 DIAGNOSIS — R9389 Abnormal findings on diagnostic imaging of other specified body structures: Secondary | ICD-10-CM | POA: Diagnosis not present

## 2023-04-13 ENCOUNTER — Other Ambulatory Visit: Payer: Self-pay

## 2023-04-13 NOTE — Progress Notes (Signed)
Specialty Pharmacy Refill Coordination Note  [name] is a 58 y.o. female contacted today regarding refills of specialty medication(s) Upadacitinib (Rinvoq)   Patient requested [name] at [facility] at [place] date: 04/21/23   Medication will be filled on 04/20/23.

## 2023-04-19 ENCOUNTER — Other Ambulatory Visit (HOSPITAL_COMMUNITY): Payer: Self-pay

## 2023-05-13 ENCOUNTER — Other Ambulatory Visit: Payer: Self-pay

## 2023-05-13 ENCOUNTER — Other Ambulatory Visit (HOSPITAL_COMMUNITY): Payer: Self-pay

## 2023-05-13 NOTE — Progress Notes (Signed)
 Specialty Pharmacy Refill Coordination Note  [name] is a 58 y.o. female contacted today regarding refills of specialty medication(s) Upadacitinib (Rinvoq)   Patient requested [name] at [facility] at [place] date: 05/20/23   Medication will be filled on 05/19/23.

## 2023-05-13 NOTE — Progress Notes (Signed)
 Specialty Pharmacy Ongoing Clinical Assessment Note  [name] is a 58 y.o. female who is being followed by the specialty pharmacy service for RxSp Ulcerative Colitis   Patient's specialty medication(s) reviewed today: Upadacitinib (Rinvoq)   Missed doses in the last 4 weeks: 0   Patient/Caregiver did not have any additional questions or concerns.   Therapeutic benefit summary: Patient is achieving benefit   Adverse events/side effects summary: No adverse events/side effects   Patient's therapy is appropriate to: Continue    Goals Addressed             This Visit's Progress    Minimize recurrence of flares       Patient is on track. Patient will maintain adherence.  Patient reports that her UC is well-controlled at this time.          Follow up:  6 months  Servando Snare Specialty Pharmacist

## 2023-05-19 ENCOUNTER — Other Ambulatory Visit: Payer: Self-pay

## 2023-05-19 ENCOUNTER — Other Ambulatory Visit (HOSPITAL_COMMUNITY): Payer: Self-pay

## 2023-05-19 DIAGNOSIS — L718 Other rosacea: Secondary | ICD-10-CM | POA: Diagnosis not present

## 2023-05-19 DIAGNOSIS — L700 Acne vulgaris: Secondary | ICD-10-CM | POA: Diagnosis not present

## 2023-05-19 MED ORDER — AZELAIC ACID 15 % EX GEL
Freq: Every evening | CUTANEOUS | 4 refills | Status: DC
  Filled 2023-05-19: qty 50, 30d supply, fill #0
  Filled 2023-08-18: qty 50, 30d supply, fill #1
  Filled 2023-09-24: qty 50, 30d supply, fill #2
  Filled 2023-10-22: qty 50, 30d supply, fill #3
  Filled 2023-11-25: qty 50, 30d supply, fill #4

## 2023-06-07 ENCOUNTER — Encounter: Payer: Self-pay | Admitting: Nurse Practitioner

## 2023-06-08 ENCOUNTER — Other Ambulatory Visit: Payer: Self-pay | Admitting: Pharmacist

## 2023-06-08 ENCOUNTER — Other Ambulatory Visit: Payer: Self-pay

## 2023-06-08 ENCOUNTER — Other Ambulatory Visit: Payer: Self-pay | Admitting: Internal Medicine

## 2023-06-08 ENCOUNTER — Other Ambulatory Visit (HOSPITAL_COMMUNITY): Payer: Self-pay

## 2023-06-08 MED ORDER — RINVOQ 30 MG PO TB24
30.0000 mg | ORAL_TABLET | Freq: Every day | ORAL | 11 refills | Status: DC
  Filled ????-??-??: fill #0

## 2023-06-08 MED ORDER — RINVOQ 30 MG PO TB24
30.0000 mg | ORAL_TABLET | Freq: Every day | ORAL | 11 refills | Status: AC
  Filled 2023-06-08: qty 30, 30d supply, fill #0
  Filled 2023-07-20 (×2): qty 30, 30d supply, fill #1
  Filled 2023-08-19: qty 30, 30d supply, fill #2
  Filled 2023-09-14: qty 30, 30d supply, fill #3
  Filled 2023-10-22 (×2): qty 30, 30d supply, fill #4
  Filled 2023-11-19: qty 30, 30d supply, fill #5
  Filled 2023-12-20 (×2): qty 30, 30d supply, fill #6
  Filled 2024-01-13: qty 30, 30d supply, fill #7
  Filled 2024-02-16 (×2): qty 30, 30d supply, fill #8
  Filled 2024-03-17: qty 30, 30d supply, fill #9
  Filled 2024-04-14: qty 30, 30d supply, fill #10

## 2023-06-08 NOTE — Progress Notes (Signed)
 Specialty Pharmacy Refill Coordination Note  [name] is a 58 y.o. female contacted today regarding refills of specialty medication(s) Upadacitinib (Rinvoq)   Patient requested (Patient-Rptd) Pickup at [facility] at [facility] date: (Patient-Rptd) 06/30/23   Medication will be filled on 06/29/23. This fill date is pending response to refill request from provider. Patient is aware and if they have not received fill by intended date they must follow up with pharmacy.

## 2023-06-10 ENCOUNTER — Encounter: Payer: Self-pay | Admitting: Nurse Practitioner

## 2023-06-10 ENCOUNTER — Ambulatory Visit: Payer: 59 | Admitting: Nurse Practitioner

## 2023-06-10 ENCOUNTER — Other Ambulatory Visit (HOSPITAL_COMMUNITY): Payer: Self-pay

## 2023-06-10 VITALS — BP 122/80 | HR 64 | Wt 160.4 lb

## 2023-06-10 DIAGNOSIS — J0140 Acute pansinusitis, unspecified: Secondary | ICD-10-CM

## 2023-06-10 DIAGNOSIS — D849 Immunodeficiency, unspecified: Secondary | ICD-10-CM

## 2023-06-10 DIAGNOSIS — E782 Mixed hyperlipidemia: Secondary | ICD-10-CM | POA: Diagnosis not present

## 2023-06-10 DIAGNOSIS — R7303 Prediabetes: Secondary | ICD-10-CM | POA: Diagnosis not present

## 2023-06-10 LAB — LIPID PANEL

## 2023-06-10 MED ORDER — FLUCONAZOLE 150 MG PO TABS
150.0000 mg | ORAL_TABLET | Freq: Once | ORAL | 2 refills | Status: AC
  Filled 2023-06-10: qty 2, 2d supply, fill #0

## 2023-06-10 MED ORDER — AMOXICILLIN-POT CLAVULANATE 875-125 MG PO TABS
1.0000 | ORAL_TABLET | Freq: Two times a day (BID) | ORAL | 0 refills | Status: DC
  Filled 2023-06-10: qty 10, 5d supply, fill #0

## 2023-06-10 NOTE — Assessment & Plan Note (Signed)
 Elevated A1c with recent weight loss potentially improving glycemic control. Fasting for lab work today to recheck A1c levels. Continued weight management is encouraged to aid in glycemic control. - Order A1c test - Encourage continued weight management

## 2023-06-10 NOTE — Assessment & Plan Note (Signed)
 Suspected sinus infection with symptoms persisting for almost two weeks, including headache and facial pain. Examination revealed fluid in the ears and swelling at the ear base, indicating sinus involvement. No chest pain or shortness of breath reported. - Prescribe Augmentin - Prescribe Diflucan as needed for potential yeast infection - Advise taking Allegra, Claritin, or Zyrtec regularly through May - Consider Xyzal at bedtime if needed for more aggressive allergy management

## 2023-06-10 NOTE — Progress Notes (Signed)
 Shawna Clamp, DNP, AGNP-c [facility]  [address] [place], [place] [address] [phone]  ESTABLISHED PATIENT- Chronic Health and/or Follow-Up Visit  Blood pressure 122/80, pulse 64, weight 160 lb 6.4 oz (72.8 kg).    [name] is a 58 y.o. year old female presenting today for evaluation and management of chronic conditions.   History of Present Illness [name] is a 58 year old female who presents with symptoms suggestive of a sinus infection.  She has been experiencing symptoms consistent with a sinus infection for almost two weeks, including a persistent headache and facial pain. She describes feeling fullness in her ears and tenderness at the ear base, which is swollen. No ear pain is reported.  She uses Flonase and occasionally takes Allegra, Claritin, or Zyrtec as needed for allergies. No chest pain, shortness of breath, increased urination, hunger, or thirst.  She is currently on Crestor for cholesterol management. A previous cardiac CT scan showed a score of 40, with plaque located in the LAD. She has lost some weight and hopes this will improve her A1c levels, which were previously elevated.  She has a history of recurrent urinary tract infections (UTIs) and recalls a previous treatment with a single-dose powder that was effective. No current symptoms of a UTI.  She underwent a uterine biopsy in December due to a thickened uterus, which was negative for cancer. No symptoms such as spotting or bleeding. Her mammogram in December showed a spot that required biopsy, but this was normal, as well.  All ROS negative with exception of what is listed above.   PHYSICAL EXAM Physical Exam Vitals and nursing note reviewed.  Constitutional:      General: She is not in acute distress. HENT:     Head: Normocephalic.     Comments: Mild swelling noted bilaterally in the maxillary sinus area. Tenderness in maxillary and frontal sinuses.     Right Ear: A  middle ear effusion is present.     Left Ear: A middle ear effusion is present.     Mouth/Throat:     Mouth: Mucous membranes are moist.     Pharynx: Uvula midline. Posterior oropharyngeal erythema present.  Neck:     Vascular: No carotid bruit.  Cardiovascular:     Rate and Rhythm: Normal rate and regular rhythm.     Pulses: Normal pulses.     Heart sounds: Normal heart sounds.  Pulmonary:     Effort: Pulmonary effort is normal.     Breath sounds: Normal breath sounds.  Musculoskeletal:     Cervical back: No tenderness.     Right lower leg: No edema.     Left lower leg: No edema.  Lymphadenopathy:     Cervical: Cervical adenopathy present.  Skin:    General: Skin is warm and dry.     Capillary Refill: Capillary refill takes less than 2 seconds.  Neurological:     Mental Status: She is alert and oriented to person, place, and time.  Psychiatric:        Mood and Affect: Mood normal.        Behavior: Behavior normal.      PLAN Problem List Items Addressed This Visit     Mixed hyperlipidemia   Elevated cholesterol levels managed with Crestor. Recent cardiac CT showed a score of 40, with plaque primarily in the LAD. Crestor is expected to help reduce plaque, as it is known to lower cholesterol and potentially reduce arterial plaque. Continued  monitoring of lipid levels is necessary to assess the effectiveness of the therapy. - Continue Crestor therapy - Order lipid panel      Relevant Orders   Hemoglobin A1c   CBC with Differential/Platelet   Comprehensive metabolic panel with GFR   Lipid panel   Immunocompromised (HCC)   Chronic in the setting of treatment for Crohn's diease. Recent uti required fosfamycin treatment for recurrent UTI's, this was successful with no return symptoms. Monitor infections carefully and treat aggressively if not successful with routine treatment options.       Acute non-recurrent pansinusitis   Suspected sinus infection with symptoms  persisting for almost two weeks, including headache and facial pain. Examination revealed fluid in the ears and swelling at the ear base, indicating sinus involvement. No chest pain or shortness of breath reported. - Prescribe Augmentin - Prescribe Diflucan as needed for potential yeast infection - Advise taking Allegra, Claritin, or Zyrtec regularly through May - Consider Xyzal at bedtime if needed for more aggressive allergy management      Relevant Medications   amoxicillin-clavulanate (AUGMENTIN) 875-125 MG tablet   fluconazole (DIFLUCAN) 150 MG tablet   Pre-diabetes - Primary   Elevated A1c with recent weight loss potentially improving glycemic control. Fasting for lab work today to recheck A1c levels. Continued weight management is encouraged to aid in glycemic control. - Order A1c test - Encourage continued weight management      Relevant Orders   Hemoglobin A1c   CBC with Differential/Platelet   Comprehensive metabolic panel with GFR   Lipid panel    Return in about 6 months (around 12/11/2023) for CPE.  Shawna Clamp, DNP, AGNP-c

## 2023-06-10 NOTE — Assessment & Plan Note (Signed)
 Chronic in the setting of treatment for Crohn's diease. Recent uti required fosfamycin treatment for recurrent UTI's, this was successful with no return symptoms. Monitor infections carefully and treat aggressively if not successful with routine treatment options.

## 2023-06-10 NOTE — Assessment & Plan Note (Signed)
 Elevated cholesterol levels managed with Crestor. Recent cardiac CT showed a score of 40, with plaque primarily in the LAD. Crestor is expected to help reduce plaque, as it is known to lower cholesterol and potentially reduce arterial plaque. Continued monitoring of lipid levels is necessary to assess the effectiveness of the therapy. - Continue Crestor therapy - Order lipid panel

## 2023-06-10 NOTE — Patient Instructions (Signed)
 HAPPY BIRTHDAY!!!!  If you have any concerns or aren't feeling better in a week please let me know.

## 2023-06-11 LAB — CBC WITH DIFFERENTIAL/PLATELET
Basophils Absolute: 0 10*3/uL (ref 0.0–0.2)
Basos: 1 %
EOS (ABSOLUTE): 0 10*3/uL (ref 0.0–0.4)
Eos: 1 %
Hematocrit: 38.6 % (ref 34.0–46.6)
Hemoglobin: 12.9 g/dL (ref 11.1–15.9)
Immature Grans (Abs): 0 10*3/uL (ref 0.0–0.1)
Immature Granulocytes: 1 %
Lymphocytes Absolute: 1 10*3/uL (ref 0.7–3.1)
Lymphs: 27 %
MCH: 30.6 pg (ref 26.6–33.0)
MCHC: 33.4 g/dL (ref 31.5–35.7)
MCV: 92 fL (ref 79–97)
Monocytes Absolute: 0.4 10*3/uL (ref 0.1–0.9)
Monocytes: 10 %
Neutrophils Absolute: 2.3 10*3/uL (ref 1.4–7.0)
Neutrophils: 60 %
Platelets: 351 10*3/uL (ref 150–450)
RBC: 4.22 x10E6/uL (ref 3.77–5.28)
RDW: 13.1 % (ref 11.7–15.4)
WBC: 3.8 10*3/uL (ref 3.4–10.8)

## 2023-06-11 LAB — COMPREHENSIVE METABOLIC PANEL WITH GFR
ALT: 18 IU/L (ref 0–32)
AST: 25 IU/L (ref 0–40)
Albumin: 5.1 g/dL — ABNORMAL HIGH (ref 3.8–4.9)
Alkaline Phosphatase: 64 IU/L (ref 44–121)
BUN/Creatinine Ratio: 22 (ref 9–23)
BUN: 18 mg/dL (ref 6–24)
Bilirubin Total: 0.6 mg/dL (ref 0.0–1.2)
CO2: 22 mmol/L (ref 20–29)
Calcium: 10.1 mg/dL (ref 8.7–10.2)
Chloride: 101 mmol/L (ref 96–106)
Creatinine, Ser: 0.83 mg/dL (ref 0.57–1.00)
Globulin, Total: 2.5 g/dL (ref 1.5–4.5)
Glucose: 77 mg/dL (ref 70–99)
Potassium: 4.6 mmol/L (ref 3.5–5.2)
Sodium: 139 mmol/L (ref 134–144)
Total Protein: 7.6 g/dL (ref 6.0–8.5)
eGFR: 83 mL/min/{1.73_m2} (ref >59)

## 2023-06-11 LAB — LIPID PANEL
Cholesterol, Total: 186 mg/dL (ref 100–199)
HDL: 71 mg/dL (ref >39)
LDL CALC COMMENT:: 2.6 ratio (ref 0.0–4.4)
LDL Chol Calc (NIH): 104 mg/dL — ABNORMAL HIGH (ref 0–99)
Triglycerides: 60 mg/dL (ref 0–149)
VLDL Cholesterol Cal: 11 mg/dL (ref 5–40)

## 2023-06-11 LAB — HEMOGLOBIN A1C
Est. average glucose Bld gHb Est-mCnc: 105 mg/dL
Hgb A1c MFr Bld: 5.3 % (ref 4.8–5.6)

## 2023-06-17 ENCOUNTER — Telehealth: Payer: Self-pay

## 2023-06-17 ENCOUNTER — Other Ambulatory Visit (HOSPITAL_COMMUNITY): Payer: Self-pay

## 2023-06-17 NOTE — Telephone Encounter (Signed)
 Pharmacy Patient Advocate Encounter   Received notification from CoverMyMeds that prior authorization for Rinvoq 30MG  er tablets is required/requested.   Insurance verification completed.   The patient is insured through [facility] .   Per test claim: The current 30 day co-pay is, $250.00.  No PA needed at this time. This test claim was processed through [facility]- copay amounts may vary at other pharmacies due to pharmacy/plan contracts, or as the patient moves through the different stages of their insurance plan.

## 2023-06-23 ENCOUNTER — Encounter: Payer: Self-pay | Admitting: Nurse Practitioner

## 2023-06-29 ENCOUNTER — Other Ambulatory Visit: Payer: Self-pay

## 2023-07-07 ENCOUNTER — Other Ambulatory Visit (HOSPITAL_COMMUNITY): Payer: Self-pay

## 2023-07-07 ENCOUNTER — Other Ambulatory Visit: Payer: Self-pay | Admitting: Nurse Practitioner

## 2023-07-07 DIAGNOSIS — E538 Deficiency of other specified B group vitamins: Secondary | ICD-10-CM

## 2023-07-07 MED ORDER — CYANOCOBALAMIN 1000 MCG/ML IJ SOLN
1000.0000 ug | INTRAMUSCULAR | 3 refills | Status: AC
  Filled 2023-07-07: qty 3, 90d supply, fill #0
  Filled 2023-10-22: qty 3, 90d supply, fill #1
  Filled 2024-01-13: qty 3, 90d supply, fill #2
  Filled 2024-03-03 – 2024-04-16 (×5): qty 3, 90d supply, fill #3

## 2023-07-12 ENCOUNTER — Other Ambulatory Visit (HOSPITAL_COMMUNITY): Payer: Self-pay

## 2023-07-20 ENCOUNTER — Other Ambulatory Visit (HOSPITAL_COMMUNITY): Payer: Self-pay

## 2023-07-20 ENCOUNTER — Other Ambulatory Visit: Payer: Self-pay

## 2023-07-20 ENCOUNTER — Other Ambulatory Visit: Payer: Self-pay | Admitting: Nurse Practitioner

## 2023-07-20 DIAGNOSIS — E782 Mixed hyperlipidemia: Secondary | ICD-10-CM

## 2023-07-20 MED ORDER — ROSUVASTATIN CALCIUM 5 MG PO TABS
5.0000 mg | ORAL_TABLET | Freq: Every day | ORAL | 3 refills | Status: AC
  Filled 2023-07-20: qty 90, 90d supply, fill #0
  Filled 2023-10-22: qty 90, 90d supply, fill #1
  Filled 2024-03-03: qty 90, 90d supply, fill #2

## 2023-07-20 NOTE — Progress Notes (Signed)
 Specialty Pharmacy Refill Coordination Note  [name] is a 58 y.o. female contacted today regarding refills of specialty medication(s) Rinvoq .  Patient requested (Patient-Rptd) Pickup at [facility] at [facility] date: (Patient-Rptd) 07/22/23   Medication will be filled on 07/21/23.

## 2023-07-21 ENCOUNTER — Other Ambulatory Visit: Payer: Self-pay

## 2023-07-21 ENCOUNTER — Telehealth: Payer: Self-pay

## 2023-07-21 ENCOUNTER — Other Ambulatory Visit (HOSPITAL_COMMUNITY): Payer: Self-pay

## 2023-07-21 NOTE — Telephone Encounter (Signed)
 Pharmacy Patient Advocate Encounter   Received notification from Pt Calls Messages that prior authorization for Rinvoq  30MG  er tablets is required/requested.   Insurance verification completed.   The patient is insured through [facility] .   Per test claim: PA required; PA submitted to above mentioned insurance via CoverMyMeds Key/confirmation #/EOC [id number] Status is pending

## 2023-07-21 NOTE — Progress Notes (Signed)
 Needs PA. Routed to [place].

## 2023-07-22 ENCOUNTER — Other Ambulatory Visit: Payer: Self-pay

## 2023-07-22 ENCOUNTER — Other Ambulatory Visit (HOSPITAL_COMMUNITY): Payer: Self-pay

## 2023-07-23 ENCOUNTER — Other Ambulatory Visit: Payer: Self-pay

## 2023-07-26 ENCOUNTER — Other Ambulatory Visit: Payer: Self-pay

## 2023-07-26 ENCOUNTER — Other Ambulatory Visit (HOSPITAL_COMMUNITY): Payer: Self-pay

## 2023-07-26 NOTE — Telephone Encounter (Signed)
 Pharmacy Patient Advocate Encounter  Received notification from [facility] that Prior Authorization for Rinvoq  30MG  er tablets has been APPROVED from 07-23-2023 to 07-21-2024   PA #/Case ID/Reference #: [id number]

## 2023-07-26 NOTE — Progress Notes (Signed)
 PA was approved.

## 2023-07-26 NOTE — Progress Notes (Signed)
 PA has been approved. Left patient a voicemail that medication is ready for pick up at [facility].

## 2023-08-19 ENCOUNTER — Other Ambulatory Visit: Payer: Self-pay

## 2023-08-19 NOTE — Progress Notes (Signed)
 Specialty Pharmacy Refill Coordination Note  [name] is a 58 y.o. female contacted today regarding refills of specialty medication(s) Upadacitinib  (Rinvoq )   Patient requested (Patient-Rptd) Pickup at [facility] at [facility] date: (Patient-Rptd) 08/23/23   Medication will be filled on 08/20/23.

## 2023-08-20 ENCOUNTER — Other Ambulatory Visit: Payer: Self-pay

## 2023-09-14 ENCOUNTER — Other Ambulatory Visit: Payer: Self-pay

## 2023-09-14 ENCOUNTER — Encounter (INDEPENDENT_AMBULATORY_CARE_PROVIDER_SITE_OTHER): Payer: Self-pay

## 2023-09-14 ENCOUNTER — Other Ambulatory Visit (HOSPITAL_COMMUNITY): Payer: Self-pay

## 2023-09-14 NOTE — Progress Notes (Signed)
 Specialty Pharmacy Refill Coordination Note  [name] is a 58 y.o. female contacted today regarding refills of specialty medication(s) Upadacitinib  (Rinvoq )   Patient requested (Patient-Rptd) Pickup at [facility] at [facility] date: (Patient-Rptd) 09/25/23   Medication will be filled on 09/24/23.

## 2023-09-24 ENCOUNTER — Other Ambulatory Visit: Payer: Self-pay

## 2023-09-27 ENCOUNTER — Other Ambulatory Visit (HOSPITAL_COMMUNITY): Payer: Self-pay

## 2023-09-27 DIAGNOSIS — H5203 Hypermetropia, bilateral: Secondary | ICD-10-CM | POA: Diagnosis not present

## 2023-10-13 ENCOUNTER — Ambulatory Visit: Admitting: Nurse Practitioner

## 2023-10-14 ENCOUNTER — Other Ambulatory Visit: Payer: Self-pay

## 2023-10-14 ENCOUNTER — Ambulatory Visit
Admission: RE | Admit: 2023-10-14 | Discharge: 2023-10-14 | Disposition: A | Discharge status: Home / Self Care | Source: Ambulatory Visit | Attending: Family Medicine | Admitting: Family Medicine

## 2023-10-14 VITALS — BP 123/80 | HR 69 | Temp 101.8°F | Resp 16

## 2023-10-14 DIAGNOSIS — B349 Viral infection, unspecified: Secondary | ICD-10-CM | POA: Diagnosis not present

## 2023-10-14 DIAGNOSIS — R509 Fever, unspecified: Secondary | ICD-10-CM | POA: Insufficient documentation

## 2023-10-14 DIAGNOSIS — J029 Acute pharyngitis, unspecified: Secondary | ICD-10-CM | POA: Diagnosis not present

## 2023-10-14 LAB — POC COVID19/FLU A&B COMBO
Covid Antigen, POC: NEGATIVE
Influenza A Antigen, POC: NEGATIVE
Influenza B Antigen, POC: NEGATIVE

## 2023-10-14 LAB — POCT RAPID STREP A (OFFICE): Rapid Strep A Screen: NEGATIVE

## 2023-10-14 MED ORDER — ACETAMINOPHEN 325 MG PO TABS
650.0000 mg | ORAL_TABLET | Freq: Once | ORAL | Status: AC
  Administered 2023-10-14: 650 mg via ORAL

## 2023-10-14 NOTE — ED Triage Notes (Signed)
 Pt c/o bodyaches, chills, productive cough w/yellow/green mucousx2d. PT states woke up this morning w/sore throat and dysphagia.

## 2023-10-14 NOTE — Discharge Instructions (Signed)
 You have tested negative for COVID, flu, strep throat please treat your symptoms with over the counter cough medication, tylenol  or ibuprofen , humidifier, and rest. Viral illnesses can last 7-14 days. Please follow up with your PCP if your symptoms are not improving. Please go to the ER for any worsening symptoms. This includes but is not limited to fever you can not control with tylenol  or ibuprofen , you are not able to stay hydrated, you have shortness of breath or chest pain.  Thank you for choosing [facility] for your healthcare needs. I hope you feel better soon!

## 2023-10-14 NOTE — ED Provider Notes (Signed)
 [facility]    CSN: [id number] Arrival date & time: 10/14/23  1038      History   Chief Complaint Chief Complaint  Patient presents with   Sore Throat    Cough, chills for 3 days - Entered by patient    HPI [name] is a 58 y.o. female  presents for evaluation of URI symptoms for 2-3 days. Patient reports associated symptoms of cough, congestion, sore throat, body aches, chills, subjective fevers. Denies N/V/D, ear pain, shortness of breath. Patient does not have a hx of asthma. Patient is not an active smoker.   Reports no known sick contacts.  Pt has taken DayQuil OTC for symptoms. Pt has no other concerns at this time.    Sore Throat    Past Medical History:  Diagnosis Date   Acute hemorrhagic cystitis 11/26/2022   DDD (degenerative disc disease), lumbar    S-I joints   Heart murmur    Hyperlipidemia    Superficial granulomatous pyoderma    Ulcerative colitis     Patient Active Problem List   Diagnosis Date Noted   Bladder pain 12/09/2022   Pre-diabetes 12/09/2022   Menopause syndrome 11/30/2022   Vitamin D  deficiency 11/30/2022   Coronary atherosclerosis due to calcified coronary lesion of native artery 07/29/2022   B12 deficiency 06/16/2022   Acute non-recurrent pansinusitis 04/24/2021   Immunocompromised (HCC) 07/01/2020   Encounter for annual physical exam 07/01/2020   Mixed hyperlipidemia 08/04/2019   BMI 28.0-28.9,adult 09/02/2017   Pyoderma gangrenosum 12/09/2016   LEFT SIDED ULCERATIVE COLITIS 08/15/2008    Past Surgical History:  Procedure Laterality Date   ABDOMINAL HERNIA REPAIR     age 58   COLONOSCOPY  05/12/2012   multiple    POLYPECTOMY     TONSILLECTOMY      OB History   No obstetric history on file.      Home Medications    Prior to Admission medications   Medication Sig Start Date End Date Taking? Authorizing Provider  amoxicillin -clavulanate (AUGMENTIN ) 875-125 MG tablet Take 1 tablet by mouth every 12  (twelve) hours. Take with food to avoid stomach upset. 06/10/23   Early, Sara E, NP  Azelaic Acid  15 % gel Apply topically to entire face every evening. 05/19/23     cholecalciferol (VITAMIN D3) 25 MCG (1000 UNIT) tablet Take 1,000 Units by mouth daily. Takes 2    [provider]  cyanocobalamin  (VITAMIN B12) 1000 MCG/ML injection Inject 1 mL (1,000 mcg total) into the muscle every 30 (thirty) days. Please include syringes and IM needles for injections. 07/07/23   Early, Sara E, NP  metroNIDAZOLE  (METROCREAM ) 0.75 % cream Apply topically 2 (two) times daily to face 03/24/23     rosuvastatin  (CRESTOR ) 5 MG tablet Take 1 tablet (5 mg total) by mouth daily. 07/20/23   Early, Sara E, NP  sertraline  (ZOLOFT ) 50 MG tablet Take 1 tablet (50 mg total) by mouth daily. 12/21/22   Early, Sara E, NP  Upadacitinib  ER (RINVOQ ) 30 MG TB24 Take 1 tablet  (30 mg) by mouth daily. 06/08/23   Jegede, Olugbemiga E, MD    Family History Family History  Problem Relation Age of Onset   Macular degeneration Father    Lung cancer Father 34   Breast cancer Mother    Hyperlipidemia Mother    Glaucoma Mother    Colon cancer Neg Hx    Colon polyps Neg Hx    Esophageal cancer Neg Hx  Rectal cancer Neg Hx    Stomach cancer Neg Hx     Social History Social History   Tobacco Use   Smoking status: Never    Passive exposure: Never   Smokeless tobacco: Never  Vaping Use   Vaping status: Never Used  Substance Use Topics   Alcohol use: Yes    Comment: rare   Drug use: No     Allergies   Doxycycline    Review of Systems Review of Systems  Constitutional:  Positive for fever.  HENT:  Positive for congestion and sore throat.   Respiratory:  Positive for cough.   Musculoskeletal:  Positive for myalgias.     Physical Exam Triage Vital Signs ED Triage Vitals  Encounter Vitals Group     BP 10/14/23 1130 123/80     Girls Systolic BP Percentile --      Girls Diastolic BP Percentile --      Boys Systolic  BP Percentile --      Boys Diastolic BP Percentile --      Pulse Rate 10/14/23 1130 69     Resp 10/14/23 1130 16     Temp 10/14/23 1130 (!) 101.8 F (38.8 C)     Temp Source 10/14/23 1130 Oral     SpO2 10/14/23 1130 98 %     Weight --      Height --      Head Circumference --      Peak Flow --      Pain Score 10/14/23 1128 5     Pain Loc --      Pain Education --      Exclude from Growth Chart --    No data found.  Updated Vital Signs BP 123/80   Pulse 69   Temp (!) 101.8 F (38.8 C) (Oral)   Resp 16   SpO2 98%   Visual Acuity Right Eye Distance:   Left Eye Distance:   Bilateral Distance:    Right Eye Near:   Left Eye Near:    Bilateral Near:     Physical Exam   UC Treatments / Results  Labs (all labs ordered are listed, but only abnormal results are displayed) Labs Reviewed  CULTURE, GROUP A STREP [facility])  POCT RAPID STREP A (OFFICE)  POC COVID19/FLU A&B COMBO    EKG   Radiology No results found.  Procedures Procedures (including critical care time)  Medications Ordered in UC Medications  acetaminophen  (TYLENOL ) tablet 650 mg (650 mg Oral Given 10/14/23 1135)    Initial Impression / Assessment and Plan / UC Course  I have reviewed the triage vital signs and the nursing notes.  Pertinent labs & imaging results that were available during my care of the patient were reviewed by me and considered in my medical decision making (see chart for details).  Clinical Course as of 10/14/23 1244  Thu Oct 14, 2023  1218 Temp recheck after Tylenol  101.1 oral [JM]    Clinical Course User Index [JM] Loreda Myla SAUNDERS, NP    Reviewed exam and symptoms with patient.  No red flags.  Negative rapid strep, flu, COVID testing.  Discussed viral illness and symptomatic treatment.  Advise rest fluids and PCP follow-up if symptoms do not improve.  ER precautions reviewed. Final Clinical Impressions(s) / UC Diagnoses   Final diagnoses:  Fever, unspecified  Viral illness   Sore throat     Discharge Instructions      You have tested negative for COVID, flu,  strep throat please treat your symptoms with over the counter cough medication, tylenol  or ibuprofen , humidifier, and rest. Viral illnesses can last 7-14 days. Please follow up with your PCP if your symptoms are not improving. Please go to the ER for any worsening symptoms. This includes but is not limited to fever you can not control with tylenol  or ibuprofen , you are not able to stay hydrated, you have shortness of breath or chest pain.  Thank you for choosing [facility] for your healthcare needs. I hope you feel better soon!      ED Prescriptions   None    PDMP not reviewed this encounter.   Loreda Myla SAUNDERS, NP 10/14/23 [phone]

## 2023-10-16 ENCOUNTER — Other Ambulatory Visit (HOSPITAL_COMMUNITY): Payer: Self-pay

## 2023-10-16 ENCOUNTER — Encounter

## 2023-10-16 ENCOUNTER — Telehealth: Admitting: Nurse Practitioner

## 2023-10-16 DIAGNOSIS — J069 Acute upper respiratory infection, unspecified: Secondary | ICD-10-CM | POA: Diagnosis not present

## 2023-10-16 MED ORDER — AZITHROMYCIN 250 MG PO TABS
ORAL_TABLET | ORAL | 0 refills | Status: AC
Start: 2023-10-16 — End: 2023-10-21
  Filled 2023-10-16: qty 6, 5d supply, fill #0

## 2023-10-16 NOTE — Progress Notes (Signed)
E-Visit for Cough   We are sorry that you are not feeling well.  Here is how we plan to help!   Based on your presentation I believe you most likely have A cough due to bacteria.  When patients have a fever and a productive cough with a change in color or increased sputum production, we are concerned about bacterial bronchitis.  If left untreated it can progress to pneumonia.  If your symptoms do not improve with your treatment plan it is important that you contact your provider.   I have prescribed Azithromyin 250 mg: two tablets now and then one tablet daily for 4 additonal days      From your responses in the eVisit questionnaire you describe inflammation in the upper respiratory tract which is causing a significant cough.  This is commonly called Bronchitis and has four common causes:   Allergies Viral Infections Acid Reflux Bacterial Infection Allergies, viruses and acid reflux are treated by controlling symptoms or eliminating the cause. An example might be a cough caused by taking certain blood pressure medications. You stop the cough by changing the medication. Another example might be a cough caused by acid reflux. Controlling the reflux helps control the cough.   USE OF BRONCHODILATOR ("RESCUE") INHALERS: There is a risk from using your bronchodilator too frequently.  The risk is that over-reliance on a medication which only relaxes the muscles surrounding the breathing tubes can reduce the effectiveness of medications prescribed to reduce swelling and congestion of the tubes themselves.  Although you feel brief relief from the bronchodilator inhaler, your asthma may actually be worsening with the tubes becoming more swollen and filled with mucus.  This can delay other crucial treatments, such as oral steroid medications. If you need to use a bronchodilator inhaler daily, several times per day, you should discuss this with your provider.  There are probably better treatments that could be  used to keep your asthma under control.                HOME CARE Only take medications as instructed by your medical team. Complete the entire course of an antibiotic. Drink plenty of fluids and get plenty of rest. Avoid close contacts especially the very young and the elderly Cover your mouth if you cough or cough into your sleeve. Always remember to wash your hands A steam or ultrasonic humidifier can help congestion.    GET HELP RIGHT AWAY IF: You develop worsening fever. You become short of breath You cough up blood. Your symptoms persist after you have completed your treatment plan MAKE SURE YOU  Understand these instructions. Will watch your condition. Will get help right away if you are not doing well or get worse.     Thank you for choosing an e-visit.   Your e-visit answers were reviewed by a board certified advanced clinical practitioner to complete your personal care plan. Depending upon the condition, your plan could have included both over the counter or prescription medications.   Please review your pharmacy choice. Make sure the pharmacy is open so you can pick up prescription now. If there is a problem, you may contact your provider through [employer] and have the prescription routed to another pharmacy.  Your safety is important to [place]. If you have drug allergies check your prescription carefully.    For the next 24 hours you can use MyChart to ask questions about today's visit, request a non-urgent call back, or ask for a work  or school excuse. You will get an email in the next two days asking about your experience. I hope that your e-visit has been valuable and will speed your recovery.

## 2023-10-16 NOTE — Progress Notes (Signed)
 I have spent 5 minutes in review of e-visit questionnaire, review and updating patient chart, medical decision making and response to patient.   Claiborne Rigg, NP

## 2023-10-17 LAB — CULTURE, GROUP A STREP (THRC)

## 2023-10-18 ENCOUNTER — Ambulatory Visit (HOSPITAL_COMMUNITY): Payer: Self-pay

## 2023-10-22 ENCOUNTER — Other Ambulatory Visit (HOSPITAL_BASED_OUTPATIENT_CLINIC_OR_DEPARTMENT_OTHER): Payer: Self-pay | Admitting: Nurse Practitioner

## 2023-10-22 ENCOUNTER — Other Ambulatory Visit: Payer: Self-pay

## 2023-10-22 ENCOUNTER — Other Ambulatory Visit (HOSPITAL_COMMUNITY): Payer: Self-pay

## 2023-10-22 MED ORDER — SERTRALINE HCL 50 MG PO TABS
50.0000 mg | ORAL_TABLET | Freq: Every day | ORAL | 1 refills | Status: DC
  Filled 2023-10-22: qty 90, 90d supply, fill #0

## 2023-10-22 NOTE — Progress Notes (Signed)
 Specialty Pharmacy Refill Coordination Note  [name] is a 58 y.o. female contacted today regarding refills of specialty medication(s) Upadacitinib  (Rinvoq )   Patient requested [name] at [facility] at [place] date: 10/26/23   Medication will be filled on 08.11.25.

## 2023-10-25 ENCOUNTER — Other Ambulatory Visit: Payer: Self-pay

## 2023-11-18 ENCOUNTER — Encounter (INDEPENDENT_AMBULATORY_CARE_PROVIDER_SITE_OTHER): Payer: Self-pay

## 2023-11-19 ENCOUNTER — Other Ambulatory Visit: Payer: Self-pay

## 2023-11-19 ENCOUNTER — Other Ambulatory Visit: Payer: Self-pay | Admitting: Pharmacy Technician

## 2023-11-19 NOTE — Progress Notes (Signed)
 Specialty Pharmacy Refill Coordination Note  [name] is a 58 y.o. female contacted today regarding refills of specialty medication(s)   Upadacitinib  (Rinvoq )    Patient requested (Patient-Rptd) Pickup at [facility] at [facility] date: (Patient-Rptd) 11/25/23   Medication will be filled on 11/24/23.

## 2023-11-24 ENCOUNTER — Other Ambulatory Visit: Payer: Self-pay

## 2023-11-25 ENCOUNTER — Other Ambulatory Visit (HOSPITAL_COMMUNITY): Payer: Self-pay

## 2023-11-25 DIAGNOSIS — Z85828 Personal history of other malignant neoplasm of skin: Secondary | ICD-10-CM | POA: Diagnosis not present

## 2023-11-25 DIAGNOSIS — L720 Epidermal cyst: Secondary | ICD-10-CM | POA: Diagnosis not present

## 2023-11-25 DIAGNOSIS — L821 Other seborrheic keratosis: Secondary | ICD-10-CM | POA: Diagnosis not present

## 2023-11-25 DIAGNOSIS — Z08 Encounter for follow-up examination after completed treatment for malignant neoplasm: Secondary | ICD-10-CM | POA: Diagnosis not present

## 2023-11-25 DIAGNOSIS — L718 Other rosacea: Secondary | ICD-10-CM | POA: Diagnosis not present

## 2023-11-25 DIAGNOSIS — L680 Hirsutism: Secondary | ICD-10-CM | POA: Diagnosis not present

## 2023-11-25 DIAGNOSIS — D225 Melanocytic nevi of trunk: Secondary | ICD-10-CM | POA: Diagnosis not present

## 2023-11-25 DIAGNOSIS — D485 Neoplasm of uncertain behavior of skin: Secondary | ICD-10-CM | POA: Diagnosis not present

## 2023-11-25 DIAGNOSIS — L814 Other melanin hyperpigmentation: Secondary | ICD-10-CM | POA: Diagnosis not present

## 2023-11-25 MED ORDER — AZELAIC ACID 15 % EX GEL
CUTANEOUS | 4 refills | Status: AC
  Filled 2023-11-25 – 2024-01-13 (×2): qty 50, 30d supply, fill #0
  Filled 2024-03-21: qty 50, 30d supply, fill #1

## 2023-11-25 MED ORDER — METRONIDAZOLE 0.75 % EX CREA
1.0000 | TOPICAL_CREAM | Freq: Two times a day (BID) | CUTANEOUS | 2 refills | Status: AC
  Filled 2023-11-25: qty 45, 23d supply, fill #0
  Filled 2024-01-13: qty 45, 23d supply, fill #1
  Filled 2024-03-21: qty 45, 23d supply, fill #2

## 2023-12-08 ENCOUNTER — Telehealth: Payer: Self-pay | Admitting: Pharmacist

## 2023-12-08 NOTE — Telephone Encounter (Signed)
 Called patient to schedule an appointment for the [facility]. I was unable to reach the patient so I left a HIPAA-compliant message requesting that the patient return my call.   Herlene Fleeta Morris, PharmD, JAQUELINE, CPP Clinical Pharmacist [facility] [phone]

## 2023-12-14 NOTE — Progress Notes (Unsigned)
 Last PAP: Covid: Flu shot: Pneumonia:   Catheline Doing, DNP, AGNP-c [facility] [address] [place], [place] [address] Main Office [phone] VISIT TYPE: CPE on 12/15/2023 Today's Vitals   12/15/23 0823  BP: 120/78  Pulse: 66  Weight: 167 lb (75.8 kg)  Height: 5' 4.5 (1.638 m)   Body mass index is 28.22 kg/m. BP 120/78   Pulse 66   Ht 5' 4.5 (1.638 m)   Wt 167 lb (75.8 kg)   LMP 01/14/2022 (Approximate)   BMI 28.22 kg/m   Subjective:    Patient ID: [name], female    DOB: 1965-09-07, 58 y.o.   MRN: [id number]  HPI: Seeing Dr. Rutherford for GYN  [name] is here for her annual exam.   She is having concerns with ongoing menopause symptoms including weight gain, mood changes, and hot flashes. She is taking sertraline  50mg  to help with her symptoms, but this is not working as well as she would like. She has discussed HRT with her GYN, Dr. Rutherford, but she feels that this is too much of a risk since her mother had breast cancer thought to be caused by HRT.   Her youngest son is in the marine[employer] and has been struggling some recently, which has been playing a role in her emotional concerns.   She was previously successful in a 20lb weight loss with diet and exercise changes, but she has since gained this back. She reports weight loss is very difficult at this time and she is not sure what else she can do about it.   She is on Rinvoq  for her UC and this is managing her symptoms very well. She is scheduled to see her GI provider in the near future and will be having a f/u colonoscopy this year.   She denies hearing/vision changes, CP, ShOB, palpitations, cough, bowel or bladder changes, LE edema, weakness, or menstrual bleeding.   Pertinent items are noted in HPI.  Most Recent Depression Screen:     12/15/2023    8:22 AM 06/10/2023    9:00 AM 12/23/2022    2:05 PM 11/30/2022    9:12 AM 09/03/2021   10:37 AM  Depression screen PHQ 2/9  Decreased  Interest 0 0 0 0 0  Down, Depressed, Hopeless 0 0 0 0 0  PHQ - 2 Score 0 0 0 0 0  Altered sleeping     0  Tired, decreased energy     0  Change in appetite     0  Feeling bad or failure about yourself      0  Trouble concentrating     0  Moving slowly or fidgety/restless     0  Suicidal thoughts     0  PHQ-9 Score     0  Difficult doing work/chores     Not difficult at all   Most Recent Anxiety Screen:     07/01/2020   10:56 AM  GAD 7 : Generalized Anxiety Score  Nervous, Anxious, on Edge 0  Control/stop worrying 0  Worry too much - different things 1  Trouble relaxing 0  Restless 0  Easily annoyed or irritable 0  Afraid - awful might happen 0  Total GAD 7 Score 1  Anxiety Difficulty Not difficult at all   Most Recent Fall Screen:    12/15/2023    8:21 AM 06/10/2023    9:00 AM 12/23/2022    2:04 PM 11/30/2022    9:12 AM 11/12/2021  10:53 AM  Fall Risk   Falls in the past year? 0 0 0 0 0  Number falls in past yr: 0 0  0 0  Injury with Fall? 0 0  0 0  Risk for fall due to : No Fall Risks No Fall Risks  No Fall Risks No Fall Risks  Follow up Falls evaluation completed Falls evaluation completed  Falls evaluation completed Education provided;Falls evaluation completed      Data saved with a previous flowsheet row definition    Past medical history, surgical history, medications, allergies, family history and social history reviewed with patient today and changes made to appropriate areas of the chart.  Past Medical History:  Past Medical History:  Diagnosis Date   Acute hemorrhagic cystitis 11/26/2022   Acute non-recurrent pansinusitis 04/24/2021   Bladder pain 12/09/2022   DDD (degenerative disc disease), lumbar    S-I joints   Heart murmur    Hyperlipidemia    Superficial granulomatous pyoderma (HCC)    Ulcerative colitis    Medications:  Current Outpatient Medications on File Prior to Visit  Medication Sig   Azelaic Acid  15 % gel Apply once nightly to entire  face   cholecalciferol (VITAMIN D3) 25 MCG (1000 UNIT) tablet Take 1,000 Units by mouth daily. Takes 2   cyanocobalamin  (VITAMIN B12) 1000 MCG/ML injection Inject 1 mL (1,000 mcg total) into the muscle every 30 (thirty) days. Please include syringes and IM needles for injections.   metroNIDAZOLE  (METROCREAM ) 0.75 % cream Apply 1 Application topically 2 (two) times daily to face.   rosuvastatin  (CRESTOR ) 5 MG tablet Take 1 tablet (5 mg total) by mouth daily.   Upadacitinib  ER (RINVOQ ) 30 MG TB24 Take 1 tablet  (30 mg) by mouth daily.   No current facility-administered medications on file prior to visit.   Surgical History:  Past Surgical History:  Procedure Laterality Date   ABDOMINAL HERNIA REPAIR     age 54   COLONOSCOPY  05/12/2012   multiple    POLYPECTOMY     TONSILLECTOMY     Allergies:  Allergies  Allergen Reactions   Doxycycline  Rash   Family History:  Family History  Problem Relation Age of Onset   Macular degeneration Father    Lung cancer Father 21   Breast cancer Mother    Hyperlipidemia Mother    Glaucoma Mother    Colon cancer Neg Hx    Colon polyps Neg Hx    Esophageal cancer Neg Hx    Rectal cancer Neg Hx    Stomach cancer Neg Hx        Objective:    BP 120/78   Pulse 66   Ht 5' 4.5 (1.638 m)   Wt 167 lb (75.8 kg)   LMP 01/14/2022 (Approximate)   BMI 28.22 kg/m   Wt Readings from Last 3 Encounters:  12/15/23 167 lb (75.8 kg)  06/10/23 160 lb 6.4 oz (72.8 kg)  02/05/23 175 lb (79.4 kg)    Physical Exam Vitals and nursing note reviewed.  Constitutional:      General: She is not in acute distress.    Appearance: Normal appearance.  HENT:     Head: Normocephalic and atraumatic.     Right Ear: Hearing, tympanic membrane, ear canal and external ear normal.     Left Ear: Hearing, tympanic membrane, ear canal and external ear normal.     Nose: Nose normal.     Right Sinus: No maxillary sinus tenderness or frontal  sinus tenderness.     Left Sinus:  No maxillary sinus tenderness or frontal sinus tenderness.     Mouth/Throat:     Lips: Pink.     Mouth: Mucous membranes are moist.     Pharynx: Oropharynx is clear.  Eyes:     General: Lids are normal. Vision grossly intact.     Extraocular Movements: Extraocular movements intact.     Conjunctiva/sclera: Conjunctivae normal.     Pupils: Pupils are equal, round, and reactive to light.     Funduscopic exam:    Right eye: Red reflex present.        Left eye: Red reflex present.    Visual Fields: Right eye visual fields normal and left eye visual fields normal.  Neck:     Thyroid : No thyromegaly.     Vascular: No carotid bruit.  Cardiovascular:     Rate and Rhythm: Normal rate and regular rhythm.     Chest Wall: PMI is not displaced.     Pulses: Normal pulses.          Dorsalis pedis pulses are 2+ on the right side and 2+ on the left side.       Posterior tibial pulses are 2+ on the right side and 2+ on the left side.     Heart sounds: Murmur heard.  Pulmonary:     Effort: Pulmonary effort is normal. No respiratory distress.     Breath sounds: Normal breath sounds.  Abdominal:     General: Abdomen is flat. Bowel sounds are normal. There is no distension.     Palpations: Abdomen is soft. There is no hepatomegaly, splenomegaly or mass.     Tenderness: There is no abdominal tenderness. There is no right CVA tenderness, left CVA tenderness, guarding or rebound.  Musculoskeletal:        General: Normal range of motion.     Cervical back: Full passive range of motion without pain, normal range of motion and neck supple. No tenderness.     Right lower leg: No edema.     Left lower leg: No edema.  Feet:     Left foot:     Toenail Condition: Left toenails are normal.  Lymphadenopathy:     Cervical: No cervical adenopathy.     Upper Body:     Right upper body: No supraclavicular adenopathy.     Left upper body: No supraclavicular adenopathy.  Skin:    General: Skin is warm and dry.      Capillary Refill: Capillary refill takes less than 2 seconds.     Nails: There is no clubbing.  Neurological:     General: No focal deficit present.     Mental Status: She is alert and oriented to person, place, and time.     GCS: GCS eye subscore is 4. GCS verbal subscore is 5. GCS motor subscore is 6.     Sensory: Sensation is intact.     Motor: Motor function is intact.     Coordination: Coordination is intact.     Gait: Gait is intact.     Deep Tendon Reflexes: Reflexes are normal and symmetric.  Psychiatric:        Attention and Perception: Attention normal.        Mood and Affect: Mood normal.        Speech: Speech normal.        Behavior: Behavior normal. Behavior is cooperative.        Thought Content:  Thought content normal.        Cognition and Memory: Cognition and memory normal.        Judgment: Judgment normal.     Results for orders placed or performed during the hospital encounter of 10/14/23  POCT rapid strep A   Collection Time: 10/14/23 11:37 AM  Result Value Ref Range   Rapid Strep A Screen Negative Negative  POC Covid19/Flu A&B Antigen   Collection Time: 10/14/23 12:23 PM  Result Value Ref Range   Influenza A Antigen, POC Negative Negative   Influenza B Antigen, POC Negative Negative   Covid Antigen, POC Negative Negative  Culture, group A strep   Collection Time: 10/14/23 12:46 PM   Specimen: Throat  Result Value Ref Range   Specimen Description THROAT    Special Requests NONE    Culture      NO GROUP A STREP (S.PYOGENES) ISOLATED Performed at [facility], [address] [place], [place] [address]    Report Status 10/17/2023 FINAL        Assessment & Plan:   Problem List Items Addressed This Visit     LEFT SIDED ULCERATIVE COLITIS   Followed closely with Dr. Albertus, currently managed with Rinvoq  and doing well. No concerns present today.       Pyoderma gangrenosum (HCC)   Last wound in 2018. No concerns present at this time. Will  continue to monitor.       Mixed hyperlipidemia   Elevated cholesterol levels managed with Crestor . Recent cardiac CT showed a score of 40, with plaque primarily in the LAD. Crestor  is expected to help reduce plaque, as it is known to lower cholesterol and potentially reduce arterial plaque. Continued monitoring of lipid levels is necessary to assess the effectiveness of the therapy. - Continue Crestor  therapy - Order lipid panel      Relevant Orders   Lipid panel   Encounter for annual physical exam - Primary   CPE completed today. Review of HM activities and recommendations discussed and provided on AVS. Anticipatory guidance, diet, and exercise recommendations provided. Medications, allergies, and hx reviewed and updated as necessary. Orders placed as listed below.  Plan: - Labs ordered. Will make changes as necessary based on results.  - I will review these results and send recommendations via MyChart or a telephone call.  - F/U with CPE in 1 year or sooner for acute/chronic health needs as directed.        B12 deficiency   Chronic. Currently on replacement injections. Monitoring today      Relevant Orders   Vitamin B12   Menopause syndrome   Ongoing symptoms of hot flashes/night sweats, mood swings, weight gain (central), and fatigue. Sertraline  50mg  has not had much improvement on her symptoms. We discussed the option to increase sertraline . Also discussed trial of wellbutrin to see if this helps with weight gain piece of symptoms. She will let me know if she has any changes.       Relevant Medications   sertraline  (ZOLOFT ) 50 MG tablet   buPROPion (WELLBUTRIN XL) 150 MG 24 hr tablet   Vitamin D  deficiency   Chronic. Managed with OTC replacement. Labs pending.       Relevant Orders   VITAMIN D  25 Hydroxy (Vit-D Deficiency, Fractures)   Pre-diabetes   Previous elevation in A1c. Recent weight gain may indicate worsening insulin  resistance. Will monitor labs today.  Encourage continued weight management       Relevant Orders  CBC with Differential/Platelet   CMP14+EGFR   Hemoglobin A1c   Other Visit Diagnoses       Situational stress       Relevant Medications   sertraline  (ZOLOFT ) 50 MG tablet   buPROPion (WELLBUTRIN XL) 150 MG 24 hr tablet     Screening for endocrine, nutritional, metabolic and immunity disorder       Relevant Orders   CBC with Differential/Platelet   CMP14+EGFR   Hemoglobin A1c   Lipid panel     Need for prophylactic vaccination with Streptococcus pneumoniae (Pneumococcus) and Influenza vaccines       Relevant Orders   Pneumococcal conjugate vaccine 20-valent (Prevnar 20) (Completed)        Follow up plan: Return in about 6 months (around 06/14/2024) for Med Management 30.  NEXT PREVENTATIVE PHYSICAL DUE IN 1 YEAR.  PATIENT COUNSELING PROVIDED FOR ALL ADULT PATIENTS: A well balanced diet low in saturated fats, cholesterol, and moderation in carbohydrates.  This can be as simple as monitoring portion sizes and cutting back on sugary beverages such as soda and juice to start with.    Daily water consumption of at least 64 ounces.  Physical activity at least 180 minutes per week.  If just starting out, start 10 minutes a day and work your way up.   This can be as simple as taking the stairs instead of the elevator and walking 2-3 laps around the office  purposefully every day.   STD protection, partner selection, and regular testing if high risk.  Limited consumption of alcoholic beverages if alcohol is consumed. For men, I recommend no more than 14 alcoholic beverages per week, spread out throughout the week (max 2 per day). Avoid binge drinking or consuming large quantities of alcohol in one setting.  Please let me know if you feel you may need help with reduction or quitting alcohol consumption.   Avoidance of nicotine, if used. Please let me know if you feel you may need help with reduction or quitting  nicotine use.   Daily mental health attention. This can be in the form of 5 minute daily meditation, prayer, journaling, yoga, reflection, etc.  Purposeful attention to your emotions and mental state can significantly improve your overall wellbeing  and  Health.  Please know that I am here to help you with all of your health care goals and am happy to work with you to find a solution that works best for you.  The greatest advice I have received with any changes in life are to take it one step at a time, that even means if all you can focus on is the next 60 seconds, then do that and celebrate your victories.  With any changes in life, you will have set backs, and that is OK. The important thing to remember is, if you have a set back, it is not a failure, it is an opportunity to try again! Screening Testing Mammogram Every 1 -2 years based on history and risk factors Starting at age 10 Pap Smear Ages 21-39 every 3 years Ages 55-65 every 5 years with HPV testing More frequent testing may be required based on results and history Colon Cancer Screening Every 1-10 years based on test performed, risk factors, and history Starting at age 67 Bone Density Screening Every 2-10 years based on history Starting at age 28 for women Recommendations for men differ based on medication usage, history, and risk factors AAA Screening One time ultrasound Men 65-75 years  old who have every smoked Lung Cancer Screening Low Dose Lung CT every 12 months Age 29-80 years with a 30 pack-year smoking history who still smoke or who have quit within the last 15 years   Screening Labs Routine  Labs: Complete Blood Count (CBC), Complete Metabolic Panel (CMP), Cholesterol (Lipid Panel) Every 6-12 months based on history and medications May be recommended more frequently based on current conditions or previous results Hemoglobin A1c Lab Every 3-12 months based on history and previous results Starting at age 85 or  earlier with diagnosis of diabetes, high cholesterol, BMI >26, and/or risk factors Frequent monitoring for patients with diabetes to ensure blood sugar control Thyroid  Panel (TSH) Every 6 months based on history, symptoms, and risk factors May be repeated more often if on medication HIV One time testing for all patients 66 and older May be repeated more frequently for patients with increased risk factors or exposure Hepatitis C One time testing for all patients 36 and older May be repeated more frequently for patients with increased risk factors or exposure Gonorrhea, Chlamydia Every 12 months for all sexually active persons 13-24 years Additional monitoring may be recommended for those who are considered high risk or who have symptoms Every 12 months for any woman on birth control, regardless of sexual activity PSA Men 39-23 years old with risk factors Additional screening may be recommended from age 35-69 based on risk factors, symptoms, and history  Vaccine Recommendations Tetanus Booster All adults every 10 years Flu Vaccine All patients 6 months and older every year COVID Vaccine All patients 12 years and older Initial dosing with booster May recommend additional booster based on age and health history HPV Vaccine 2 doses all patients age 64-26 Dosing may be considered for patients over 26 Shingles Vaccine (Shingrix) 2 doses all adults 55 years and older Pneumonia (Pneumovax 23) All adults 65 years and older May recommend earlier dosing based on health history One year apart from Prevnar 13 Pneumonia (Prevnar 81) All adults 65 years and older Dosed 1 year after Pneumovax 23 Pneumonia (Prevnar 20) One time alternative to the two dosing of 13 and 23 For all adults with initial dose of 23, 20 is recommended 1 year later For all adults with initial dose of 13, 23 is still recommended as second option 1 year later

## 2023-12-15 ENCOUNTER — Other Ambulatory Visit (HOSPITAL_COMMUNITY): Payer: Self-pay

## 2023-12-15 ENCOUNTER — Encounter: Payer: Self-pay | Admitting: Nurse Practitioner

## 2023-12-15 ENCOUNTER — Ambulatory Visit: Payer: 59 | Admitting: Nurse Practitioner

## 2023-12-15 ENCOUNTER — Other Ambulatory Visit: Payer: Self-pay

## 2023-12-15 VITALS — BP 120/78 | HR 66 | Ht 64.5 in | Wt 167.0 lb

## 2023-12-15 DIAGNOSIS — E559 Vitamin D deficiency, unspecified: Secondary | ICD-10-CM

## 2023-12-15 DIAGNOSIS — Z130 Encounter for screening for diseases of the blood and blood-forming organs and certain disorders involving the immune mechanism: Secondary | ICD-10-CM | POA: Diagnosis not present

## 2023-12-15 DIAGNOSIS — Z23 Encounter for immunization: Secondary | ICD-10-CM

## 2023-12-15 DIAGNOSIS — Z13228 Encounter for screening for other metabolic disorders: Secondary | ICD-10-CM | POA: Diagnosis not present

## 2023-12-15 DIAGNOSIS — L88 Pyoderma gangrenosum: Secondary | ICD-10-CM

## 2023-12-15 DIAGNOSIS — N951 Menopausal and female climacteric states: Secondary | ICD-10-CM

## 2023-12-15 DIAGNOSIS — K5150 Left sided colitis without complications: Secondary | ICD-10-CM

## 2023-12-15 DIAGNOSIS — Z0000 Encounter for general adult medical examination without abnormal findings: Secondary | ICD-10-CM | POA: Diagnosis not present

## 2023-12-15 DIAGNOSIS — E782 Mixed hyperlipidemia: Secondary | ICD-10-CM

## 2023-12-15 DIAGNOSIS — Z1321 Encounter for screening for nutritional disorder: Secondary | ICD-10-CM | POA: Diagnosis not present

## 2023-12-15 DIAGNOSIS — E538 Deficiency of other specified B group vitamins: Secondary | ICD-10-CM | POA: Diagnosis not present

## 2023-12-15 DIAGNOSIS — R7303 Prediabetes: Secondary | ICD-10-CM | POA: Diagnosis not present

## 2023-12-15 DIAGNOSIS — F439 Reaction to severe stress, unspecified: Secondary | ICD-10-CM | POA: Diagnosis not present

## 2023-12-15 DIAGNOSIS — Z1329 Encounter for screening for other suspected endocrine disorder: Secondary | ICD-10-CM

## 2023-12-15 LAB — LIPID PANEL

## 2023-12-15 MED ORDER — SERTRALINE HCL 50 MG PO TABS
100.0000 mg | ORAL_TABLET | Freq: Every day | ORAL | 1 refills | Status: AC
  Filled 2023-12-15 – 2024-01-13 (×2): qty 180, 90d supply, fill #0

## 2023-12-15 MED ORDER — BUPROPION HCL ER (XL) 150 MG PO TB24
150.0000 mg | ORAL_TABLET | Freq: Every day | ORAL | 3 refills | Status: AC
  Filled 2023-12-15: qty 90, 90d supply, fill #0
  Filled 2024-03-06: qty 90, 90d supply, fill #1

## 2023-12-15 NOTE — Assessment & Plan Note (Signed)
 Last wound in 2018. No concerns present at this time. Will continue to monitor.

## 2023-12-15 NOTE — Assessment & Plan Note (Signed)
 Ongoing symptoms of hot flashes/night sweats, mood swings, weight gain (central), and fatigue. Sertraline  50mg  has not had much improvement on her symptoms. We discussed the option to increase sertraline . Also discussed trial of wellbutrin to see if this helps with weight gain piece of symptoms. She will let me know if she has any changes.

## 2023-12-15 NOTE — Assessment & Plan Note (Signed)
 Followed closely with Dr. Albertus, currently managed with Rinvoq  and doing well. No concerns present today.

## 2023-12-15 NOTE — Assessment & Plan Note (Signed)
 Chronic. Managed with OTC replacement. Labs pending.

## 2023-12-15 NOTE — Patient Instructions (Addendum)
 I have sent in the wellbutrin. We will start with 150mg  once a day and see how you feel on that.  You can stay on the sertraline  50mg  for a couple of weeks to see if the wellbutrin helps with your symptoms, but if not, you can go up to 75mg  or even 100mg . I have written it to give you the flexibility.   If you have any side effects you can stop the wellbutrin without a taper and let me know.   For all adult patients, I recommend A well balanced diet low in saturated fats, cholesterol, and moderation in carbohydrates.   This can be as simple as monitoring portion sizes and cutting back on sugary beverages such as soda and juice to start with.    Daily water consumption of at least 64 ounces.  Physical activity at least 180 minutes per week, if just starting out.   This can be as simple as taking the stairs instead of the elevator and walking 2-3 laps around the office  purposefully every day.   STD protection, partner selection, and regular testing if high risk.  Limited consumption of alcoholic beverages if alcohol is consumed.  For women, I recommend no more than 7 alcoholic beverages per week, spread out throughout the week.  Avoid binge drinking or consuming large quantities of alcohol in one setting.   Please let me know if you feel you may need help with reduction or quitting alcohol consumption.   Avoidance of nicotine, if used.  Please let me know if you feel you may need help with reduction or quitting nicotine use.   Daily mental health attention.  This can be in the form of 5 minute daily meditation, prayer, journaling, yoga, reflection, etc.   Purposeful attention to your emotions and mental state can significantly improve your overall wellbeing  and  Health.  Please know that I am here to help you with all of your health care goals and am happy to work with you to find a solution that works best for you.  The greatest advice I have received with any changes in life are to  take it one step at a time, that even means if all you can focus on is the next 60 seconds, then do that and celebrate your victories.  With any changes in life, you will have set backs, and that is OK. The important thing to remember is, if you have a set back, it is not a failure, it is an opportunity to try again!  Health Maintenance Recommendations Screening Testing Mammogram Every 1 -2 years based on history and risk factors Starting at age 19 Pap Smear Ages 21-39 every 3 years Ages 62-65 every 5 years with HPV testing More frequent testing may be required based on results and history Colon Cancer Screening Every 1-10 years based on test performed, risk factors, and history Starting at age 16 Bone Density Screening Every 2-10 years based on history Starting at age 41 for women Recommendations for men differ based on medication usage, history, and risk factors AAA Screening One time ultrasound Men 19-26 years old who have every smoked Lung Cancer Screening Low Dose Lung CT every 12 months Age 79-80 years with a 30 pack-year smoking history who still smoke or who have quit within the last 15 years  Screening Labs Routine  Labs: Complete Blood Count (CBC), Complete Metabolic Panel (CMP), Cholesterol (Lipid Panel) Every 6-12 months based on history and medications May be recommended more  frequently based on current conditions or previous results Hemoglobin A1c Lab Every 3-12 months based on history and previous results Starting at age 54 or earlier with diagnosis of diabetes, high cholesterol, BMI >26, and/or risk factors Frequent monitoring for patients with diabetes to ensure blood sugar control Thyroid  Panel (TSH w/ T3 & T4) Every 6 months based on history, symptoms, and risk factors May be repeated more often if on medication HIV One time testing for all patients 92 and older May be repeated more frequently for patients with increased risk factors or exposure Hepatitis  C One time testing for all patients 30 and older May be repeated more frequently for patients with increased risk factors or exposure Gonorrhea, Chlamydia Every 12 months for all sexually active persons 13-24 years Additional monitoring may be recommended for those who are considered high risk or who have symptoms PSA Men 87-65 years old with risk factors Additional screening may be recommended from age 23-69 based on risk factors, symptoms, and history  Vaccine Recommendations Tetanus Booster All adults every 10 years Flu Vaccine All patients 6 months and older every year COVID Vaccine All patients 12 years and older Initial dosing with booster May recommend additional booster based on age and health history HPV Vaccine 2 doses all patients age 58-26 Dosing may be considered for patients over 26 Shingles Vaccine (Shingrix) 2 doses all adults 55 years and older Pneumonia (Pneumovax 23) All adults 65 years and older May recommend earlier dosing based on health history Pneumonia (Prevnar 29) All adults 65 years and older Dosed 1 year after Pneumovax 23  Additional Screening, Testing, and Vaccinations may be recommended on an individualized basis based on family history, health history, risk factors, and/or exposure.

## 2023-12-15 NOTE — Assessment & Plan Note (Signed)
Chronic. Currently on replacement injections. Monitoring today

## 2023-12-15 NOTE — Assessment & Plan Note (Signed)
 Previous elevation in A1c. Recent weight gain may indicate worsening insulin  resistance. Will monitor labs today. Encourage continued weight management

## 2023-12-15 NOTE — Assessment & Plan Note (Signed)
 Elevated cholesterol levels managed with Crestor. Recent cardiac CT showed a score of 40, with plaque primarily in the LAD. Crestor is expected to help reduce plaque, as it is known to lower cholesterol and potentially reduce arterial plaque. Continued monitoring of lipid levels is necessary to assess the effectiveness of the therapy. - Continue Crestor therapy - Order lipid panel

## 2023-12-15 NOTE — Assessment & Plan Note (Signed)

## 2023-12-16 ENCOUNTER — Other Ambulatory Visit: Payer: Self-pay

## 2023-12-16 LAB — CBC WITH DIFFERENTIAL/PLATELET
Basophils Absolute: 0 x10E3/uL (ref 0.0–0.2)
Basos: 1 %
EOS (ABSOLUTE): 0.1 x10E3/uL (ref 0.0–0.4)
Eos: 2 %
Hematocrit: 40.2 % (ref 34.0–46.6)
Hemoglobin: 13.2 g/dL (ref 11.1–15.9)
Immature Grans (Abs): 0 x10E3/uL (ref 0.0–0.1)
Immature Granulocytes: 0 %
Lymphocytes Absolute: 1.1 x10E3/uL (ref 0.7–3.1)
Lymphs: 37 %
MCH: 30.5 pg (ref 26.6–33.0)
MCHC: 32.8 g/dL (ref 31.5–35.7)
MCV: 93 fL (ref 79–97)
Monocytes Absolute: 0.3 x10E3/uL (ref 0.1–0.9)
Monocytes: 8 %
Neutrophils Absolute: 1.6 x10E3/uL (ref 1.4–7.0)
Neutrophils: 51 %
Platelets: 338 x10E3/uL (ref 150–450)
RBC: 4.33 x10E6/uL (ref 3.77–5.28)
RDW: 13 % (ref 11.7–15.4)
WBC: 3 x10E3/uL — ABNORMAL LOW (ref 3.4–10.8)

## 2023-12-16 LAB — CMP14+EGFR
ALT: 20 IU/L (ref 0–32)
AST: 28 IU/L (ref 0–40)
Albumin: 5 g/dL — ABNORMAL HIGH (ref 3.8–4.9)
Alkaline Phosphatase: 80 IU/L (ref 49–135)
BUN/Creatinine Ratio: 21 (ref 9–23)
BUN: 16 mg/dL (ref 6–24)
Bilirubin Total: 0.4 mg/dL (ref 0.0–1.2)
CO2: 21 mmol/L (ref 20–29)
Calcium: 9.7 mg/dL (ref 8.7–10.2)
Chloride: 102 mmol/L (ref 96–106)
Creatinine, Ser: 0.78 mg/dL (ref 0.57–1.00)
Globulin, Total: 2.5 g/dL (ref 1.5–4.5)
Glucose: 91 mg/dL (ref 70–99)
Potassium: 4.5 mmol/L (ref 3.5–5.2)
Sodium: 140 mmol/L (ref 134–144)
Total Protein: 7.5 g/dL (ref 6.0–8.5)
eGFR: 89 mL/min/1.73 (ref >59)

## 2023-12-16 LAB — LIPID PANEL
Cholesterol, Total: 286 mg/dL — AB (ref 100–199)
HDL: 83 mg/dL (ref >39)
LDL CALC COMMENT:: 3.4 ratio (ref 0.0–4.4)
LDL Chol Calc (NIH): 184 mg/dL — AB (ref 0–99)
Triglycerides: 112 mg/dL (ref 0–149)
VLDL Cholesterol Cal: 19 mg/dL (ref 5–40)

## 2023-12-16 LAB — HEMOGLOBIN A1C
Est. average glucose Bld gHb Est-mCnc: 114 mg/dL
Hgb A1c MFr Bld: 5.6 % (ref 4.8–5.6)

## 2023-12-16 LAB — VITAMIN B12: Vitamin B-12: 555 pg/mL (ref 232–1245)

## 2023-12-16 LAB — VITAMIN D 25 HYDROXY (VIT D DEFICIENCY, FRACTURES): Vit D, 25-Hydroxy: 46.4 ng/mL (ref 30.0–100.0)

## 2023-12-20 ENCOUNTER — Other Ambulatory Visit: Payer: Self-pay

## 2023-12-20 ENCOUNTER — Other Ambulatory Visit (HOSPITAL_COMMUNITY): Payer: Self-pay

## 2023-12-20 ENCOUNTER — Encounter (INDEPENDENT_AMBULATORY_CARE_PROVIDER_SITE_OTHER): Payer: Self-pay

## 2023-12-20 NOTE — Progress Notes (Signed)
 Specialty Pharmacy Refill Coordination Note  MyChart Questionnaire Submission  [name] is a 58 y.o. female contacted today regarding refills of specialty medication(s) Rinvoq .  Doses on hand: (Patient-Rptd) 14   Patient requested: (Patient-Rptd) Pickup at [facility] date: 12/23/23  Medication will be filled on 12/22/23.

## 2023-12-21 ENCOUNTER — Other Ambulatory Visit: Payer: Self-pay

## 2023-12-22 ENCOUNTER — Ambulatory Visit: Payer: Self-pay | Admitting: Nurse Practitioner

## 2023-12-30 ENCOUNTER — Other Ambulatory Visit: Payer: Self-pay | Admitting: Obstetrics and Gynecology

## 2023-12-30 DIAGNOSIS — Z1231 Encounter for screening mammogram for malignant neoplasm of breast: Secondary | ICD-10-CM

## 2024-01-13 ENCOUNTER — Other Ambulatory Visit: Payer: Self-pay

## 2024-01-13 ENCOUNTER — Other Ambulatory Visit (HOSPITAL_COMMUNITY): Payer: Self-pay

## 2024-01-13 ENCOUNTER — Encounter (INDEPENDENT_AMBULATORY_CARE_PROVIDER_SITE_OTHER): Payer: Self-pay

## 2024-01-13 NOTE — Progress Notes (Signed)
 Clinical Intervention Note  Clinical Intervention Notes: Patient reported starting Wellbutrin, no DDIs were identified with her Rinvoq .   Clinical Intervention Outcomes: Prevention of an adverse drug event   [name] [name] [name]

## 2024-01-13 NOTE — Progress Notes (Signed)
 Specialty Pharmacy Refill Coordination Note  [name] is a 58 y.o. female contacted today regarding refills of specialty medication(s) Upadacitinib  (Rinvoq )   Patient requested [name] at [facility] [place] date: 01/19/24   Medication will be filled on: 01/18/24

## 2024-01-15 ENCOUNTER — Other Ambulatory Visit (HOSPITAL_BASED_OUTPATIENT_CLINIC_OR_DEPARTMENT_OTHER): Payer: Self-pay | Admitting: Nurse Practitioner

## 2024-01-15 ENCOUNTER — Other Ambulatory Visit (HOSPITAL_COMMUNITY): Payer: Self-pay

## 2024-01-15 DIAGNOSIS — E663 Overweight: Secondary | ICD-10-CM

## 2024-01-18 ENCOUNTER — Other Ambulatory Visit: Payer: Self-pay

## 2024-01-19 ENCOUNTER — Ambulatory Visit: Admitting: Gastroenterology

## 2024-01-19 ENCOUNTER — Other Ambulatory Visit (HOSPITAL_COMMUNITY): Payer: Self-pay

## 2024-01-19 ENCOUNTER — Other Ambulatory Visit

## 2024-01-19 ENCOUNTER — Encounter: Payer: Self-pay | Admitting: Gastroenterology

## 2024-01-19 VITALS — BP 118/68 | HR 61 | Ht 64.5 in | Wt 169.0 lb

## 2024-01-19 DIAGNOSIS — Z872 Personal history of diseases of the skin and subcutaneous tissue: Secondary | ICD-10-CM

## 2024-01-19 DIAGNOSIS — K5150 Left sided colitis without complications: Secondary | ICD-10-CM | POA: Diagnosis not present

## 2024-01-19 DIAGNOSIS — E782 Mixed hyperlipidemia: Secondary | ICD-10-CM

## 2024-01-19 DIAGNOSIS — M0760 Enteropathic arthropathies, unspecified site: Secondary | ICD-10-CM

## 2024-01-19 DIAGNOSIS — K639 Disease of intestine, unspecified: Secondary | ICD-10-CM

## 2024-01-19 MED ORDER — SUTAB 1479-225-188 MG PO TABS
24.0000 | ORAL_TABLET | ORAL | 0 refills | Status: DC
  Filled 2024-01-19: qty 24, 1d supply, fill #0

## 2024-01-19 NOTE — Progress Notes (Signed)
 Chief Complaint: Recall colonoscopy Primary GI MD: Dr. 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   Colonoscopy  12/2020 - Preparation of the colon was fair.  - Inactive ( Mayo Score 0) ulcerative colitis, in remission, unchanged since the last examination. Biopsied.  - Small internal hemorrhoids.  Discussed the use of AI scribe software for clinical note transcription with the patient, who gave verbal consent to proceed.  She is in complete remission from ulcerative colitis with no active symptoms. Her last colonoscopy showed remission, and her fecal calprotectin levels have significantly improved.  She is currently on Rinvoq , a daily pill, which she finds convenient. Previously, she was on Simponi , which initially was effective but later lost efficacy. She has one to two bowel movements per day, which are soft and formed. No abdominal pain, nausea, vomiting, blood in stool, black stools, or weight loss.  There is no family history of colon cancer. She is not experiencing constipation and has no other significant gastrointestinal symptoms.  She is also on Crestor  for cholesterol management, as prescribed by her primary care physician. She reports good health with normal blood pressure and no diabetes. 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 She has two sons in the [employer].   PREVIOUS GI WORKUP   Colonoscopy  12/2020 - Preparation of the colon was fair.  - Inactive ( Mayo Score 0) ulcerative colitis, in remission, unchanged since the last examination. Biopsied.  - Small internal hemorrhoids.  Past Medical History:  Diagnosis Date   Acute hemorrhagic cystitis 11/26/2022   Acute non-recurrent pansinusitis 04/24/2021   Bladder pain 12/09/2022   DDD (degenerative disc disease),  lumbar    S-I joints   Heart murmur    Hyperlipidemia    Superficial granulomatous pyoderma (HCC)    Ulcerative colitis     Past Surgical History:  Procedure Laterality Date   ABDOMINAL HERNIA REPAIR     age 82   COLONOSCOPY  05/12/2012   multiple    POLYPECTOMY     TONSILLECTOMY      Current Outpatient Medications  Medication Sig Dispense Refill   Azelaic Acid  15 % gel Apply once nightly to entire face 50 g 4   buPROPion (WELLBUTRIN XL) 150 MG 24 hr tablet Take 1 tablet (150 mg total) by mouth daily. 90 tablet 3   cholecalciferol (VITAMIN D3) 25 MCG (1000 UNIT) tablet Take 1,000 Units by mouth daily. Takes 2     cyanocobalamin  (VITAMIN B12) 1000 MCG/ML injection Inject 1 mL (1,000 mcg total) into the muscle every 30 (thirty) days. Please include syringes and IM needles for injections. 3 mL 3   metroNIDAZOLE  (METROCREAM ) 0.75 % cream Apply 1 Application topically 2 (two) times daily to face. 45 g 2   rosuvastatin  (CRESTOR ) 5 MG tablet Take 1 tablet (5 mg total) by mouth daily. 90 tablet 3   sertraline  (ZOLOFT ) 50 MG tablet Take 2 tablets (100 mg total) by mouth daily. 180 tablet 1   Upadacitinib  ER (RINVOQ ) 30 MG TB24 Take 1 tablet  (30 mg) by mouth daily. 30 tablet 11   No current facility-administered medications for this visit.    Allergies as of 01/19/2024 - Review Complete 01/19/2024  Allergen Reaction Noted   Doxycycline  Rash 03/31/2016    Family History  Problem Relation Age of Onset  Macular degeneration Father    Lung cancer Father 50   Breast cancer Mother    Hyperlipidemia Mother    Glaucoma Mother    Colon cancer Neg Hx    Colon polyps Neg Hx    Esophageal cancer Neg Hx    Rectal cancer Neg Hx    Stomach cancer Neg Hx     Social History   Socioeconomic History   Marital status: Single    Spouse name: Not on file   Number of children: 2   Years of education: Not on file   Highest education level: Not on file  Occupational History   Occupation:  Nurse, [employer]   Tobacco Use   Smoking status: Never    Passive exposure: Never   Smokeless tobacco: Never  Vaping Use   Vaping status: Never Used  Substance and Sexual Activity   Alcohol use: Yes    Comment: rare   Drug use: No   Sexual activity: Not on file  Other Topics Concern   Not on file  Social History Narrative   Not on file   Social Drivers of Health   Financial Resource Strain: Low Risk  (11/30/2022)   Overall Financial Resource Strain (CARDIA)    Difficulty of Paying Living Expenses: Not hard at all  Food Insecurity: No Food Insecurity (12/23/2022)   Hunger Vital Sign    Worried About Running Out of Food in the Last Year: Never true    Ran Out of Food in the Last Year: Never true  Transportation Needs: No Transportation Needs (11/30/2022)   PRAPARE - Administrator, Civil Service (Medical): No    Lack of Transportation (Non-Medical): No  Physical Activity: Insufficiently Active (11/30/2022)   Exercise Vital Sign    Days of Exercise per Week: 3 days    Minutes of Exercise per Session: 40 min  Stress: No Stress Concern Present (11/30/2022)   [employer] of Occupational Health - Occupational Stress Questionnaire    Feeling of Stress : Only a little  Social Connections: Moderately Isolated (11/30/2022)   Social Connection and Isolation Panel    Frequency of Communication with Friends and Family: More than three times a week    Frequency of Social Gatherings with Friends and Family: Once a week    Attends Religious Services: 1 to 4 times per year    Active Member of [employer] or Organizations: No    Attends Banker Meetings: Patient unable to answer    Marital Status: Divorced  Catering Manager Violence: Not At Risk (11/30/2022)   Humiliation, Afraid, Rape, and Kick questionnaire    Fear of Current or Ex-Partner: No    Emotionally Abused: No    Physically Abused: No    Sexually Abused: No    Review of Systems:    Constitutional:  No weight loss, fever, chills, weakness or fatigue HEENT: Eyes: No change in vision               Ears, Nose, Throat:  No change in hearing or congestion Skin: No rash or itching Cardiovascular: No chest pain, chest pressure or palpitations   Respiratory: No SOB or cough Gastrointestinal: See HPI and otherwise negative Genitourinary: No dysuria or change in urinary frequency Neurological: No headache, dizziness or syncope Musculoskeletal: No new muscle or joint pain Hematologic: No bleeding or bruising Psychiatric: No history of depression or anxiety    Physical Exam:  Vital signs: BP 118/68   Pulse 61  Ht 5' 4.5 (1.638 m)   Wt 169 lb (76.7 kg)   BMI 28.56 kg/m   Constitutional: NAD, alert and cooperative Head:  Normocephalic and atraumatic. Eyes:   PEERL, EOMI. No icterus. Conjunctiva pink. Respiratory: Respirations even and unlabored. Lungs clear to auscultation bilaterally.   No wheezes, crackles, or rhonchi.  Cardiovascular:  Regular rate and rhythm. No peripheral edema, cyanosis or pallor.  Gastrointestinal:  Soft, nondistended, nontender. No rebound or guarding. Normal bowel sounds. No appreciable masses or hepatomegaly. Rectal:  Declines Msk:  Symmetrical without gross deformities. Without edema, no deformity or joint abnormality.  Neurologic:  Alert and  oriented x4;  grossly normal neurologically.  Skin:   Dry and intact without significant lesions or rashes. Psychiatric: Oriented to person, place and time. Demonstrates good judgement and reason without abnormal affect or behaviors.  RELEVANT LABS AND IMAGING: CBC    Component Value Date/Time   WBC 3.0 (L) 12/15/2023 1128   WBC 4.3 04/07/2022 0853   RBC 4.33 12/15/2023 1128   RBC 3.89 04/07/2022 0853   HGB 13.2 12/15/2023 1128   HCT 40.2 12/15/2023 1128   PLT 338 12/15/2023 1128   MCV 93 12/15/2023 1128   MCH 30.5 12/15/2023 1128   MCH 29.9 08/04/2019 0914   MCHC 32.8 12/15/2023 1128   MCHC 34.3 04/07/2022  0853   RDW 13.0 12/15/2023 1128   LYMPHSABS 1.1 12/15/2023 1128   MONOABS 0.7 04/07/2022 0853   EOSABS 0.1 12/15/2023 1128   BASOSABS 0.0 12/15/2023 1128    CMP     Component Value Date/Time   NA 140 12/15/2023 1128   K 4.5 12/15/2023 1128   CL 102 12/15/2023 1128   CO2 21 12/15/2023 1128   GLUCOSE 91 12/15/2023 1128   GLUCOSE 83 04/07/2022 0853   BUN 16 12/15/2023 1128   CREATININE 0.78 12/15/2023 1128   CREATININE 0.62 03/22/2020 1040   CALCIUM  9.7 12/15/2023 1128   PROT 7.5 12/15/2023 1128   ALBUMIN 5.0 (H) 12/15/2023 1128   AST 28 12/15/2023 1128   ALT 20 12/15/2023 1128   ALKPHOS 80 12/15/2023 1128   BILITOT 0.4 12/15/2023 1128   GFRNONAA 90 09/02/2017 0925   GFRAA 104 09/02/2017 0925     Assessment/Plan:   58 year old female with longstanding ulcerative colitis (dx age 50), history of pyoderma gangrenosum, 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   Left-sided ulcerative colitis Dx 1990s.  Simponi  stopped due to lack of response and low drug level without antibodies in 2023.  Since starting on Rinvoq  late 2023 patient has clinically been doing well. last colonoscopy 2022 showing endoscopic remission.  Continues to be doing well from a clinical standpoint and last fecal calprotectin (2024) was 6. -Continue Rinvoq  30 Mg daily - Will repeat fecal calprotectin - Annual follow-up with dermatology recommended - Schedule surveillance colonoscopy - Patient requests Sutab for prep - I thoroughly discussed the procedure with the patient (at bedside) to include nature of the procedure, alternatives, benefits, and risks (including but not limited to bleeding, infection, perforation, anesthesia/cardiac pulmonary complications).  Patient verbalized understanding and gave verbal consent to proceed with procedure.   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

## 2024-01-19 NOTE — Patient Instructions (Addendum)
 _______________________________________________________  If your blood pressure at your visit was 140/90 or greater, please contact your primary care physician to follow up on this.  _______________________________________________________  If you are age 56 or older, your body mass index should be between 23-30. Your Body mass index is 28.56 kg/m. If this is out of the aforementioned range listed, please consider follow up with your Primary Care Provider.  If you are age 43 or younger, your body mass index should be between 19-25. Your Body mass index is 28.56 kg/m. If this is out of the aformentioned range listed, please consider follow up with your Primary Care Provider.   ________________________________________________________  The [facility] providers would like to encourage you to use MYCHART to communicate with providers for non-urgent requests or questions.  Due to long hold times on the telephone, sending your provider a message by [facility] may be a faster and more efficient way to get a response.  Please allow 48 business hours for a response.  Please remember that this is for non-urgent requests.  _______________________________________________________  [facility] is using a team-based approach to care.  Your team is made up of your doctor and two to three APPS. Our APPS (Nurse Practitioners and Physician Assistants) work with your physician to ensure care continuity for you. They are fully qualified to address your health concerns and develop a treatment plan. They communicate directly with your gastroenterologist to care for you. Seeing the Advanced Practice Practitioners on your physician's team can help you by facilitating care more promptly, often allowing for earlier appointments, access to diagnostic testing, procedures, and other specialty referrals.   Your provider has requested that you go to the basement level for lab work before leaving today. Press B on the  elevator. The lab is located at the first door on the left as you exit the elevator.  We have sent the following medications to your pharmacy for you to pick up at your convenience: Sutab  You have been scheduled for a colonoscopy. Please follow written instructions given to you at your visit today.   If you use inhalers (even only as needed), please bring them with you on the day of your procedure.  DO NOT TAKE 7 DAYS PRIOR TO TEST- Trulicity (dulaglutide) Ozempic , Wegovy  (semaglutide ) Mounjaro (tirzepatide) Bydureon Bcise (exanatide extended release)  DO NOT TAKE 1 DAY PRIOR TO YOUR TEST Rybelsus  (semaglutide ) Adlyxin (lixisenatide) Victoza  (liraglutide ) Byetta (exanatide) ___________________________________________________________________________  It was a pleasure to see you today!  Thank you for trusting me with your gastrointestinal care!

## 2024-01-20 ENCOUNTER — Other Ambulatory Visit

## 2024-01-20 DIAGNOSIS — K5150 Left sided colitis without complications: Secondary | ICD-10-CM

## 2024-01-25 ENCOUNTER — Ambulatory Visit: Payer: Self-pay | Admitting: Gastroenterology

## 2024-01-25 LAB — CALPROTECTIN, FECAL: Calprotectin, Fecal: 12 ug/g (ref 0–120)

## 2024-01-26 ENCOUNTER — Encounter: Payer: Self-pay | Admitting: Internal Medicine

## 2024-01-28 ENCOUNTER — Encounter: Payer: Self-pay | Admitting: Nurse Practitioner

## 2024-01-28 DIAGNOSIS — E782 Mixed hyperlipidemia: Secondary | ICD-10-CM

## 2024-01-28 DIAGNOSIS — I2510 Atherosclerotic heart disease of native coronary artery without angina pectoris: Secondary | ICD-10-CM

## 2024-01-31 NOTE — Telephone Encounter (Signed)
 Orders have been placed for lipoprotein A and Apolipoprotein B =.

## 2024-02-02 ENCOUNTER — Encounter: Payer: Self-pay | Admitting: Internal Medicine

## 2024-02-02 ENCOUNTER — Ambulatory Visit (AMBULATORY_SURGERY_CENTER): Admitting: Internal Medicine

## 2024-02-02 VITALS — BP 138/79 | HR 59 | Temp 97.7°F | Resp 16 | Ht 64.5 in | Wt 169.0 lb

## 2024-02-02 DIAGNOSIS — Z0389 Encounter for observation for other suspected diseases and conditions ruled out: Secondary | ICD-10-CM | POA: Diagnosis not present

## 2024-02-02 DIAGNOSIS — K5150 Left sided colitis without complications: Secondary | ICD-10-CM | POA: Diagnosis not present

## 2024-02-02 DIAGNOSIS — E785 Hyperlipidemia, unspecified: Secondary | ICD-10-CM | POA: Diagnosis not present

## 2024-02-02 DIAGNOSIS — Z1211 Encounter for screening for malignant neoplasm of colon: Secondary | ICD-10-CM | POA: Diagnosis not present

## 2024-02-02 MED ORDER — SODIUM CHLORIDE 0.9 % IV SOLN: 500.0000 mL | Freq: Once | INTRAVENOUS | Status: DC

## 2024-02-02 NOTE — Progress Notes (Signed)
 GASTROENTEROLOGY PROCEDURE H&P NOTE   Primary Care Physician: Oris Camie BRAVO, NP    Reason for Procedure:  Chronic ulcerative colitis  Plan:    Colonoscopy  Patient is appropriate for endoscopic procedure(s) in the ambulatory ([facility]) setting.  The nature of the procedure, as well as the risks, benefits, and alternatives were carefully and thoroughly reviewed with the patient. Ample time for discussion and questions allowed.  All questions were answered. The patient understood, was satisfied, and agreed with the plan to proceed.    HPI: [name] is a 58 y.o. female who presents for colonoscopy.  Medical history as below.  Tolerated the prep.  No recent chest pain or shortness of breath.  No abdominal pain today.  Past Medical History:  Diagnosis Date   Acute hemorrhagic cystitis 11/26/2022   Acute non-recurrent pansinusitis 04/24/2021   Bladder pain 12/09/2022   DDD (degenerative disc disease), lumbar    S-I joints   Heart murmur    Hyperlipidemia    Superficial granulomatous pyoderma (HCC)    Ulcerative colitis     Past Surgical History:  Procedure Laterality Date   ABDOMINAL HERNIA REPAIR     age 27   COLONOSCOPY  05/12/2012   multiple    POLYPECTOMY     TONSILLECTOMY      Prior to Admission medications   Medication Sig Start Date End Date Taking? Authorizing Provider  Azelaic Acid  15 % gel Apply once nightly to entire face 11/25/23  Yes   buPROPion (WELLBUTRIN XL) 150 MG 24 hr tablet Take 1 tablet (150 mg total) by mouth daily. 12/15/23  Yes Early, Sara E, NP  metroNIDAZOLE  (METROCREAM ) 0.75 % cream Apply 1 Application topically 2 (two) times daily to face. 11/25/23  Yes   rosuvastatin  (CRESTOR ) 5 MG tablet Take 1 tablet (5 mg total) by mouth daily. 07/20/23  Yes Early, Sara E, NP  cholecalciferol (VITAMIN D3) 25 MCG (1000 UNIT) tablet Take 1,000 Units by mouth daily. Takes 2    [provider]  cyanocobalamin  (VITAMIN B12) 1000 MCG/ML injection Inject 1  mL (1,000 mcg total) into the muscle every 30 (thirty) days. Please include syringes and IM needles for injections. 07/07/23   Early, Sara E, NP  sertraline  (ZOLOFT ) 50 MG tablet Take 2 tablets (100 mg total) by mouth daily. 12/15/23   Early, Sara E, NP  Upadacitinib  ER (RINVOQ ) 30 MG TB24 Take 1 tablet  (30 mg) by mouth daily. 06/08/23   Jegede, Olugbemiga E, MD    Current Outpatient Medications  Medication Sig Dispense Refill   Azelaic Acid  15 % gel Apply once nightly to entire face 50 g 4   buPROPion (WELLBUTRIN XL) 150 MG 24 hr tablet Take 1 tablet (150 mg total) by mouth daily. 90 tablet 3   metroNIDAZOLE  (METROCREAM ) 0.75 % cream Apply 1 Application topically 2 (two) times daily to face. 45 g 2   rosuvastatin  (CRESTOR ) 5 MG tablet Take 1 tablet (5 mg total) by mouth daily. 90 tablet 3   cholecalciferol (VITAMIN D3) 25 MCG (1000 UNIT) tablet Take 1,000 Units by mouth daily. Takes 2     cyanocobalamin  (VITAMIN B12) 1000 MCG/ML injection Inject 1 mL (1,000 mcg total) into the muscle every 30 (thirty) days. Please include syringes and IM needles for injections. 3 mL 3   sertraline  (ZOLOFT ) 50 MG tablet Take 2 tablets (100 mg total) by mouth daily. 180 tablet 1   Upadacitinib  ER (RINVOQ ) 30 MG TB24 Take 1 tablet  (  30 mg) by mouth daily. 30 tablet 11   Current Facility-Administered Medications  Medication Dose Route Frequency Provider Last Rate Last Admin   0.9 %  sodium chloride  infusion  500 mL Intravenous Once Ellamarie Naeve, Gordy HERO, MD        Allergies as of 02/02/2024 - Review Complete 02/02/2024  Allergen Reaction Noted   Doxycycline  Rash 03/31/2016    Family History  Problem Relation Age of Onset   Macular degeneration Father    Lung cancer Father 47   Breast cancer Mother    Hyperlipidemia Mother    Glaucoma Mother    Colon cancer Neg Hx    Colon polyps Neg Hx    Esophageal cancer Neg Hx    Rectal cancer Neg Hx    Stomach cancer Neg Hx     Social History   Socioeconomic History    Marital status: Single    Spouse name: Not on file   Number of children: 2   Years of education: Not on file   Highest education level: Not on file  Occupational History   Occupation: Engineer, Civil (consulting), [employer]   Tobacco Use   Smoking status: Never    Passive exposure: Never   Smokeless tobacco: Never  Vaping Use   Vaping status: Never Used  Substance and Sexual Activity   Alcohol use: Yes    Comment: rare   Drug use: No   Sexual activity: Not on file  Other Topics Concern   Not on file  Social History Narrative   Not on file   Social Drivers of Health   Financial Resource Strain: Low Risk  (11/30/2022)   Overall Financial Resource Strain (CARDIA)    Difficulty of Paying Living Expenses: Not hard at all  Food Insecurity: No Food Insecurity (12/23/2022)   Hunger Vital Sign    Worried About Running Out of Food in the Last Year: Never true    Ran Out of Food in the Last Year: Never true  Transportation Needs: No Transportation Needs (11/30/2022)   PRAPARE - Administrator, Civil Service (Medical): No    Lack of Transportation (Non-Medical): No  Physical Activity: Insufficiently Active (11/30/2022)   Exercise Vital Sign    Days of Exercise per Week: 3 days    Minutes of Exercise per Session: 40 min  Stress: No Stress Concern Present (11/30/2022)   [employer] of Occupational Health - Occupational Stress Questionnaire    Feeling of Stress : Only a little  Social Connections: Moderately Isolated (11/30/2022)   Social Connection and Isolation Panel    Frequency of Communication with Friends and Family: More than three times a week    Frequency of Social Gatherings with Friends and Family: Once a week    Attends Religious Services: 1 to 4 times per year    Active Member of [employer] or Organizations: No    Attends Banker Meetings: Patient unable to answer    Marital Status: Divorced  Catering Manager Violence: Not At Risk (11/30/2022)   Humiliation,  Afraid, Rape, and Kick questionnaire    Fear of Current or Ex-Partner: No    Emotionally Abused: No    Physically Abused: No    Sexually Abused: No    Physical Exam: Vital signs in last 24 hours: @BP  137/79   Pulse 61   Temp 97.7 F (36.5 C) (Skin)   Ht 5' 4.5 (1.638 m)   Wt 169 lb (76.7 kg)   SpO2 98%  BMI 28.56 kg/m  GEN: NAD EYE: Sclerae anicteric ENT: MMM CV: Non-tachycardic Pulm: CTA b/l GI: Soft, NT/ND NEURO:  Alert & Oriented x 3   Gordy Starch, MD [facility]  02/02/2024 8:45 AM

## 2024-02-02 NOTE — Progress Notes (Signed)
 Called to room to assist during endoscopic procedure.  Patient ID and intended procedure confirmed with present staff. Received instructions for my participation in the procedure from the performing physician.

## 2024-02-02 NOTE — Patient Instructions (Signed)
   Resume previous diet  Continue present medications  Awaiting pathology results  YOU HAD AN ENDOSCOPIC PROCEDURE TODAY AT THE [facility]:   Refer to the procedure report that was given to you for any specific questions about what was found during the examination.  If the procedure report does not answer your questions, please call your gastroenterologist to clarify.  If you requested that your care partner not be given the details of your procedure findings, then the procedure report has been included in a sealed envelope for you to review at your convenience later.  YOU SHOULD EXPECT: Some feelings of bloating in the abdomen. Passage of more gas than usual.  Walking can help get rid of the air that was put into your GI tract during the procedure and reduce the bloating. If you had a lower endoscopy (such as a colonoscopy or flexible sigmoidoscopy) you may notice spotting of blood in your stool or on the toilet paper. If you underwent a bowel prep for your procedure, you may not have a normal bowel movement for a few days.  Please Note:  You might notice some irritation and congestion in your nose or some drainage.  This is from the oxygen used during your procedure.  There is no need for concern and it should clear up in a day or so.  SYMPTOMS TO REPORT IMMEDIATELY:  Following lower endoscopy (colonoscopy or flexible sigmoidoscopy):  Excessive amounts of blood in the stool  Significant tenderness or worsening of abdominal pains  Swelling of the abdomen that is new, acute  Fever of 100F or higher  For urgent or emergent issues, a gastroenterologist can be reached at any hour by calling [phone]. Do not use MyChart messaging for urgent concerns.    DIET:  We do recommend a small meal at first, but then you may proceed to your regular diet.  Drink plenty of fluids but you should avoid alcoholic beverages for 24 hours.  ACTIVITY:  You should plan to take it easy for the  rest of today and you should NOT DRIVE or use heavy machinery until tomorrow (because of the sedation medicines used during the test).    FOLLOW UP: Our staff will call the number listed on your records the next business day following your procedure.  We will call around 7:15- 8:00 am to check on you and address any questions or concerns that you may have regarding the information given to you following your procedure. If we do not reach you, we will leave a message.     If any biopsies were taken you will be contacted by phone or by letter within the next 1-3 weeks.  Please call us at [phone] if you have not heard about the biopsies in 3 weeks.    SIGNATURES/CONFIDENTIALITY: You and/or your care partner have signed paperwork which will be entered into your electronic medical record.  These signatures attest to the fact that that the information above on your After Visit Summary has been reviewed and is understood.  Full responsibility of the confidentiality of this discharge information lies with you and/or your care-partner.

## 2024-02-02 NOTE — Op Note (Signed)
 [facility] Patient Name: [name] Procedure Date: 02/02/2024 8:46 AM MRN: [id number] Endoscopist: Gordy CHRISTELLA Starch , MD, [id number] Age: 58 Referring MD:  Date of Birth: [birth year] Gender: Female Account #: [id number] Procedure:                Colonoscopy Indications:              High risk colon cancer surveillance: Ulcerative                            left sided colitis of 8 (or more) years duration,                            Last colonoscopy: October 2022; currently on Rinvoq                             30 mg daily with normal fecal calprotectin Medicines:                Monitored Anesthesia Care Procedure:                Pre-Anesthesia Assessment:                           - Prior to the procedure, a History and Physical                            was performed, and patient medications and                            allergies were reviewed. The patient's tolerance of                            previous anesthesia was also reviewed. The risks                            and benefits of the procedure and the sedation                            options and risks were discussed with the patient.                            All questions were answered, and informed consent                            was obtained. Prior Anticoagulants: The patient has                            taken no anticoagulant or antiplatelet agents. ASA                            Grade Assessment: II - A patient with mild systemic                            disease. After reviewing the risks and benefits,  the patient was deemed in satisfactory condition to                            undergo the procedure.                           After obtaining informed consent, the colonoscope                            was passed under direct vision. Throughout the                            procedure, the patient's blood pressure, pulse, and                            oxygen saturations  were monitored continuously. The                            Olympus Scope SN [id number] was introduced through the                            anus and advanced to the cecum, identified by                            appendiceal orifice and ileocecal valve. The                            colonoscopy was performed without difficulty. The                            patient tolerated the procedure well. The quality                            of the bowel preparation was good. The ileocecal                            valve, appendiceal orifice, and rectum were                            photographed. Scope In: 8:57:12 AM Scope Out: 9:11:05 AM Scope Withdrawal Time: 0 hours 11 minutes 2 seconds  Total Procedure Duration: 0 hours 13 minutes 53 seconds  Findings:                 The digital rectal exam was normal.                           Inflammation was not found based on the endoscopic                            appearance of the mucosa in the colon. This was                            graded as Mayo Score 0 (normal or inactive  disease), and when compared to the previous                            examination, the findings are in remission. Four                            biopsies were taken every 10 cm with a cold forceps                            from the right colon, transverse colon, descending                            colon, sigmoid colon and rectum for ulcerative                            colitis surveillance. These biopsy specimens from                            the right colon and left colon were sent to                            Pathology.                           The retroflexed view of the distal rectum and anal                            verge was normal and showed no anal or rectal                            abnormalities. Complications:            No immediate complications. Estimated Blood Loss:     Estimated blood loss: none. Impression:                - Inactive (Mayo Score 0) left-sided ulcerative                            colitis, in remission since the last examination.                            Biopsied.                           - The distal rectum and anal verge are normal on                            retroflexion view. Recommendation:           - Patient has a contact number available for                            emergencies. The signs and symptoms of potential                            delayed complications  were discussed with the                            patient. Return to normal activities tomorrow.                            Written discharge instructions were provided to the                            patient.                           - Resume previous diet.                           - Continue present medications.                           - Await pathology results.                           - Repeat colonoscopy is recommended for                            surveillance. The colonoscopy date will be                            determined after pathology results from today's                            exam become available for review. Gordy CHRISTELLA Starch, MD 02/02/2024 9:15:53 AM This report has been signed electronically.

## 2024-02-02 NOTE — Progress Notes (Signed)
 Sedate, gd SR, tolerated procedure well, VSS, report to RN

## 2024-02-03 ENCOUNTER — Telehealth: Payer: Self-pay | Admitting: *Deleted

## 2024-02-03 NOTE — Telephone Encounter (Signed)
 Post procedure follow up call placed, no answer and left VM.

## 2024-02-04 ENCOUNTER — Other Ambulatory Visit: Payer: Self-pay

## 2024-02-04 LAB — SURGICAL PATHOLOGY

## 2024-02-04 NOTE — Progress Notes (Signed)
 Specialty Pharmacy Ongoing Clinical Assessment Note  [name] is a 58 y.o. female who is being followed by the specialty pharmacy service for RxSp Ulcerative Colitis   Patient's specialty medication(s) reviewed today: Upadacitinib  (Rinvoq )   Missed doses in the last 4 weeks: 0   Patient/Caregiver did not have any additional questions or concerns.   Therapeutic benefit summary: Patient is achieving benefit   Adverse events/side effects summary: No adverse events/side effects   Patient's therapy is appropriate to: Continue    Goals Addressed             This Visit's Progress    Minimize recurrence of flares   On track    Patient is on track. Patient will maintain adherence.  Patient reports that her UC is well-controlled at this time.          Follow up: 12 months  Delon [name] Specialty Pharmacist

## 2024-02-07 ENCOUNTER — Ambulatory Visit: Payer: Self-pay | Admitting: Internal Medicine

## 2024-02-08 ENCOUNTER — Other Ambulatory Visit

## 2024-02-09 LAB — LIPID PANEL+APOB
Apolipoprotein B: 82 mg/dL (ref <90)
Cholesterol, Total: 188 mg/dL (ref 100–199)
HDL-C: 71 mg/dL (ref >39)
LDL-C (NIH Calc): 101 mg/dL — ABNORMAL HIGH (ref 0–99)
Non-HDL Cholesterol: 117 mg/dL (ref 0–129)
Triglycerides: 91 mg/dL (ref 0–149)

## 2024-02-09 LAB — LIPOPROTEIN A (LPA): Lipoprotein (a): 193.5 nmol/L — ABNORMAL HIGH (ref <75.0)

## 2024-02-11 ENCOUNTER — Other Ambulatory Visit (HOSPITAL_COMMUNITY): Payer: Self-pay

## 2024-02-16 ENCOUNTER — Other Ambulatory Visit: Payer: Self-pay

## 2024-02-17 ENCOUNTER — Other Ambulatory Visit: Payer: Self-pay | Admitting: Pharmacy Technician

## 2024-02-17 ENCOUNTER — Other Ambulatory Visit: Payer: Self-pay

## 2024-02-17 NOTE — Progress Notes (Signed)
 Specialty Pharmacy Refill Coordination Note  [name] is a 58 y.o. female contacted today regarding refills of specialty medication(s)Upadacitinib  (Rinvoq )    Patient requested (Patient-Rptd) Pickup at [facility] date: (Patient-Rptd) 02/23/24   Medication will be filled on: 02/22/2024

## 2024-02-21 ENCOUNTER — Ambulatory Visit: Payer: Self-pay | Admitting: Nurse Practitioner

## 2024-02-22 ENCOUNTER — Other Ambulatory Visit: Payer: Self-pay

## 2024-02-25 ENCOUNTER — Ambulatory Visit
Admission: RE | Admit: 2024-02-25 | Discharge: 2024-02-25 | Disposition: A | Source: Ambulatory Visit | Attending: Obstetrics and Gynecology | Admitting: Obstetrics and Gynecology

## 2024-02-25 DIAGNOSIS — Z1231 Encounter for screening mammogram for malignant neoplasm of breast: Secondary | ICD-10-CM

## 2024-03-03 ENCOUNTER — Other Ambulatory Visit (HOSPITAL_COMMUNITY): Payer: Self-pay

## 2024-03-03 ENCOUNTER — Other Ambulatory Visit: Payer: Self-pay

## 2024-03-06 ENCOUNTER — Other Ambulatory Visit: Payer: Self-pay

## 2024-03-06 ENCOUNTER — Other Ambulatory Visit (HOSPITAL_COMMUNITY): Payer: Self-pay

## 2024-03-10 ENCOUNTER — Other Ambulatory Visit (HOSPITAL_COMMUNITY): Payer: Self-pay

## 2024-03-15 ENCOUNTER — Other Ambulatory Visit: Payer: Self-pay

## 2024-03-17 ENCOUNTER — Other Ambulatory Visit: Payer: Self-pay

## 2024-03-17 ENCOUNTER — Other Ambulatory Visit (HOSPITAL_COMMUNITY): Payer: Self-pay

## 2024-03-17 ENCOUNTER — Encounter: Payer: Self-pay | Admitting: Nurse Practitioner

## 2024-03-17 MED ORDER — OSELTAMIVIR PHOSPHATE 75 MG PO CAPS
75.0000 mg | ORAL_CAPSULE | Freq: Two times a day (BID) | ORAL | 0 refills | Status: AC
  Filled 2024-03-17: qty 10, 5d supply, fill #0

## 2024-03-17 NOTE — Progress Notes (Signed)
 Specialty Pharmacy Refill Coordination Note  [name] is a 59 y.o. female contacted today regarding refills of specialty medication(s) Upadacitinib  (Rinvoq )   Patient requested [name] at [facility] at [place] date: 03/22/24   Medication will be filled on: 03/21/24

## 2024-03-21 ENCOUNTER — Other Ambulatory Visit: Payer: Self-pay

## 2024-03-21 ENCOUNTER — Other Ambulatory Visit (HOSPITAL_COMMUNITY): Payer: Self-pay

## 2024-03-22 ENCOUNTER — Other Ambulatory Visit (HOSPITAL_COMMUNITY): Payer: Self-pay

## 2024-04-14 ENCOUNTER — Other Ambulatory Visit: Payer: Self-pay

## 2024-04-14 NOTE — Progress Notes (Signed)
 Specialty Pharmacy Refill Coordination Note  [name] is a 59 y.o. female contacted today regarding refills of specialty medication(s) Upadacitinib  (Rinvoq )   Patient requested [name] at [facility] at [place] date: 04/19/24   Medication will be filled on: 04/18/24

## 2024-04-18 ENCOUNTER — Other Ambulatory Visit: Payer: Self-pay

## 2024-06-14 ENCOUNTER — Ambulatory Visit: Payer: Self-pay | Admitting: Nurse Practitioner

## 2025-01-03 ENCOUNTER — Encounter: Admitting: Nurse Practitioner
